# Patient Record
Sex: Male | Born: 1945 | Race: White | Hispanic: No | Marital: Married | Smoking: Former smoker
Health system: Southern US, Community
[De-identification: ages and names within clinical notes are randomized; demographics above are authoritative.]

## PROBLEM LIST (undated history)

## (undated) DIAGNOSIS — C7800 Secondary malignant neoplasm of unspecified lung: Secondary | ICD-10-CM

## (undated) DIAGNOSIS — C801 Malignant (primary) neoplasm, unspecified: Secondary | ICD-10-CM

## (undated) DIAGNOSIS — I10 Essential (primary) hypertension: Secondary | ICD-10-CM

## (undated) DIAGNOSIS — E119 Type 2 diabetes mellitus without complications: Secondary | ICD-10-CM

## (undated) DIAGNOSIS — R569 Unspecified convulsions: Secondary | ICD-10-CM

## (undated) DIAGNOSIS — I2510 Atherosclerotic heart disease of native coronary artery without angina pectoris: Secondary | ICD-10-CM

## (undated) DIAGNOSIS — M1990 Unspecified osteoarthritis, unspecified site: Secondary | ICD-10-CM

## (undated) DIAGNOSIS — Z923 Personal history of irradiation: Secondary | ICD-10-CM

## (undated) DIAGNOSIS — C649 Malignant neoplasm of unspecified kidney, except renal pelvis: Secondary | ICD-10-CM

## (undated) DIAGNOSIS — E785 Hyperlipidemia, unspecified: Secondary | ICD-10-CM

## (undated) DIAGNOSIS — E7800 Pure hypercholesterolemia, unspecified: Secondary | ICD-10-CM

## (undated) DIAGNOSIS — C7931 Secondary malignant neoplasm of brain: Secondary | ICD-10-CM

## (undated) DIAGNOSIS — R180 Malignant ascites: Secondary | ICD-10-CM

## (undated) HISTORY — PX: KNEE SURGERY: SHX244

## (undated) HISTORY — DX: Pure hypercholesterolemia, unspecified: E78.00

## (undated) HISTORY — DX: Unspecified convulsions: R56.9

## (undated) HISTORY — DX: Atherosclerotic heart disease of native coronary artery without angina pectoris: I25.10

## (undated) HISTORY — DX: Secondary malignant neoplasm of brain: C79.31

## (undated) HISTORY — DX: Malignant ascites: R18.0

## (undated) HISTORY — DX: Unspecified osteoarthritis, unspecified site: M19.90

## (undated) HISTORY — DX: Essential (primary) hypertension: I10

## (undated) HISTORY — DX: Secondary malignant neoplasm of unspecified lung: C78.00

## (undated) HISTORY — PX: HEMORROIDECTOMY: SUR656

## (undated) HISTORY — DX: Malignant neoplasm of unspecified kidney, except renal pelvis: C64.9

---

## 1999-03-26 ENCOUNTER — Encounter: Payer: Self-pay | Admitting: *Deleted

## 1999-03-26 ENCOUNTER — Emergency Department (HOSPITAL_COMMUNITY): Admission: EM | Admit: 1999-03-26 | Discharge: 1999-03-26 | Payer: Self-pay | Admitting: *Deleted

## 1999-05-14 ENCOUNTER — Encounter: Admission: RE | Admit: 1999-05-14 | Discharge: 1999-05-14 | Payer: Self-pay | Admitting: Neurology

## 2001-02-16 ENCOUNTER — Ambulatory Visit (HOSPITAL_COMMUNITY): Admission: RE | Admit: 2001-02-16 | Discharge: 2001-02-16 | Payer: Self-pay | Admitting: *Deleted

## 2001-02-16 ENCOUNTER — Encounter (INDEPENDENT_AMBULATORY_CARE_PROVIDER_SITE_OTHER): Payer: Self-pay | Admitting: *Deleted

## 2001-09-27 ENCOUNTER — Encounter: Payer: Self-pay | Admitting: *Deleted

## 2001-09-27 ENCOUNTER — Encounter: Admission: RE | Admit: 2001-09-27 | Discharge: 2001-09-27 | Payer: Self-pay | Admitting: *Deleted

## 2001-09-30 ENCOUNTER — Inpatient Hospital Stay (HOSPITAL_COMMUNITY): Admission: EM | Admit: 2001-09-30 | Discharge: 2001-10-05 | Payer: Self-pay | Admitting: *Deleted

## 2001-10-02 HISTORY — PX: CARDIAC CATHETERIZATION: SHX172

## 2001-10-19 ENCOUNTER — Encounter (HOSPITAL_COMMUNITY): Admission: RE | Admit: 2001-10-19 | Discharge: 2002-01-16 | Payer: Self-pay | Admitting: Cardiology

## 2002-01-17 ENCOUNTER — Encounter (HOSPITAL_COMMUNITY): Admission: RE | Admit: 2002-01-17 | Discharge: 2002-04-17 | Payer: Self-pay | Admitting: Cardiology

## 2002-05-04 ENCOUNTER — Encounter (HOSPITAL_COMMUNITY): Admission: RE | Admit: 2002-05-04 | Discharge: 2002-08-02 | Payer: Self-pay | Admitting: Cardiology

## 2002-08-04 ENCOUNTER — Encounter (HOSPITAL_COMMUNITY): Admission: RE | Admit: 2002-08-04 | Discharge: 2002-11-02 | Payer: Self-pay | Admitting: Cardiology

## 2002-11-03 ENCOUNTER — Encounter (HOSPITAL_COMMUNITY): Admission: RE | Admit: 2002-11-03 | Discharge: 2003-02-01 | Payer: Self-pay | Admitting: Cardiology

## 2003-02-02 ENCOUNTER — Encounter (HOSPITAL_COMMUNITY): Admission: RE | Admit: 2003-02-02 | Discharge: 2003-05-03 | Payer: Self-pay | Admitting: Cardiology

## 2003-05-05 ENCOUNTER — Encounter (HOSPITAL_COMMUNITY): Admission: RE | Admit: 2003-05-05 | Discharge: 2003-08-03 | Payer: Self-pay | Admitting: Cardiology

## 2003-08-05 ENCOUNTER — Encounter (HOSPITAL_COMMUNITY): Admission: RE | Admit: 2003-08-05 | Discharge: 2003-11-03 | Payer: Self-pay | Admitting: Cardiology

## 2003-11-04 ENCOUNTER — Encounter (HOSPITAL_COMMUNITY): Admission: RE | Admit: 2003-11-04 | Discharge: 2004-02-02 | Payer: Self-pay | Admitting: Cardiology

## 2004-02-06 ENCOUNTER — Encounter (HOSPITAL_COMMUNITY): Admission: RE | Admit: 2004-02-06 | Discharge: 2004-05-06 | Payer: Self-pay | Admitting: Cardiology

## 2004-05-07 ENCOUNTER — Encounter (HOSPITAL_COMMUNITY): Admission: RE | Admit: 2004-05-07 | Discharge: 2004-08-05 | Payer: Self-pay | Admitting: Cardiology

## 2004-08-06 ENCOUNTER — Encounter (HOSPITAL_COMMUNITY): Admission: RE | Admit: 2004-08-06 | Discharge: 2004-11-04 | Payer: Self-pay | Admitting: Cardiology

## 2004-11-05 ENCOUNTER — Encounter (HOSPITAL_COMMUNITY): Admission: RE | Admit: 2004-11-05 | Discharge: 2005-02-03 | Payer: Self-pay | Admitting: Cardiology

## 2005-02-04 ENCOUNTER — Encounter (HOSPITAL_COMMUNITY): Admission: RE | Admit: 2005-02-04 | Discharge: 2005-05-02 | Payer: Self-pay | Admitting: Cardiology

## 2005-05-04 ENCOUNTER — Encounter (HOSPITAL_COMMUNITY): Admission: RE | Admit: 2005-05-04 | Discharge: 2005-08-02 | Payer: Self-pay | Admitting: Cardiology

## 2005-08-05 ENCOUNTER — Encounter (HOSPITAL_COMMUNITY): Admission: RE | Admit: 2005-08-05 | Discharge: 2005-11-03 | Payer: Self-pay | Admitting: Cardiology

## 2005-11-04 ENCOUNTER — Encounter (HOSPITAL_COMMUNITY): Admission: RE | Admit: 2005-11-04 | Discharge: 2006-02-02 | Payer: Self-pay | Admitting: Cardiology

## 2006-02-03 ENCOUNTER — Encounter (HOSPITAL_COMMUNITY): Admission: RE | Admit: 2006-02-03 | Discharge: 2006-05-04 | Payer: Self-pay | Admitting: Cardiology

## 2006-05-05 ENCOUNTER — Encounter: Admission: RE | Admit: 2006-05-05 | Discharge: 2006-08-03 | Payer: Self-pay | Admitting: Cardiology

## 2006-08-04 ENCOUNTER — Encounter (HOSPITAL_COMMUNITY): Admission: RE | Admit: 2006-08-04 | Discharge: 2006-11-02 | Payer: Self-pay | Admitting: Cardiology

## 2006-11-03 ENCOUNTER — Encounter (HOSPITAL_COMMUNITY): Admission: RE | Admit: 2006-11-03 | Discharge: 2007-02-01 | Payer: Self-pay | Admitting: Cardiology

## 2007-01-27 ENCOUNTER — Encounter: Admission: RE | Admit: 2007-01-27 | Discharge: 2007-04-14 | Payer: Self-pay | Admitting: Family Medicine

## 2007-03-29 ENCOUNTER — Ambulatory Visit (HOSPITAL_BASED_OUTPATIENT_CLINIC_OR_DEPARTMENT_OTHER): Admission: RE | Admit: 2007-03-29 | Discharge: 2007-03-29 | Payer: Self-pay | Admitting: Orthopedic Surgery

## 2007-05-05 ENCOUNTER — Encounter (HOSPITAL_COMMUNITY): Admission: RE | Admit: 2007-05-05 | Discharge: 2007-08-03 | Payer: Self-pay | Admitting: Pediatrics

## 2007-08-05 ENCOUNTER — Encounter (HOSPITAL_COMMUNITY): Admission: RE | Admit: 2007-08-05 | Discharge: 2007-10-05 | Payer: Self-pay | Admitting: Cardiology

## 2010-12-17 NOTE — Op Note (Signed)
NAME:  George [name], George [name]                ACCOUNT NO.:  [id number]   MEDICAL RECORD NO.:  [id number]          PATIENT TYPE:  AMB   LOCATION:  DSC                          FACILITY:  [facility]   PHYSICIAN:  Robert A. Thurston Hole, M.D. 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   PROCEDURES:  1. Left knee examination under anesthesia, followed by arthroscopic      partial medial and lateral meniscectomies.  2. Left knee chondroplasty.   SURGEON:  Elana Alm. Thurston Hole, M.D.   ASSISTANT:  Julien Girt, P.A.   ANESTHESIA:  Local with MAC.   OPERATIVE TIME:  30 minutes.   COMPLICATIONS:  None.   INDICATION FOR PROCEDURE:  [name] is a 65 year old gentleman who has  had 3-4 months of increasing left knee pain with exam and MRI  documenting meniscal tearing with chondromalacia.  He has failed  conservative care and is now to undergo arthroscopy.   DESCRIPTION:  [name] was brought to operating room on March 29, 2007, after a knee block was placed in the holding room by anesthesia.  He was placed on the operating room table in supine position.  His left  knee was examined.  He had full range of motion.  His knee was stable to  ligamentous exam.  His left leg was prepped using sterile DuraPrep and  draped using sterile technique.  Originally through an anterolateral  portal the arthroscope with a pump attached was placed into an  anteromedial portal and an arthroscopic probe was placed.  On initial  inspection of the medial compartment, he was found to have a 60-70%  grade 3 chondromalacia on the medial femoral condyle, which was  debrided.  Medial tibial plateau was intact.  Medial meniscus tear,  posterior and medial  horn, of which 30-40% was resected back to a stable  rim.  Intercondylar notch inspected.  Anterior and posterior cruciate  ligaments were normal.  Lateral compartment inspected.  Articular  cartilage was normal.  Lateral meniscus showed a small posterior medial  root tear of 20%, which was resected back to a stable rim.  Patellofemoral joint showed grade 1 and 2 chondromalacia.  The patella  tracked normally.  Medial and lateral gutters were free of pathology.  After this was done, it was felt that all pathology been satisfactorily  addressed.  Instruments were removed.  Portals were closed with 3-0  nylon suture and injected with 0.25% Marcaine with epinephrine and 4 mg  of morphine.  Sterile dressings were applied and the patient awakened  and taken to the recovery room in stable condition.   FOLLOW-UP CARE:  [name] will be followed as an outpatient on Vicodin  and Mobic.  See him back in the office in a week for sutures out and  follow-up.      Robert A. Thurston Hole,  M.D.  Electronically Signed     RAW/MEDQ  D:  03/29/2007  T:  03/30/2007  Job:  [id number]

## 2010-12-20 NOTE — Discharge Summary (Signed)
[place]  Patient:    George [name], George [name] Visit Number: [id number] MRN: [id number]          Service Type: MED Location: [phone] Attending Physician:  Armanda Magic Dictated by:   Anselm Lis, N.P. Admit Date:  09/30/2001 Discharge Date: 10/05/2001   CC:         Lester Kinsman, M.D.   Discharge Summary  DATE OF BIRTH:  1946/03/02  PROCEDURES: 1. (October 04, 3001) Coronary angiography by Dr. Armanda Magic revealing distal     left anterior descending 60%, a 100% mid occluded small diagonal-1, 99%     proximal large obtuse marginal with luminal irregularities up to 40% mid     circumflex, 80-90% distal right coronary artery.  Ejection fraction low     normal at 50% with mild hypokinesis of the interior lateral wall. 2. (October 04, 2001) Stent obtuse marginal and stent to distal right coronary    artery by Dr. Verdis Prime. 3. Stress Cardiolite (October 01, 2001) revealing large area of ischemia in    the inferolateral wall with some associated scar formation particularly in    the anterolateral wall.  Ejection fraction 47%.  DISCHARGE DIAGNOSES: 1. Coronary atherosclerotic heart disease; two vessel.    a. Coronary angiography by Dr. Armanda Magic with results detailed above.    b. Percutaneous transluminal coronary angioplasty/stent of both the obtuse       marginal and distal right coronary artery by Dr. Verdis Prime.    c. Ruled out myocardial infarction by serial cardiac enzymes, although       slightly elevated troponin I of 0.11 to 0.12.    d. The patients angina is left arm numbness and left anterior shoulder       discomfort. 2. Diabetes mellitus has been diet controlled, diagnosed approximately nine    years earlier.  His hemoglobin A1C was elevated at 9.5.  Fasting glucoses    319 and 254.  Blood glucoses ranging from 153-70 covered by sliding scale    this admission.  He has already been through diabetic education classes in    the past and defers further referral.  He will follow up with    Dr. Ulyess Mort in the next couple of days. 3. Hyperlipidemia in the setting of coronary artery disease.  Cholesterol    fasting of 242 with triglycerides 224, high-density lipoprotein 35,    low-density lipoprotein 162.  He was started on Zocor 20 mg p.o. q.h.s. and    will have fasting Statin profile in approximately six weeks at our clinic. 4. Left ventricular dysfunction; ischemic.  Ejection fraction low normal at    about 50%.  Preference would be to start on angiotensin-converting enzyme    inhibitor in the setting of ejection fraction and diabetes, but his blood    pressure has been borderline low, so we will defer on this until blood    pressure improves.  Will similarly not start beta-blocker secondary to low    heart rate in the 50s and 60s. 5. Ischemic left ventricular dysfunction with left ventriculogram at the time    of heart catheterization revealing ejection fraction approximately 50% with    mild hypokinesis of the anterolateral wall. 6. Left shoulder pain.  Shoulder x-ray films suggest etiology of bone spur. 7. Ongoing tobacco abuse.  He was counseled to stop smoking.  Uncertain as to    his commitment to this. 8. Sinus bradycardia, nocturnal, with pause  as long as 2.04 to 3.12 seconds.    The patient was asymptomatic.  It appeared like a sinus rush with an atrial    pacemaker escape rhythm.  CONDITION ON DISCHARGE:  The patient was discharged to home in stable condition.  DISCHARGE MEDICATIONS: 1. (New) Zocor 20 mg p.o. q.h.s. 2. Enteric coated aspirin 325 mg once daily. 3. (New) Plavix 75 mg one p.o. q.d. x4 weeks, take with food. 4. (New) Nitroglycerin tablets 0.4 mg sublingual p.r.n. chest pain up to three    tablets in 15 minutes.  ACTIVITY:  As outlined by cardiac rehabilitation phase I.  Anticipate enrollment in cardiac rehabilitation phase II as an outpatient.  No heavy lifting or pushing, no  driving today or tomorrow and then activity as before.  DIET:  Low fat, low cholesterol, no added salt recommended.  WOUND CARE:  The patient may shower.  He is to call our clinic if he develops a large amount of swelling or bruising in groin area.  FOLLOWUP:  Follow up with Dr. Ulyess Mort, phone [phone], at 9 a.m. on March 6, this Thursday.  Follow up with Dr. Armanda Magic on Tuesday, March 18, at 11:45 a.m.  Fasting Statin profile on Tuesday, April 15, between 8 a.m. and noon, third floor, [address] at our office.  HISTORY OF PRESENT ILLNESS:  Mr. George [name] is a very pleasant, 65 year old gentleman with history of diet-controlled diabetes mellitus, ongoing tobacco abuse who presented to the hospital with complaints of left shoulder pain, left anterior chest discomfort and left arm numbness.  He was admitted and his first troponin I was elevated at 0.11, although CK-MB were negative serially. His amylase and lipase were in the normal range.  His admission EKG was nonischemic.  He underwent stress Cardiolite significant for reproduction of the left anterior shoulder discomfort and left arm numbness with exertion with associated EKG changes of ischemia in lateral/precordial leads.  Discomfort was relieved with rest and two sublingual nitroglycerin.  The patient is counseled to undergo coronary angiography which he agreed to.  He had to stay over the weekend and physically underwent coronary angiography on Monday, March 3, with results as detailed above.  Ultimately, PTCA/stent in both the OM and distal RCA.  The rest of the hospital course was uneventful and he was discharged to home in stable condition.  LABORATORY DATA AND X-RAY FINDINGS:  Admission WBC 6.0, hemoglobin 16.7, hematocrit 47.8, differential all within normal range, platelets 180.  ESR of 2.  Protime 13.2, INR 1, PTT of 30.  Sodium 137, potassium 4.1, chloride 104,  CO2 25, glucose 319, BUN 15, creatinine 0.6.  LFTs  all within normal limits as were the amylase and lipase.  Hemoglobin A1C elevated at 9.5.  First CK of 36, MB fraction 1.6, troponin I of 0.11.  Second CK of 54, MB fraction 1.2, troponin I 0.12.  Third CK 38, MB fraction 1.0.  Fasting Statin profile with cholesterol 242, triglycerides 224, HDL 35, LDL 162.  Initial portable chest x-ray revealed a 10 x 5 mm right upper lobe nodule suspicious for calcified granuloma.  A followup PA and lateral chest x-ray revealed an apparently calcified right upper lobe nodular density at 7 x 3 mm granuloma.  Thoracic spine degenerative changes again demonstrated.  Two-view shoulder films revealed mild to moderate inferior clavicular spur formation at the acromioclavicular joint.  This could be causing underlying rotator cuff impingement.  Total time of this dictation was greater than 45 minutes including filling  out and reviewing discharge instructions with patient and dictating the discharge summary as well as calling in prescriptions. Dictated by:   Anselm Lis, N.P. Attending Physician:  Armanda Magic DD:  10/05/01 TD:  10/06/01 Job: [id number]

## 2010-12-20 NOTE — Cardiovascular Report (Signed)
. [facility]  Patient:    George [name], George [name] Visit Number: [id number] MRN: [id number]          Service Type: MED Location: 6500 [phone] Attending Physician:  Armanda Magic Dictated by:   Armanda Magic, M.D. Proc. Date: 10/04/01 Admit Date:  09/30/2001   CC:         Lester Kinsman, M.D.   Cardiac Catheterization  REFERRING PHYSICIAN:  Lester Kinsman, M.D.  CHIEF COMPLAINT:  This is a very pleasant 65 year old white male who presented to the hospital with complaints of shoulder pain over the past year but has been increasing in severity recently.  He also had some chest pain over the past several days.  Troponins were slightly elevated.  CPK-MB was normal. Stress Cardiolite study showed a large area of inducible ischemia in the inferolateral wall.  He now presents for cardiac catheterization.  PROCEDURE:  Left heart catheterization, coronary angiography, left ventriculography.  OPERATOR:  Armanda Magic, M.D.  INDICATIONS:  Chest pain, left arm pain, abnormal Cardiolite.  COMPLICATIONS:  None.  IV ACCESS:  Via right femoral artery, #6 French sheath.  DESCRIPTION OF PROCEDURE:  The patient was brought to the cardiac catheterization laboratory in the fasting nonsedated state.  Informed consent was obtained.  The patient was connected to continuous heart rate and pulse oximetry monitoring and intermittent blood pressure monitoring.  The right groin was prepped and draped in a sterile fashion.  Xylocaine, 1%, was used for local anesthesia.  Using the modified Seldinger technique, a #6 French sheath was placed in the right femoral artery.  Under fluoroscopic guidance, a #6 [place] JR4 catheter was placed in the left coronary artery.  Multiple cine films were taken in a 30-degree RAO, 40-degree LAO view.  This catheter was then exchanged out for a #6 [place] JR4 catheter which was placed in the right coronary artery under fluoroscopic guidance.   Multiple cine films were taken in a 30-degree RAO, 40-degree LAO view.  This catheter was then exchanged out over a guidewire for a #6 French angled pigtail catheter which was placed under fluoroscopic guidance in the left ventricular cavity.  Left ventriculography was performed in the 30-degree RAO view with a total of 30 cc of contrast at 13 cc per second.  At the end of the procedure, the patient went on to PCI of the distal right coronary artery and the obtuse marginal branch.  RESULTS: 1. The left main coronary artery is widely patent and bifurcates into a left    anterior descending artery and left circumflex artery.  2. The left anterior descending artery gives rise to a first diagonal which is    occluded proximally.  It gives rise to a second diagonal which is widely    patent.  The LAD is widely patent throughout its course until distally.  It    tapers into a 60% narrowing.  3. The left circumflex is patent throughout the course with luminal    irregularities up to 40% in the mid and distal portion of the vessel.    There is a very large obtuse marginal that comes off which is a very large    branch and has a 90% proximal stenosis prior to bifurcating into two    daughter vessels of which are widely patent.  Of note, the posterolateral    artery comes off the distal left circumflex.  4. The right coronary artery is widely patent throughout its course and    distally has  an 80-90% stenosis prior to terminating in the posterior    descending artery.  LEFT VENTRICULOGRAPHY:  Left ventriculography performed in the 30-degree RAO view using a total of 30 cc of contrast at 13 cc per second showed mild LV dysfunction.  There was mild hypokinesis of the anterolateral wall.  EF approximately 50%.  HEMODYNAMIC DATA: 1. Aortic pressure 106/60 mmHg. 2. Left ventricular pressure was 107/20 mmHg.  ASSESSMENT: 1. Two-vessel obstructive coronary disease. 2. Mild anterolateral  ischemia. 3. Low normal ejection fraction of 50%. 4. Hyperlipidemia with an LDL of 162 and HDL 35.  Patient was started on Zocor    in the hospital. 5. Diabetes mellitus, questionably diet-controlled although blood sugar was    300 when he came in.  PLAN:  PCI per Dr. Katrinka Blazing of the distal right coronary artery and obtuse marginal branch.  Continue aspirin and Plavix.  Continue Zocor and check a hemoglobin A1C. Dictated by:   Armanda Magic, M.D. Attending Physician:  Armanda Magic DD:  10/04/01 TD:  10/04/01 Job: [id number]

## 2011-05-16 LAB — I-STAT 8, (EC8 V) (CONVERTED LAB)
Chloride: 106
Glucose, Bld: 153 — ABNORMAL HIGH
HCT: 46
Potassium: 4.2
Sodium: 140
TCO2: 27
pH, Ven: 7.386 — ABNORMAL HIGH

## 2013-03-24 ENCOUNTER — Other Ambulatory Visit: Payer: Self-pay | Admitting: Cardiology

## 2013-03-28 ENCOUNTER — Emergency Department (HOSPITAL_COMMUNITY): Payer: Medicare Other

## 2013-03-28 ENCOUNTER — Inpatient Hospital Stay (HOSPITAL_COMMUNITY)
Admission: EM | Admit: 2013-03-28 | Discharge: 2013-04-05 | DRG: 054 | Disposition: A | Discharge status: Home / Self Care | Payer: Medicare Other | Attending: Internal Medicine | Admitting: Internal Medicine

## 2013-03-28 ENCOUNTER — Encounter (HOSPITAL_COMMUNITY): Payer: Self-pay | Admitting: *Deleted

## 2013-03-28 DIAGNOSIS — N2889 Other specified disorders of kidney and ureter: Secondary | ICD-10-CM | POA: Diagnosis present

## 2013-03-28 DIAGNOSIS — I639 Cerebral infarction, unspecified: Secondary | ICD-10-CM

## 2013-03-28 DIAGNOSIS — I6350 Cerebral infarction due to unspecified occlusion or stenosis of unspecified cerebral artery: Secondary | ICD-10-CM

## 2013-03-28 DIAGNOSIS — E785 Hyperlipidemia, unspecified: Secondary | ICD-10-CM | POA: Diagnosis present

## 2013-03-28 DIAGNOSIS — R4182 Altered mental status, unspecified: Secondary | ICD-10-CM

## 2013-03-28 DIAGNOSIS — Z9861 Coronary angioplasty status: Secondary | ICD-10-CM

## 2013-03-28 DIAGNOSIS — G9340 Encephalopathy, unspecified: Secondary | ICD-10-CM | POA: Diagnosis present

## 2013-03-28 DIAGNOSIS — R569 Unspecified convulsions: Secondary | ICD-10-CM

## 2013-03-28 DIAGNOSIS — R32 Unspecified urinary incontinence: Secondary | ICD-10-CM | POA: Diagnosis present

## 2013-03-28 DIAGNOSIS — IMO0002 Reserved for concepts with insufficient information to code with codable children: Secondary | ICD-10-CM | POA: Diagnosis present

## 2013-03-28 DIAGNOSIS — I2510 Atherosclerotic heart disease of native coronary artery without angina pectoris: Secondary | ICD-10-CM | POA: Diagnosis present

## 2013-03-28 DIAGNOSIS — C649 Malignant neoplasm of unspecified kidney, except renal pelvis: Secondary | ICD-10-CM | POA: Diagnosis present

## 2013-03-28 DIAGNOSIS — C7800 Secondary malignant neoplasm of unspecified lung: Secondary | ICD-10-CM | POA: Diagnosis present

## 2013-03-28 DIAGNOSIS — C778 Secondary and unspecified malignant neoplasm of lymph nodes of multiple regions: Secondary | ICD-10-CM | POA: Diagnosis present

## 2013-03-28 DIAGNOSIS — W07XXXA Fall from chair, initial encounter: Secondary | ICD-10-CM | POA: Diagnosis present

## 2013-03-28 DIAGNOSIS — I214 Non-ST elevation (NSTEMI) myocardial infarction: Secondary | ICD-10-CM | POA: Diagnosis present

## 2013-03-28 DIAGNOSIS — C7931 Secondary malignant neoplasm of brain: Principal | ICD-10-CM

## 2013-03-28 DIAGNOSIS — E119 Type 2 diabetes mellitus without complications: Secondary | ICD-10-CM | POA: Diagnosis present

## 2013-03-28 DIAGNOSIS — G936 Cerebral edema: Secondary | ICD-10-CM | POA: Diagnosis present

## 2013-03-28 HISTORY — DX: Type 2 diabetes mellitus without complications: E11.9

## 2013-03-28 HISTORY — DX: Atherosclerotic heart disease of native coronary artery without angina pectoris: I25.10

## 2013-03-28 HISTORY — DX: Hyperlipidemia, unspecified: E78.5

## 2013-03-28 LAB — COMPREHENSIVE METABOLIC PANEL
ALT: 19 U/L (ref 0–53)
AST: 17 U/L (ref 0–37)
Alkaline Phosphatase: 96 U/L (ref 39–117)
CO2: 25 mEq/L (ref 19–32)
Chloride: 102 mEq/L (ref 96–112)
GFR calc Af Amer: >90 mL/min (ref >90)
GFR calc non Af Amer: 88 mL/min — ABNORMAL LOW (ref >90)
Glucose, Bld: 241 mg/dL — ABNORMAL HIGH (ref 70–99)
Potassium: 4.4 mEq/L (ref 3.5–5.1)
Sodium: 137 mEq/L (ref 135–145)
Total Bilirubin: 0.3 mg/dL (ref 0.3–1.2)

## 2013-03-28 LAB — CBC WITH DIFFERENTIAL/PLATELET
Basophils Absolute: 0 10*3/uL (ref 0.0–0.1)
Eosinophils Relative: 1 % (ref 0–5)
HCT: 34.2 % — ABNORMAL LOW (ref 39.0–52.0)
Lymphocytes Relative: 9 % — ABNORMAL LOW (ref 12–46)
Lymphs Abs: 0.9 10*3/uL (ref 0.7–4.0)
MCV: 84.4 fL (ref 78.0–100.0)
Neutro Abs: 8.8 10*3/uL — ABNORMAL HIGH (ref 1.7–7.7)
Platelets: 210 10*3/uL (ref 150–400)
RBC: 4.05 MIL/uL — ABNORMAL LOW (ref 4.22–5.81)
RDW: 13.7 % (ref 11.5–15.5)
WBC: 10.7 10*3/uL — ABNORMAL HIGH (ref 4.0–10.5)

## 2013-03-28 LAB — GLUCOSE, CAPILLARY: Glucose-Capillary: 173 mg/dL — ABNORMAL HIGH (ref 70–99)

## 2013-03-28 MED ORDER — ONDANSETRON HCL 4 MG/2ML IJ SOLN: 4.0000 mg | Freq: Three times a day (TID) | INTRAMUSCULAR | Status: DC | PRN

## 2013-03-28 MED ORDER — SODIUM CHLORIDE 0.9 % IV SOLN
500.0000 mg | Freq: Two times a day (BID) | INTRAVENOUS | Status: DC
  Administered 2013-03-29 (×3): 500 mg via INTRAVENOUS
  Filled 2013-03-28 (×6): qty 5

## 2013-03-28 MED ORDER — FENTANYL CITRATE 0.05 MG/ML IJ SOLN
25.0000 ug | Freq: Once | INTRAMUSCULAR | Status: AC
  Administered 2013-03-28: 25 ug via INTRAVENOUS
  Filled 2013-03-28: qty 2

## 2013-03-28 MED ORDER — DEXAMETHASONE SODIUM PHOSPHATE 10 MG/ML IJ SOLN
6.0000 mg | Freq: Four times a day (QID) | INTRAMUSCULAR | Status: DC
  Administered 2013-03-29 – 2013-04-01 (×15): 6 mg via INTRAVENOUS
  Filled 2013-03-28 (×11): qty 0.6
  Filled 2013-03-28: qty 1.5
  Filled 2013-03-28 (×3): qty 0.6
  Filled 2013-03-28: qty 1.5
  Filled 2013-03-28 (×2): qty 0.6

## 2013-03-28 MED ORDER — LORAZEPAM 2 MG/ML IJ SOLN: 1.0000 mg | Freq: Once | INTRAMUSCULAR | Status: DC

## 2013-03-28 MED ORDER — DEXAMETHASONE SODIUM PHOSPHATE 10 MG/ML IJ SOLN
10.0000 mg | Freq: Once | INTRAMUSCULAR | Status: AC
  Administered 2013-03-28: 10 mg via INTRAVENOUS
  Filled 2013-03-28: qty 1

## 2013-03-28 MED ORDER — SODIUM CHLORIDE 0.9 % IV SOLN
1000.0000 mg | Freq: Once | INTRAVENOUS | Status: AC
  Administered 2013-03-28: 1000 mg via INTRAVENOUS
  Filled 2013-03-28: qty 10

## 2013-03-28 MED ORDER — LORAZEPAM 2 MG/ML IJ SOLN
INTRAMUSCULAR | Status: AC
  Administered 2013-03-28: 1 mg
  Filled 2013-03-28: qty 1

## 2013-03-28 NOTE — Consult Note (Signed)
Neurology Consultation Reason for Consult: Seizure Referring Physician: Glendora [name]  CC: Seizure  History is obtained from: Wife  HPI: George [name] is a 67 y.o. male with a history of several days of difficulty finding his words, and forgetting what he was going to say. Today, he was in his normal state of health around 2 PM and subsequently his wife returned at 6 PM and found him on the floor unresponsive. She states that by the time EMS got there he had begun moving, but it took another half hour or so before he had return of speech. He was brought into the emergency room and after arrival here, was seen to have a focal seizure with right eye deviation and tonic clonic activity. This aborted with Ativan. He has since had some improvement in his mental status but still not back to baseline.    ROS: A 14 point ROS was performed and is negative except as noted in the HPI.  History reviewed. No pertinent past medical history.  Family History: No history of seizures  Social History: Tob: Daily smoker  Exam: Current vital signs: BP 132/64  Pulse 89  Temp(Src) 98.5 F (36.9 C) (Oral)  Resp 21  SpO2 98% Vital signs in last 24 hours: Temp:  [98.5 F (36.9 C)-98.7 F (37.1 C)] 98.5 F (36.9 C) (08/25 2253) Pulse Rate:  [89-106] 89 (08/25 2253) Resp:  [19-33] 21 (08/25 2253) BP: (105-147)/(64-79) 132/64 mmHg (08/25 2253) SpO2:  [89 %-100 %] 98 % (08/25 2253)  General: In bed, c-collar and nonrebreather in place CV: Regular rate and rhythm Mental Status: Patient opens eyes to voice, he fixates and tracks but does not follow commands. He does not have any verbal output. Cranial Nerves: II: Blinks to threat from both directions. Pupils are equal, round, and reactive to light.  Discs are difficult to visualize. III,IV, VI: Pulses midline both directions when tracking. V: Facial sensation is symmetric to temperature VII: Facial movement is symmetric.  VIII: hearing is intact to  voice X: Uvula elevates symmetrically XI: Shoulder shrug is symmetric. XII: tongue is midline without atrophy or fasciculations.  Motor: Tone is normal. Bulk is normal. He does not comply with formal testing, however he is very purposeful reaching for his nonrebreather and repositioning himself with both sides. Sensory: Response to noxious stimuli in all 4 extremities Deep Tendon Reflexes: 2+ and symmetric in the biceps and patellae.  Plantars: Toes are downgoing bilaterally.  Cerebellar: Does not comply with testing Gait: Not tested due to patient safety concerns   I have reviewed labs in epic and the results pertinent to this consultation are: Elevated glucose Mild leukocytosis  I have reviewed the images obtained: Large area of edema in the left frontal region. There is an area in the deep white matter that appears to me to be encapsulated mass.  Impression: 67 year old male with likely mass lesion in the right frontal region. No fevers or headaches to suggest infectious process. At this time I feel that an MRI with and without contrast to further delineate this mass is needed. In the interim, I will start steroids for presumed vasogenic edema.  Recommendations: 1) MRI brain with and without contrast 2) Decadron 10 mg x1 then 6 mg every 6 hours 3) Keppra 1000 mg x1 then 500 mg twice a day 4) for recurrent seizures, which is lorazepam 1-2 mg IV 5) EEG   Ritta Slot, MD [facility] [phone]  If 7pm- 7am, please page neurology on call  at [phone].

## 2013-03-28 NOTE — ED Provider Notes (Signed)
CSN: [id number]     Arrival date & time 03/28/13  1824 History   First MD Initiated Contact with Patient 03/28/13 1827     Chief Complaint  Patient presents with  . Altered Mental Status  . Fall   (Consider location/radiation/quality/duration/timing/severity/associated sxs/prior Treatment) HPI Comments: 67 y.o. M with a PMH of CAD s/p PCI, scheduled for cardiac cath tomorrow AM due to abnormal nuclear stress test this week, presenting with AMS and syncope.  Wife reports that pt was his usual self when she saw him at 2 pm (5 hours prior to presentation).  She returned home at 6 pm to find pt face down on floor.  Pt awoke when she shook him and she was able to sit him in a chair before EMS arrived.  On EMS arrival, pt alert but with difficulty speaking.  VSS en route.    Wife states that she has noticed cognitive decline in pt over past 2 weeks, citing word finding difficulty and forgetfulness.    Patient is a 67 y.o. male presenting with altered mental status and fall. The history is provided by the EMS personnel and the spouse.  Altered Mental Status Presenting symptoms comment:  Found face down at home at 16:00;  Alert but unable to speak on EMS arrival; Last seen normal at 14:00 Severity:  Severe Most recent episode:  Today Episode history:  Continuous Duration: last seen normal at 14:00. Timing:  Constant Progression:  Unchanged Chronicity:  New Context: not a nursing home resident, not a recent change in medication and not a recent illness   Associated symptoms: no abdominal pain, no fever, no headaches, no light-headedness, no nausea, no palpitations, no visual change, no vomiting and no weakness   Associated symptoms comment:  Aphasia, confusion Fall This is a new problem. The current episode started today. Episode frequency: once. The problem has been unchanged. Associated symptoms include neck pain. Pertinent negatives include no abdominal pain, chest pain, chills, coughing,  diaphoresis, fatigue, fever, headaches, nausea, numbness, vertigo, visual change, vomiting or weakness. Nothing aggravates the symptoms. He has tried nothing for the symptoms. The treatment provided no relief.    History reviewed. No pertinent past medical history. History reviewed. No pertinent past surgical history. No family history on file. History  Substance Use Topics  . Smoking status: Not on file  . Smokeless tobacco: Not on file  . Alcohol Use: Not on file    Review of Systems  Constitutional: Negative for fever, chills, diaphoresis, appetite change and fatigue.  HENT: Positive for neck pain. Negative for neck stiffness.   Eyes: Negative for photophobia and visual disturbance.  Respiratory: Negative for cough, chest tightness, shortness of breath and wheezing.   Cardiovascular: Negative for chest pain, palpitations and leg swelling.  Gastrointestinal: Negative for nausea, vomiting, abdominal pain and abdominal distention.  Genitourinary: Negative for dysuria.  Musculoskeletal: Negative for back pain.  Neurological: Positive for syncope and speech difficulty. Negative for dizziness, vertigo, facial asymmetry, weakness, light-headedness, numbness and headaches.  All other systems reviewed and are negative.    Allergies  Review of patient's allergies indicates no known allergies.  Home Medications  No current outpatient prescriptions on file. BP 132/64  Pulse 89  Temp(Src) 98.5 F (36.9 C) (Oral)  Resp 21  SpO2 98% Physical Exam  Nursing note and vitals reviewed. Constitutional: He appears well-developed and well-nourished. No distress.  HENT:  Head: Normocephalic and atraumatic. Head is without Battle's sign, without laceration, without right periorbital erythema and without  left periorbital erythema.    Eyes: EOM are normal. Pupils are equal, round, and reactive to light.  Neck: Normal range of motion. Neck supple.  Cardiovascular: Normal rate, regular rhythm,  normal heart sounds and intact distal pulses.   Pulmonary/Chest: Effort normal and breath sounds normal. No respiratory distress. He has no wheezes. He has no rales.  Abdominal: Soft. Bowel sounds are normal. He exhibits no distension. There is no tenderness. There is no rebound and no guarding.  Musculoskeletal: Normal range of motion. He exhibits no edema.       Cervical back: He exhibits tenderness and bony tenderness. He exhibits no edema and no deformity.       Thoracic back: He exhibits normal range of motion, no tenderness, no bony tenderness, no edema and no deformity.       Lumbar back: He exhibits normal range of motion, no tenderness, no bony tenderness, no edema and no deformity.  Neurological: He is alert. He has normal strength. He is disoriented (oriented to person and place only). No cranial nerve deficit or sensory deficit. He exhibits normal muscle tone. He displays a negative Romberg sign. Coordination normal. GCS eye subscore is 4. GCS verbal subscore is 5. GCS motor subscore is 6.  Able to state name and location;  Unable to verbalize response to other questions  Skin: Skin is warm and dry. No rash noted. He is not diaphoretic.    ED Course  Procedures (including critical care time) Labs Review Labs Reviewed  CBC WITH DIFFERENTIAL - Abnormal; Notable for the following:    WBC 10.7 (*)    RBC 4.05 (*)    Hemoglobin 12.0 (*)    HCT 34.2 (*)    Neutrophils Relative % 83 (*)    Neutro Abs 8.8 (*)    Lymphocytes Relative 9 (*)    All other components within normal limits  COMPREHENSIVE METABOLIC PANEL - Abnormal; Notable for the following:    Glucose, Bld 241 (*)    BUN 27 (*)    Calcium 10.7 (*)    Albumin 3.4 (*)    GFR calc non Af Amer 88 (*)    All other components within normal limits  TROPONIN I - Abnormal; Notable for the following:    Troponin I 1.23 (*)    All other components within normal limits  GLUCOSE, CAPILLARY - Abnormal; Notable for the following:      Glucose-Capillary 173 (*)    All other components within normal limits  URINALYSIS, ROUTINE W REFLEX MICROSCOPIC   Imaging Review Dg Chest 2 View  03/28/2013   *RADIOLOGY REPORT*  Clinical Data: Altered mental status, fall  CHEST - 2 VIEW  Comparison: None.  Findings: Cardiac leads overlie the chest.  Heart size is normal. Lung volumes are low but clear.  No pleural effusion.  No acute osseous finding. Mild central bronchial wall thickening noted.  IMPRESSION: No focal pulmonary opacity.  Mild central bronchial wall thickening which may indicate reactive airways disease or bronchitis.   Original Report Authenticated By: Conchita Paris, M.D.   Ct Head Wo Contrast  03/28/2013   *RADIOLOGY REPORT*  Clinical Data:  Altered mental status and fall  CT HEAD WITHOUT CONTRAST CT CERVICAL SPINE WITHOUT CONTRAST  Technique:  Multidetector CT imaging of the head and cervical spine was performed following the standard protocol without intravenous contrast.  Multiplanar CT image reconstructions of the cervical spine were also generated.  Comparison:  CT 03/26/1999 is not digitized.  Report is  reviewed.  CT HEAD  Findings: There is a large area of abnormal hypo attenuation within the gray matter and extending to the cortical surface in the left frontoparietal lobe region.  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  No acute hemorrhage is seen.  No sulcal effacement.  No midline shift.  No skull fracture. Orbits and paranasal sinuses are intact.  IMPRESSION: Abnormal area of predominately white matter left frontoparietal lobe hypodensity, which may indicate acute versus subacute infarct. Infiltrative neoplasm such as glioblastoma multiforme is possible but considered less likely given absence of focal effacement.  No acute hemorrhage.  CT CERVICAL SPINE  Findings: C1 through the cervical thoracic junction is visualized in its entirety. No precervical soft tissue widening is present. No  fracture or dislocation.  Alignment is normal.  IMPRESSION: No acute cervical spine abnormality.  Critical Value/emergent results were called by telephone at the time of interpretation on 03/28/2013 at 8:25 p.m. to Dr. Regenia Skeeter, who verbally acknowledged these results.   Original Report Authenticated By: Conchita Paris, M.D.   Ct Cervical Spine Wo Contrast  03/28/2013   *RADIOLOGY REPORT*  Clinical Data:  Altered mental status and fall  CT HEAD WITHOUT CONTRAST CT CERVICAL SPINE WITHOUT CONTRAST  Technique:  Multidetector CT imaging of the head and cervical spine was performed following the standard protocol without intravenous contrast.  Multiplanar CT image reconstructions of the cervical spine were also generated.  Comparison:  CT 03/26/1999 is not digitized.  Report is reviewed.  CT HEAD  Findings: There is a large area of abnormal hypo attenuation within the gray matter and extending to the cortical surface in the left frontoparietal lobe region.  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  No acute hemorrhage is seen.  No sulcal effacement.  No midline shift.  No skull fracture. Orbits and paranasal sinuses are intact.  IMPRESSION: Abnormal area of predominately white matter left frontoparietal lobe hypodensity, which may indicate acute versus subacute infarct. Infiltrative neoplasm such as glioblastoma multiforme is possible but considered less likely given absence of focal effacement.  No acute hemorrhage.  CT CERVICAL SPINE  Findings: C1 through the cervical thoracic junction is visualized in its entirety. No precervical soft tissue widening is present. No fracture or dislocation.  Alignment is normal.  IMPRESSION: No acute cervical spine abnormality.  Critical Value/emergent results were called by telephone at the time of interpretation on 03/28/2013 at 8:25 p.m. to Dr. Regenia Skeeter, who verbally acknowledged these results.   Original Report Authenticated By: Conchita Paris, M.D.    MDM   1. Stroke   2. Altered mental status   3. Seizure    67 y.o. M with a PMH of CAD s/p PCI, scheduled for cardiac cath tomorrow AM due to abnormal nuclear stress test this week, presenting with AMS, syncope, and fall with head injury.  On arrival, pt tachycardic to 106, normotensive.  Pt hypoxic to 89% on arrival, improved to 95% with 2L O2 via [place].  Pt alert, GCS 15.  Oriented to person, place only. Unable to recall date, month, or year.  Wife states pt is usually able to recall month and year.  Pt states he recalls falling out of a chair and hitting R side of head, states he then lost consciousness.  He is unable to recall how he fell or if any symptoms prior to fall. Contusion R tempo-parietal area of scalp with superficial abrasion to R ear.  +C-spine TTP.  Neuro exam with no  focal deficits.  Exam otherwise WNL.  Per wife pt takes only ASA, not on coumadin.  Will check CT head and c-spine.  Also workup for syncope with EKG, troponin, CXR, U/A, CBC, CMP.  20:30:  Pt had seizure lasting approximately 3 mins involving eye deviation, generalized convulsions, and increased secretions.  Airway maintained throughout episode assisted by BVM.  Pt maintaining airway during post-ictal period. CT results show large hypodensity in L frontoparietal lobe- possible infarct vs mass.  Lab results also significant for troponin elevated to 1.23.  Neuro called for evaluation- pt given keppra load and decadron.  Will monitor airway.  Pt continues to maintain airway while on O2 via [place].  Pt to be admitted for further workup by Neuro.  Discussed elevated troponin with cardiology- cannot heparinize at this time given possible infarct or mass on CT head.  They will continue to follow pt while admitted.  Stable at time of transfer.  Discussed with attending Dr. Criss Alvine.  Jodean Lima, MD 03/29/13 [phone]

## 2013-03-28 NOTE — ED Notes (Signed)
Pt last seen normal at 1400 while wife was home for lunch. Wife returned home at 1800 and found Pt prone on floor . Wife reported to EMS staff the Pt was Hard to wake up. Pt was able to sit up in chair before EMS arrived and was alert but had difficulty speaking, Pt known HX of cardiac stents and was to have a cardiac cath tomorrow.

## 2013-03-29 ENCOUNTER — Encounter (HOSPITAL_COMMUNITY)
Admission: EM | Disposition: A | Discharge status: Home / Self Care | Payer: Self-pay | Source: Home / Self Care | Attending: Internal Medicine

## 2013-03-29 ENCOUNTER — Inpatient Hospital Stay (HOSPITAL_BASED_OUTPATIENT_CLINIC_OR_DEPARTMENT_OTHER): Admission: RE | Admit: 2013-03-29 | Payer: Medicare Other | Source: Ambulatory Visit | Admitting: Cardiology

## 2013-03-29 ENCOUNTER — Encounter (HOSPITAL_COMMUNITY): Payer: Self-pay | Admitting: Internal Medicine

## 2013-03-29 ENCOUNTER — Inpatient Hospital Stay (HOSPITAL_COMMUNITY): Payer: Medicare Other

## 2013-03-29 DIAGNOSIS — R7989 Other specified abnormal findings of blood chemistry: Secondary | ICD-10-CM

## 2013-03-29 DIAGNOSIS — G9340 Encephalopathy, unspecified: Secondary | ICD-10-CM

## 2013-03-29 DIAGNOSIS — I214 Non-ST elevation (NSTEMI) myocardial infarction: Secondary | ICD-10-CM | POA: Diagnosis present

## 2013-03-29 DIAGNOSIS — I2510 Atherosclerotic heart disease of native coronary artery without angina pectoris: Secondary | ICD-10-CM | POA: Diagnosis present

## 2013-03-29 DIAGNOSIS — E785 Hyperlipidemia, unspecified: Secondary | ICD-10-CM | POA: Diagnosis present

## 2013-03-29 DIAGNOSIS — E119 Type 2 diabetes mellitus without complications: Secondary | ICD-10-CM | POA: Diagnosis present

## 2013-03-29 DIAGNOSIS — R569 Unspecified convulsions: Secondary | ICD-10-CM

## 2013-03-29 LAB — BASIC METABOLIC PANEL
GFR calc Af Amer: 81 mL/min — ABNORMAL LOW (ref >90)
GFR calc non Af Amer: 70 mL/min — ABNORMAL LOW (ref >90)
Glucose, Bld: 471 mg/dL — ABNORMAL HIGH (ref 70–99)
Potassium: 4.6 mEq/L (ref 3.5–5.1)
Sodium: 134 mEq/L — ABNORMAL LOW (ref 135–145)

## 2013-03-29 LAB — TROPONIN I
Troponin I: 1.32 ng/mL (ref <0.30)
Troponin I: 0.95 ng/mL (ref <0.30)

## 2013-03-29 LAB — CBC WITH DIFFERENTIAL/PLATELET
Eosinophils Relative: 0 % (ref 0–5)
HCT: 35.7 % — ABNORMAL LOW (ref 39.0–52.0)
Lymphocytes Relative: 10 % — ABNORMAL LOW (ref 12–46)
Lymphs Abs: 0.7 10*3/uL (ref 0.7–4.0)
MCV: 84.4 fL (ref 78.0–100.0)
Monocytes Absolute: 0.1 10*3/uL (ref 0.1–1.0)
Monocytes Relative: 1 % — ABNORMAL LOW (ref 3–12)
RBC: 4.23 MIL/uL (ref 4.22–5.81)
RDW: 13.8 % (ref 11.5–15.5)
WBC: 6.9 10*3/uL (ref 4.0–10.5)

## 2013-03-29 LAB — COMPREHENSIVE METABOLIC PANEL
Albumin: 3.4 g/dL — ABNORMAL LOW (ref 3.5–5.2)
Alkaline Phosphatase: 95 U/L (ref 39–117)
BUN: 23 mg/dL (ref 6–23)
Potassium: 4.1 mEq/L (ref 3.5–5.1)
Total Protein: 7.2 g/dL (ref 6.0–8.3)

## 2013-03-29 LAB — MRSA PCR SCREENING: MRSA by PCR: NEGATIVE

## 2013-03-29 LAB — GLUCOSE, CAPILLARY

## 2013-03-29 LAB — LIPID PANEL
Cholesterol: 129 mg/dL (ref 0–200)
Triglycerides: 46 mg/dL (ref <150)

## 2013-03-29 SURGERY — JV LEFT HEART CATHETERIZATION WITH CORONARY ANGIOGRAM
Anesthesia: Moderate Sedation

## 2013-03-29 SURGERY — LEFT HEART CATHETERIZATION WITH CORONARY ANGIOGRAM
Anesthesia: LOCAL

## 2013-03-29 MED ORDER — INSULIN ASPART 100 UNIT/ML ~~LOC~~ SOLN: 0.0000 [IU] | Freq: Every day | SUBCUTANEOUS | Status: DC

## 2013-03-29 MED ORDER — INSULIN GLARGINE 100 UNIT/ML ~~LOC~~ SOLN
70.0000 [IU] | Freq: Every day | SUBCUTANEOUS | Status: DC
  Administered 2013-03-29: 70 [IU] via SUBCUTANEOUS
  Filled 2013-03-29 (×2): qty 0.7

## 2013-03-29 MED ORDER — STROKE: EARLY STAGES OF RECOVERY BOOK
Freq: Once | Status: AC
  Administered 2013-03-29: 01:00:00
  Filled 2013-03-29: qty 1

## 2013-03-29 MED ORDER — INSULIN ASPART 100 UNIT/ML ~~LOC~~ SOLN
7.0000 [IU] | Freq: Once | SUBCUTANEOUS | Status: AC
  Administered 2013-03-29: 7 [IU] via SUBCUTANEOUS

## 2013-03-29 MED ORDER — INSULIN ASPART 100 UNIT/ML ~~LOC~~ SOLN
15.0000 [IU] | Freq: Once | SUBCUTANEOUS | Status: AC
  Administered 2013-03-29: 15 [IU] via SUBCUTANEOUS

## 2013-03-29 MED ORDER — INSULIN GLARGINE 100 UNIT/ML ~~LOC~~ SOLN
60.0000 [IU] | Freq: Every day | SUBCUTANEOUS | Status: DC
  Filled 2013-03-29: qty 0.6

## 2013-03-29 MED ORDER — INSULIN ASPART 100 UNIT/ML ~~LOC~~ SOLN
0.0000 [IU] | Freq: Three times a day (TID) | SUBCUTANEOUS | Status: DC
  Administered 2013-03-29: 5 [IU] via SUBCUTANEOUS

## 2013-03-29 MED ORDER — LORAZEPAM 2 MG/ML IJ SOLN: 1.0000 mg | INTRAMUSCULAR | Status: DC | PRN

## 2013-03-29 MED ORDER — SENNOSIDES-DOCUSATE SODIUM 8.6-50 MG PO TABS
1.0000 | ORAL_TABLET | Freq: Every evening | ORAL | Status: DC | PRN
  Administered 2013-03-30: 1 via ORAL
  Filled 2013-03-29 (×2): qty 1

## 2013-03-29 MED ORDER — INSULIN ASPART 100 UNIT/ML ~~LOC~~ SOLN
0.0000 [IU] | Freq: Three times a day (TID) | SUBCUTANEOUS | Status: DC
  Administered 2013-03-30: 11 [IU] via SUBCUTANEOUS
  Administered 2013-03-30: 15 [IU] via SUBCUTANEOUS

## 2013-03-29 MED ORDER — SODIUM CHLORIDE 0.9 % IV SOLN
INTRAVENOUS | Status: AC
  Administered 2013-03-29: 50 mL via INTRAVENOUS

## 2013-03-29 NOTE — Progress Notes (Signed)
Portable EEG completed

## 2013-03-29 NOTE — Evaluation (Signed)
Clinical/Bedside Swallow Evaluation Patient Details  Name: George [name] MRN: [id number] Date of Birth: [birth year]  Today's Date: 03/29/2013 Time: 0814-0826 SLP Time Calculation (min): 12 min  Past Medical History:  Past Medical History  Diagnosis Date  . Coronary artery disease   . Diabetes mellitus without complication   . Hyperlipidemia    Past Surgical History:  Past Surgical History  Procedure Laterality Date  . Cardiac catheterization     HPI:  67 yr old admitted after being found down by his wife.  CT concerning for mass versus stroke.  MRI pending.  PMH:  stent, CAD, DM.   Assessment / Plan / Recommendation Clinical Impression  Overall, pt. exhibited a functional oropharyngeal swallow function.  Immediate cough following cracker likely due to premature spill of crumb.  Aspiration risk appears mild from this assessment.  Recommend regular diet texture and thin liquids.  Will follow once more for safety.    Aspiration Risk  Mild    Diet Recommendation Regular;Thin liquid   Liquid Administration via: Straw Medication Administration: Whole meds with liquid Supervision: Patient able to self feed;Intermittent supervision to cue for compensatory strategies Compensations: Slow rate;Small sips/bites Postural Changes and/or Swallow Maneuvers: Seated upright 90 degrees    Other  Recommendations Oral Care Recommendations: Oral care BID   Follow Up Recommendations  None    Frequency and Duration min 1 x/week  2 weeks   Pertinent Vitals/Pain none    SLP Swallow Goals Patient will utilize recommended strategies during swallow to increase swallowing safety with: Modified independent assistance   Swallow Study Prior Functional Status       General HPI: 67 yr old admitted after being found down by his wife.  CT concerning for mass versus stroke.  MRI pending.  PMH:  stent, CAD, DM. Type of Study: Bedside swallow evaluation Previous Swallow Assessment:  (none) Diet  Prior to this Study: NPO Temperature Spikes Noted: No Respiratory Status: Room air History of Recent Intubation: No Behavior/Cognition: Alert;Cooperative;Pleasant mood;Requires cueing Oral Cavity - Dentition: Adequate natural dentition Self-Feeding Abilities: Able to feed self Patient Positioning: Upright in bed Baseline Vocal Quality: Clear Volitional Cough: Weak Volitional Swallow: Able to elicit    Oral/Motor/Sensory Function Overall Oral Motor/Sensory Function: Appears within functional limits for tasks assessed   Ice Chips Ice chips: Not tested   Thin Liquid Thin Liquid: Within functional limits Presentation: Straw    Nectar Thick Nectar Thick Liquid: Not tested   Honey Thick Honey Thick Liquid: Not tested   Puree Puree: Within functional limits   Solid   GO    Solid: Impaired Pharyngeal Phase Impairments: Cough - Immediate       Royce Macadamia M.Ed [employer] [phone]  03/29/2013

## 2013-03-29 NOTE — H&P (Signed)
[facility] History and Physical  George [name] YNW:[id number] DOB: 24-Jan-1946 DOA: 03/28/2013  Referring physician: ER physician. PCP: No primary provider on file. Dr. Harrington Challenger. [facility]. Specialists: Dr. Radford Pax. Cardiologist.  Chief Complaint: Altered mental status.  HPI: George [name] is a 66 y.o. male history of CAD status post stent, diabetes mellitus type 2, hyperlipidemia was found on the floor in his house by his wife that around 6 PM. Patient was found to be initially confused but was arousable. Patient had incontinence of his urine. Later patient was able to stand up. Did not have any focal deficits. The last seen normal was on 2 PM. Patient was brought to the ER. CT head showed features concerning for mass versus stroke. Neurologist on-call Dr. Leonel Ramsay has already evaluated the patient and patient has been started on Keppra and Decadron for possible seizures and brain mass. Patient's troponin is also was found to be elevated with EKG showing no ST elevations. On-call cardiologist Dr. Colon Flattery was consulted by ER physician. Patient did not have any chest pain today but did have some chest discomfort last week and was originally scheduled for cardiac catheter today. Patient presently is sleeping but easily arousable and follows commands. Denies any chest pain shortness of breath headache. Is able to move all extremities. Is oriented to time place and person.  Review of Systems: As presented in the history of presenting illness, rest negative.  Past Medical History  Diagnosis Date  . Coronary artery disease   . Diabetes mellitus without complication   . Hyperlipidemia    Past Surgical History  Procedure Laterality Date  . Cardiac catheterization     Social History:  reports that he does not use illicit drugs. His tobacco and alcohol histories are not on file. Home. where does patient live-- Can do ADLs. Can patient participate in ADLs?  No Known Allergies  No  family history on file.    Prior to Admission medications   Not on File   Physical Exam: Filed Vitals:   03/28/13 2200 03/28/13 2215 03/28/13 2253 03/29/13 0000  BP: 120/67 105/74 132/64 124/55  Pulse: 95 93 89 89  Temp:   98.5 F (36.9 C)   TempSrc:   Oral   Resp: 19 20 21 20   SpO2: 100% 100% 98% 96%     General:  Well developed and nourished.  Eyes: Anicteric no pallor.  ENT: No discharge from ears eyes nose mouth.  Neck: No mass felt.  Cardiovascular: S1-S2 heard.  Respiratory: No rhonchi or crepitations.  Abdomen: Soft nontender bowel sounds present.  Skin: No rash.  Musculoskeletal: No edema.  Psychiatric: Appears normal.  Neurologic: Alert awake oriented to time place and person. Moves all extremities.  Labs on Admission:  Basic Metabolic Panel:  Recent Labs Lab 03/28/13 1904  NA 137  K 4.4  CL 102  CO2 25  GLUCOSE 241*  BUN 27*  CREATININE 0.87  CALCIUM 10.7*   Liver Function Tests:  Recent Labs Lab 03/28/13 1904  AST 17  ALT 19  ALKPHOS 96  BILITOT 0.3  PROT 7.0  ALBUMIN 3.4*   No results found for this basename: LIPASE, AMYLASE,  in the last 168 hours No results found for this basename: AMMONIA,  in the last 168 hours CBC:  Recent Labs Lab 03/28/13 1904  WBC 10.7*  NEUTROABS 8.8*  HGB 12.0*  HCT 34.2*  MCV 84.4  PLT 210   Cardiac Enzymes:  Recent Labs Lab 03/28/13 1904  TROPONINI 1.23*    BNP (last 3 results) No results found for this basename: PROBNP,  in the last 8760 hours CBG:  Recent Labs Lab 03/28/13 2253  GLUCAP 173*    Radiological Exams on Admission: Dg Chest 2 View  03/28/2013   *RADIOLOGY REPORT*  Clinical Data: Altered mental status, fall  CHEST - 2 VIEW  Comparison: None.  Findings: Cardiac leads overlie the chest.  Heart size is normal. Lung volumes are low but clear.  No pleural effusion.  No acute osseous finding. Mild central bronchial wall thickening noted.  IMPRESSION: No focal pulmonary  opacity.  Mild central bronchial wall thickening which may indicate reactive airways disease or bronchitis.   Original Report Authenticated By: Conchita Paris, M.D.   Ct Head Wo Contrast  03/28/2013   *RADIOLOGY REPORT*  Clinical Data:  Altered mental status and fall  CT HEAD WITHOUT CONTRAST CT CERVICAL SPINE WITHOUT CONTRAST  Technique:  Multidetector CT imaging of the head and cervical spine was performed following the standard protocol without intravenous contrast.  Multiplanar CT image reconstructions of the cervical spine were also generated.  Comparison:  CT 03/26/1999 is not digitized.  Report is reviewed.  CT HEAD  Findings: There is a large area of abnormal hypo attenuation within the gray matter and extending to the cortical surface in the left frontoparietal lobe region.  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  No acute hemorrhage is seen.  No sulcal effacement.  No midline shift.  No skull fracture. Orbits and paranasal sinuses are intact.  IMPRESSION: Abnormal area of predominately white matter left frontoparietal lobe hypodensity, which may indicate acute versus subacute infarct. Infiltrative neoplasm such as glioblastoma multiforme is possible but considered less likely given absence of focal effacement.  No acute hemorrhage.  CT CERVICAL SPINE  Findings: C1 through the cervical thoracic junction is visualized in its entirety. No precervical soft tissue widening is present. No fracture or dislocation.  Alignment is normal.  IMPRESSION: No acute cervical spine abnormality.  Critical Value/emergent results were called by telephone at the time of interpretation on 03/28/2013 at 8:25 p.m. to Dr. Regenia Skeeter, who verbally acknowledged these results.   Original Report Authenticated By: Conchita Paris, M.D.   Ct Cervical Spine Wo Contrast  03/28/2013   *RADIOLOGY REPORT*  Clinical Data:  Altered mental status and fall  CT HEAD WITHOUT CONTRAST CT CERVICAL SPINE  WITHOUT CONTRAST  Technique:  Multidetector CT imaging of the head and cervical spine was performed following the standard protocol without intravenous contrast.  Multiplanar CT image reconstructions of the cervical spine were also generated.  Comparison:  CT 03/26/1999 is not digitized.  Report is reviewed.  CT HEAD  Findings: There is a large area of abnormal hypo attenuation within the gray matter and extending to the cortical surface in the left frontoparietal lobe region.  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  No acute hemorrhage is seen.  No sulcal effacement.  No midline shift.  No skull fracture. Orbits and paranasal sinuses are intact.  IMPRESSION: Abnormal area of predominately white matter left frontoparietal lobe hypodensity, which may indicate acute versus subacute infarct. Infiltrative neoplasm such as glioblastoma multiforme is possible but considered less likely given absence of focal effacement.  No acute hemorrhage.  CT CERVICAL SPINE  Findings: C1 through the cervical thoracic junction is visualized in its entirety. No precervical soft tissue widening is present. No fracture or dislocation.  Alignment is normal.  IMPRESSION: No acute cervical  spine abnormality.  Critical Value/emergent results were called by telephone at the time of interpretation on 03/28/2013 at 8:25 p.m. to Dr. Regenia Skeeter, who verbally acknowledged these results.   Original Report Authenticated By: Conchita Paris, M.D.    EKG: Independently reviewed. Sinus tachycardia.  Assessment/Plan Principal Problem:   Acute encephalopathy Active Problems:   Non-ST elevation MI (NSTEMI)   Diabetes mellitus   Hyperlipidemia   Seizure   1. Acute encephalopathy possibly secondary to seizures with brain mass - neurologist has already seen the patient. At this time they have ordered MRI brain with and without contrast. Patient has been placed on Keppra IV along with Decadron. Seizure  precautions. EEG. Neuro checks. Swallow evaluation. Further recommendations per neurologist. 2. Non-ST elevation MI - with history of CAD status post stenting - I have discussed with Dr. Colon Flattery, on-call cardiologist. At this time they have advised no heparin as patient is being worked up for brain mass. We will cycle cardiac markers and they will be following as consult. 3. Diabetes mellitus type 2 - closely follow CBGs with sliding-scale coverage. Patient's blood sugar will go high as patient is also on Decadron. Home medication has to be verified. Check hemoglobin A1c. 4. Hyperlipidemia - continue statins when patient can take orally. 5. Mild hypercalcemia - closely follow metabolic panel. Check vitamin D and parathormone levels.    Code Status: Full code.  Family Communication: Patient's wife at the bedside.  Disposition Plan: Admit to inpatient.    KAKRAKANDY,ARSHAD N. [facility] Pager [phone].  If 7PM-7AM, please contact night-coverage [email or web address] Password [facility] 03/29/2013, 12:39 AM

## 2013-03-29 NOTE — Progress Notes (Signed)
Utilization review completed.  

## 2013-03-29 NOTE — Progress Notes (Signed)
SUBJECTIVE:  No complaints of chest pain  OBJECTIVE:   Vitals:   Filed Vitals:   03/29/13 0600 03/29/13 0800 03/29/13 0900 03/29/13 1000  BP: 126/61 117/58  121/65  Pulse: 80 83 85 87  Temp:  98 F (36.7 C)    TempSrc:  Oral    Resp: 18 21  23   Weight:      SpO2: 95% 96% 96% 97%   I&O's:   Intake/Output Summary (Last 24 hours) at 03/29/13 1046 Last data filed at 03/29/13 1000  Gross per 24 hour  Intake    555 ml  Output   1250 ml  Net   -695 ml   TELEMETRY: Reviewed telemetry pt in NSR:     PHYSICAL EXAM General: Well developed, well nourished, in no acute distress Head: Eyes PERRLA, No xanthomas.   Normal cephalic and atramatic  Lungs:   Clear bilaterally to auscultation and percussion. Heart:   HRRR S1 S2 Pulses are 2+ & equal. Abdomen: Bowel sounds are positive, abdomen soft and non-tender without masses Extremities:   No clubbing, cyanosis or edema.  DP +1 Neuro: Alert and oriented X 3. Psych:  Good affect, responds appropriately   LABS: Basic Metabolic Panel:  Recent Labs  03/28/13 1904 03/29/13 0700  NA 137 138  K 4.4 4.1  CL 102 104  CO2 25 22  GLUCOSE 241* 268*  BUN 27* 23  CREATININE 0.87 0.75  CALCIUM 10.7* 10.3   Liver Function Tests:  Recent Labs  03/28/13 1904 03/29/13 0700  AST 17 23  ALT 19 17  ALKPHOS 96 95  BILITOT 0.3 0.4  PROT 7.0 7.2  ALBUMIN 3.4* 3.4*   No results found for this basename: LIPASE, AMYLASE,  in the last 72 hours CBC:  Recent Labs  03/28/13 1904 03/29/13 0700  WBC 10.7* 6.9  NEUTROABS 8.8* 6.1  HGB 12.0* 12.0*  HCT 34.2* 35.7*  MCV 84.4 84.4  PLT 210 181   Cardiac Enzymes:  Recent Labs  03/28/13 1904 03/29/13 0130 03/29/13 0700  TROPONINI 1.23* 1.68* 1.32*   BNP: No components found with this basename: POCBNP,  D-Dimer: No results found for this basename: DDIMER,  in the last 72 hours Hemoglobin A1C: No results found for this basename: HGBA1C,  in the last 72 hours Fasting Lipid  Panel:  Recent Labs  03/29/13 0700  CHOL 129  HDL 35*  LDLCALC 85  TRIG 46  CHOLHDL 3.7   Thyroid Function Tests: No results found for this basename: TSH, T4TOTAL, FREET3, T3FREE, THYROIDAB,  in the last 72 hours Anemia Panel: No results found for this basename: VITAMINB12, FOLATE, FERRITIN, TIBC, IRON, RETICCTPCT,  in the last 72 hours Coag Panel:   No results found for this basename: INR, PROTIME    RADIOLOGY: Dg Chest 2 View  03/28/2013   *RADIOLOGY REPORT*  Clinical Data: Altered mental status, fall  CHEST - 2 VIEW  Comparison: None.  Findings: Cardiac leads overlie the chest.  Heart size is normal. Lung volumes are low but clear.  No pleural effusion.  No acute osseous finding. Mild central bronchial wall thickening noted.  IMPRESSION: No focal pulmonary opacity.  Mild central bronchial wall thickening which may indicate reactive airways disease or bronchitis.   Original Report Authenticated By: Conchita Paris, M.D.   Ct Head Wo Contrast  03/28/2013   *RADIOLOGY REPORT*  Clinical Data:  Altered mental status and fall  CT HEAD WITHOUT CONTRAST CT CERVICAL SPINE WITHOUT CONTRAST  Technique:  Multidetector CT  imaging of the head and cervical spine was performed following the standard protocol without intravenous contrast.  Multiplanar CT image reconstructions of the cervical spine were also generated.  Comparison:  CT 03/26/1999 is not digitized.  Report is reviewed.  CT HEAD  Findings: There is a large area of abnormal hypo attenuation within the gray matter and extending to the cortical surface in the left frontoparietal lobe region.  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  No acute hemorrhage is seen.  No sulcal effacement.  No midline shift.  No skull fracture. Orbits and paranasal sinuses are intact.  IMPRESSION: Abnormal area of predominately white matter left frontoparietal lobe hypodensity, which may indicate acute versus subacute infarct.  Infiltrative neoplasm such as glioblastoma multiforme is possible but considered less likely given absence of focal effacement.  No acute hemorrhage.  CT CERVICAL SPINE  Findings: C1 through the cervical thoracic junction is visualized in its entirety. No precervical soft tissue widening is present. No fracture or dislocation.  Alignment is normal.  IMPRESSION: No acute cervical spine abnormality.  Critical Value/emergent results were called by telephone at the time of interpretation on 03/28/2013 at 8:25 p.m. to Dr. Regenia Skeeter, who verbally acknowledged these results.   Original Report Authenticated By: Conchita Paris, M.D.   Ct Cervical Spine Wo Contrast  03/28/2013   *RADIOLOGY REPORT*  Clinical Data:  Altered mental status and fall  CT HEAD WITHOUT CONTRAST CT CERVICAL SPINE WITHOUT CONTRAST  Technique:  Multidetector CT imaging of the head and cervical spine was performed following the standard protocol without intravenous contrast.  Multiplanar CT image reconstructions of the cervical spine were also generated.  Comparison:  CT 03/26/1999 is not digitized.  Report is reviewed.  CT HEAD  Findings: There is a large area of abnormal hypo attenuation within the gray matter and extending to the cortical surface in the left frontoparietal lobe region.  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  No acute hemorrhage is seen.  No sulcal effacement.  No midline shift.  No skull fracture. Orbits and paranasal sinuses are intact.  IMPRESSION: Abnormal area of predominately white matter left frontoparietal lobe hypodensity, which may indicate acute versus subacute infarct. Infiltrative neoplasm such as glioblastoma multiforme is possible but considered less likely given absence of focal effacement.  No acute hemorrhage.  CT CERVICAL SPINE  Findings: C1 through the cervical thoracic junction is visualized in its entirety. No precervical soft tissue widening is present. No fracture or  dislocation.  Alignment is normal.  IMPRESSION: No acute cervical spine abnormality.  Critical Value/emergent results were called by telephone at the time of interpretation on 03/28/2013 at 8:25 p.m. to Dr. Regenia Skeeter, who verbally acknowledged these results.   Original Report Authenticated By: Conchita Paris, M.D.   Impression:  Nonspecific troponin elevation  Seizure  Brain mass of unclear etiology  No clear signs or reported symptoms to suggestive acute coronary syndrome.  Recommendations:  -Agree with neurology recommendations to further evaluated mass and etiology of encephalopathy, seizure activity  - Monitor on telemetry, ecg prn for rhythm change or symptoms  - Trend troponins  - Given unclear brain mass with possible edema, acute infarct, would hold on anticoagulation at this time.  - Will defer on cardiac catheterization for now     Sueanne Margarita, MD  03/29/2013  10:46 AM

## 2013-03-29 NOTE — ED Provider Notes (Signed)
I saw and evaluated the patient, reviewed the resident's note and I agree with the findings and plan.   Neuro concerned for mass, given decadron to help reduce edema. Airway stable after seizure and improved in ED. Will admit to stepdown. I talked to cards about his elevated troponin, they will trend but he is not an anticoagulation candidate at this point with a possible brain mass.  Audree Camel, MD 03/29/13 1041

## 2013-03-29 NOTE — Progress Notes (Signed)
[facility] Progress Note [place] TEAM 1 - Stepdown/ICU TEAM   George [name] ZOX:[id number] DOB: 1946-01-27 DOA: 03/28/2013 PCP: No primary provider on file.  Brief narrative: George [name] is a 67 y.o. male history of CAD status post stent, diabetes mellitus type 2, hyperlipidemia was found on the floor in his house by his wife that around 6 PM. Patient was found to be initially confused but was arousable. Patient had incontinence of his urine. Later patient was able to stand up. Did not have any focal deficits. The last seen normal was on 2 PM. Patient was brought to the ER. CT head showed features concerning for mass versus stroke. Neurologist on-call Dr. Amada Jupiter has already evaluated the patient and patient has been started on Keppra and Decadron for possible seizures and brain mass. Patient's troponin is also was found to be elevated with EKG showing no ST elevations. On-call cardiologist Dr. Terressa Koyanagi was consulted by ER physician. Patient did not have any chest pain today but did have some chest discomfort last week and was originally scheduled for cardiac catheter today. Patient presently is sleeping but easily arousable and follows commands. Denies any chest pain shortness of breath headache. Is able to move all extremities. Is oriented to time place and person.   Assessment/Plan: Principal Problem: Syncope/ Seizure? 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."    Non-ST elevation MI (NSTEMI) - was following with [facility] as outpt - was due for a cath but due to above finding, cath  is being postponed (cannot giver blood thinners)  - was taking ASA- will not resume this for now    Diabetes mellitus - states he takes lantus 60 U at bedtime- med rec states that he takes no meds at home - will start Lantus- will increase dose to 70 U as he is on steroids- - Increase sliding scale from sensitive to moderate as he has passed a swallow eval    Hyperlipidemia - takes fish oil at home    Code Status: full code Family Communication: with wife Disposition Plan: follow in sDu  Consultants: Neuro Cardio  Procedures: non  Antibiotics: none  DVT prophylaxis: SCDs  HPI/Subjective: Pt has not complaints today- wife and pt admit to his difficulty with word finding. He has been explained why we need an MRI, why he is receiving steroids and why his sugars are elevated.    Objective: Blood pressure 121/65, pulse 87, temperature 98 F (36.7 C), temperature source Oral, resp. rate 23, weight 96.3 kg (212 lb 4.9 oz), SpO2 97.00%.  Intake/Output Summary (Last 24 hours) at 03/29/13 1757 Last data filed at 03/29/13 1100  Gross per 24 hour  Intake    605 ml  Output   1250 ml  Net   -645 ml     Exam: General: No acute respiratory distress Lungs: Clear to auscultation bilaterally without wheezes or crackles Cardiovascular: Regular rate and rhythm without murmur gallop or rub normal S1 and S2 Abdomen: Nontender, nondistended, soft, bowel sounds positive, no rebound, no ascites, no appreciable mass Extremities: No significant cyanosis, clubbing, or edema bilateral lower extremities  Data Reviewed: Basic Metabolic Panel:  Recent Labs Lab 03/28/13 1904 03/29/13 0700  NA 137 138  K 4.4 4.1  CL 102 104  CO2 25 22  GLUCOSE 241* 268*  BUN 27* 23  CREATININE 0.87 0.75  CALCIUM 10.7* 10.3   Liver Function Tests:  Recent Labs Lab 03/28/13 1904 03/29/13 0700  AST 17 23  ALT 19 17  ALKPHOS 96 95  BILITOT 0.3 0.4  PROT 7.0 7.2  ALBUMIN 3.4* 3.4*   No  results found for this basename: LIPASE, AMYLASE,  in the last 168 hours No results found for this basename: AMMONIA,  in the last 168 hours CBC:  Recent Labs Lab 03/28/13 1904 03/29/13 0700  WBC 10.7* 6.9  NEUTROABS 8.8* 6.1  HGB 12.0* 12.0*  HCT 34.2* 35.7*  MCV 84.4 84.4  PLT 210 181   Cardiac Enzymes:  Recent Labs Lab 03/28/13 1904 03/29/13 0130 03/29/13 0700 03/29/13 1429  TROPONINI 1.23* 1.68* 1.32* 0.95*   BNP (last 3 results) No results found for this basename: PROBNP,  in the last 8760 hours CBG:  Recent Labs Lab 03/28/13 2253 03/29/13 0959 03/29/13 1025 03/29/13 1740  GLUCAP 173* 297* 298* 467*    Recent Results (from the past 240 hour(s))  MRSA PCR SCREENING     Status: None   Collection Time    03/29/13  1:10 AM      Result Value Range Status   MRSA by PCR NEGATIVE  NEGATIVE Final   Comment:            The GeneXpert MRSA Assay (FDA     approved for NASAL specimens     only), is one component of a     comprehensive MRSA colonization     surveillance program. It is not     intended to diagnose MRSA     infection nor to guide or     monitor treatment for     MRSA infections.     Studies:  Recent x-ray studies have been reviewed in detail by the Attending Physician  Scheduled Meds:  Scheduled Meds: . dexamethasone  6 mg Intravenous Q6H  . [START ON 03/30/2013] insulin aspart  0-15 Units Subcutaneous TID WC  . insulin aspart  0-5 Units Subcutaneous QHS  . insulin glargine  60 Units Subcutaneous QHS  . levETIRAcetam  500 mg Intravenous Q12H   Continuous Infusions: . sodium chloride 50 mL (03/29/13 0030)    Time spent on care of this patient: 35 min   George Luka, MD  [facility] Office  [phone] Pager - Text Page per Loretha Stapler as per below:  On-Call/Text Page:      [email or web address]      password [email or web address]  If 7PM-7AM, please contact night-coverage [email or web address] Password [email or web address] 03/29/2013, 5:57 PM   LOS: 1 day

## 2013-03-29 NOTE — Progress Notes (Signed)
Inpatient Diabetes Program Recommendations  AACE/ADA: New Consensus Statement on Inpatient Glycemic Control (2013)  Target Ranges:  Prepandial:   less than 140 mg/dL      Peak postprandial:   less than 180 mg/dL (1-2 hours)      Critically ill patients:  140 - 180 mg/dL   Hyperglycemia on steroid therapy  Inpatient Diabetes Program Recommendations Insulin - Basal: Please add some basal lantus to regimen, esp while on steroid therapy Correction (SSI): Please increase correction to moderate tidwc  Thank you, Lenor Coffin, RN, CNS, Diabetes Coordinator [phone])

## 2013-03-29 NOTE — Progress Notes (Addendum)
NEURO HOSPITALIST PROGRESS NOTE   SUBJECTIVE:                                                                                                                         No further seizure activity noted. Patient is alert and oriented.  He prefers to have the C-collar removed. EEG currently being obtained.  OBJECTIVE:                                                                                                                           Vital signs in last 24 hours: Temp:  [98 F (36.7 C)-98.7 F (37.1 C)] 98 F (36.7 C) (08/26 0800) Pulse Rate:  [80-106] 83 (08/26 0800) Resp:  [18-33] 21 (08/26 0800) BP: (105-147)/(55-79) 117/58 mmHg (08/26 0800) SpO2:  [89 %-100 %] 96 % (08/26 0800) Weight:  [96.3 kg (212 lb 4.9 oz)] 96.3 kg (212 lb 4.9 oz) (08/26 0000)  Intake/Output from previous day: 08/25 0701 - 08/26 0700 In: 405 [I.V.:300; IV Piggyback:105] Out: 1000 [Urine:1000] Intake/Output this shift: Total I/O In: 50 [I.V.:50] Out: -  Nutritional status: General  Past Medical History  Diagnosis Date  . Coronary artery disease   . Diabetes mellitus without complication   . Hyperlipidemia     Neurologic Exam:   Mental Status: Alert, oriented to place, month, but thought it was 2015. thought processing slow.  Speech fluent without evidence of aphasia.  Able to follow 3 step commands without difficulty. Cranial Nerves: II:  Visual fields grossly normal, pupils equal, round, reactive to light and accommodation III,IV, VI: ptosis not present, extra-ocular motions intact with exceptio of the right eye not fully Adducting when looking to the left.  V,VII: smile symmetric, facial light touch sensation normal bilaterally VIII: hearing normal bilaterally IX,X: gag reflex present XI: bilateral shoulder shrug XII: midline tongue extension Motor: Right : Upper extremity   5/5    Left:     Upper extremity   5/5  Lower extremity   5/5     Lower  extremity   5/5 Tone and bulk:normal tone throughout; no atrophy noted Sensory: Pinprick and light touch intact throughout, bilaterally Deep Tendon Reflexes:  Right: Upper Extremity   Left: Upper extremity  biceps (C-5 to C-6) 2/4   biceps (C-5 to C-6) 2/4 tricep (C7) 2/4    triceps (C7) 2/4 Brachioradialis (C6) 2/4  Brachioradialis (C6) 2/4  Lower Extremity Lower Extremity  quadriceps (L-2 to L-4) 2/4   quadriceps (L-2 to L-4) 2/4 Achilles (S1) 2/4   Achilles (S1) 2/4  Plantars: Right: downgoing   Left: downgoing Cerebellar: normal finger-to-nose,  normal heel-to-shin test CV: pulses palpable throughout   Lab Results: Lab Results  Component Value Date/Time   CHOL 129 03/29/2013  7:00 AM   Lipid Panel  Recent Labs  03/29/13 0700  CHOL 129  TRIG 46  HDL 35*  CHOLHDL 3.7  VLDL 9  LDLCALC 85    Studies/Results: Dg Chest 2 View  03/28/2013   *RADIOLOGY REPORT*  Clinical Data: Altered mental status, fall  CHEST - 2 VIEW  Comparison: None.  Findings: Cardiac leads overlie the chest.  Heart size is normal. Lung volumes are low but clear.  No pleural effusion.  No acute osseous finding. Mild central bronchial wall thickening noted.  IMPRESSION: No focal pulmonary opacity.  Mild central bronchial wall thickening which may indicate reactive airways disease or bronchitis.   Original Report Authenticated By: Conchita Paris, M.D.   Ct Head Wo Contrast  03/28/2013   *RADIOLOGY REPORT*  Clinical Data:  Altered mental status and fall  CT HEAD WITHOUT CONTRAST CT CERVICAL SPINE WITHOUT CONTRAST  Technique:  Multidetector CT imaging of the head and cervical spine was performed following the standard protocol without intravenous contrast.  Multiplanar CT image reconstructions of the cervical spine were also generated.  Comparison:  CT 03/26/1999 is not digitized.  Report is reviewed.  CT HEAD  Findings: There is a large area of abnormal hypo attenuation within the gray matter and extending to  the cortical surface in the left frontoparietal lobe region.  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  No acute hemorrhage is seen.  No sulcal effacement.  No midline shift.  No skull fracture. Orbits and paranasal sinuses are intact.  IMPRESSION: Abnormal area of predominately white matter left frontoparietal lobe hypodensity, which may indicate acute versus subacute infarct. Infiltrative neoplasm such as glioblastoma multiforme is possible but considered less likely given absence of focal effacement.  No acute hemorrhage.  CT CERVICAL SPINE  Findings: C1 through the cervical thoracic junction is visualized in its entirety. No precervical soft tissue widening is present. No fracture or dislocation.  Alignment is normal.  IMPRESSION: No acute cervical spine abnormality.  Critical Value/emergent results were called by telephone at the time of interpretation on 03/28/2013 at 8:25 p.m. to Dr. Regenia Skeeter, who verbally acknowledged these results.   Original Report Authenticated By: Conchita Paris, M.D.   Ct Cervical Spine Wo Contrast  03/28/2013   *RADIOLOGY REPORT*  Clinical Data:  Altered mental status and fall  CT HEAD WITHOUT CONTRAST CT CERVICAL SPINE WITHOUT CONTRAST  Technique:  Multidetector CT imaging of the head and cervical spine was performed following the standard protocol without intravenous contrast.  Multiplanar CT image reconstructions of the cervical spine were also generated.  Comparison:  CT 03/26/1999 is not digitized.  Report is reviewed.  CT HEAD  Findings: There is a large area of abnormal hypo attenuation within the gray matter and extending to the cortical surface in the left frontoparietal lobe region.  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  No acute hemorrhage is seen.  No sulcal effacement.  No midline shift.  No skull fracture.  Orbits and paranasal sinuses are intact.  IMPRESSION: Abnormal area of predominately  white matter left frontoparietal lobe hypodensity, which may indicate acute versus subacute infarct. Infiltrative neoplasm such as glioblastoma multiforme is possible but considered less likely given absence of focal effacement.  No acute hemorrhage.  CT CERVICAL SPINE  Findings: C1 through the cervical thoracic junction is visualized in its entirety. No precervical soft tissue widening is present. No fracture or dislocation.  Alignment is normal.  IMPRESSION: No acute cervical spine abnormality.  Critical Value/emergent results were called by telephone at the time of interpretation on 03/28/2013 at 8:25 p.m. to Dr. Regenia Skeeter, who verbally acknowledged these results.   Original Report Authenticated By: Conchita Paris, M.D.    MEDICATIONS                                                                                                                        Scheduled: . dexamethasone  6 mg Intravenous Q6H  . insulin aspart  0-9 Units Subcutaneous TID WC  . levETIRAcetam  500 mg Intravenous Q12H    ASSESSMENT/PLAN:                                                                                                             67 YO male with seizure and left frontal lobe lesion.  MRI pending. No further seizures noted while on Keppra 500 mg BID.  EEG in progress.    Recommend: 1) MRI brain with and without contrast  2) Decadron 10 mg x1 then 6 mg every 6 hours  3) Keppra  500 mg twice a day  4) for recurrent seizures, which is lorazepam 1-2 mg IV  5) EEG     Assessment and plan discussed with with attending physician and they are in agreement.    Etta Quill PA-C [facility] [phone]  03/29/2013, 9:38 AM

## 2013-03-29 NOTE — Evaluation (Signed)
Occupational Therapy Evaluation Patient Details Name: George [name] MRN: [id number] DOB: 11/07/1945 Today's Date: 03/29/2013 Time: 1000-1028 OT Time Calculation (min): 28 min  OT Assessment / Plan / Recommendation History of present illness Pt admitted after being found down of floor by wife.  Pt with seizure activity in ER.  Scan showed CVA vs mass.  MRI with contrast scheduled.   Clinical Impression   Pt with deficits listed below and is most limited by cognition and impulsiveness. Pt would benefit from cont OT to increase safety and I with basic adls so he can d/c home safely with his wife.    OT Assessment  Patient needs continued OT Services    Follow Up Recommendations  Home health OT;Supervision/Assistance - 24 hour;Outpatient OT    Barriers to Discharge      Equipment Recommendations  None recommended by OT    Recommendations for Other Services    Frequency  Min 2X/week    Precautions / Restrictions Precautions Precautions: Fall Precaution Comments: Pt very impulsive; therefore a fall risk. Restrictions Weight Bearing Restrictions: No   Pertinent Vitals/Pain Pt did not c/o pain.    ADL  Eating/Feeding: Performed;Independent Where Assessed - Eating/Feeding: Chair Grooming: Performed;Minimal assistance;Teeth care;Wash/dry hands Where Assessed - Grooming: Supported sitting Upper Body Bathing: Simulated;Set up Where Assessed - Upper Body Bathing: Supported sitting Lower Body Bathing: Simulated;Minimal assistance Where Assessed - Lower Body Bathing: Supported sit to stand Upper Body Dressing: Simulated;Set up Where Assessed - Upper Body Dressing: Supported sitting Lower Body Dressing: Performed;Minimal assistance Where Assessed - Lower Body Dressing: Supported standing Toilet Transfer: Performed;Minimal assistance Statistician Method: Surveyor, minerals: Materials engineer and Hygiene: Performed;Minimal  assistance Where Assessed - Engineer, mining and Hygiene: Standing Transfers/Ambulation Related to ADLs: Pt took a few steps to chair with HHA (min) ADL Comments: Pt does well with most adls.  Showed some mild fine motor deficits donning and doffing socks.  Pt impulsive when standing and with lines.    OT Diagnosis: Generalized weakness;Cognitive deficits  OT Problem List: Decreased strength;Impaired balance (sitting and/or standing);Decreased coordination;Decreased cognition;Decreased safety awareness OT Treatment Interventions: Self-care/ADL training;Therapeutic exercise;Therapeutic activities;Cognitive remediation/compensation   OT Goals(Current goals can be found in the care plan section) Acute Rehab OT Goals Patient Stated Goal: to get on out of here. OT Goal Formulation: With patient/family Time For Goal Achievement: 04/12/13 Potential to Achieve Goals: Good ADL Goals Pt Will Perform Grooming: with modified independence;standing Pt Will Perform Lower Body Dressing: with modified independence;sit to/from stand Pt Will Perform Tub/Shower Transfer: with supervision;ambulating Additional ADL Goal #1: Pt will toilet with mod I on 3:1 over commode. Additional ADL Goal #2: Pt will complete simple money mangement tasks with S.  Visit Information  Last OT Received On: 03/29/13 Assistance Needed: +1 History of Present Illness: Pt admitted after being found down of floor by wife.  Pt with seizure activity in ER.  Scan showed CVA vs mass.  MRI with contrast scheduled.       Prior Functioning     Home Living Family/patient expects to be discharged to:: Private residence Living Arrangements: Spouse/significant other Available Help at Discharge: Available 24 hours/day Type of Home: House Home Access: Stairs to enter [employer] of Steps: 2 Entrance Stairs-Rails: None Home Layout: One level Home Equipment: None Additional Comments: home info per pt.  Not sure  of accuracy due to memory deficits. Prior Function Level of Independence: Independent Communication Communication: No difficulties Dominant Hand: Right  Vision/Perception Vision - History Baseline Vision: Wears glasses all the time Patient Visual Report: No change from baseline Vision - Assessment Vision Assessment: Vision tested Tracking/Visual Pursuits: Able to track stimulus in all quads without difficulty Visual Fields: No apparent deficits   Cognition  Cognition Arousal/Alertness: Awake/alert Behavior During Therapy: Impulsive Overall Cognitive Status: Impaired/Different from baseline Area of Impairment: Orientation;Attention;Memory;Safety/judgement;Problem solving Orientation Level: Time;Disoriented to Current Attention Level: Selective Memory: Decreased recall of precautions Safety/Judgement: Decreased awareness of safety;Decreased awareness of deficits Problem Solving: Slow processing General Comments: Pt very slow to process.  When asked questions, pt changes answers several times and sometimes never gets answer correct.  Pt continually took needle from nurse to check his own sugar but could not get the needle facing the correct way despite cues on 3 different occasions.    Extremity/Trunk Assessment Upper Extremity Assessment Upper Extremity Assessment: RUE deficits/detail RUE Deficits / Details: Pt may have very mild RUE weakness.  Difficult to assess if it is weak or pt not paying attn. when asked to do MMT. RUE Coordination: decreased fine motor Lower Extremity Assessment Lower Extremity Assessment: Defer to PT evaluation Cervical / Trunk Assessment Cervical / Trunk Assessment: Normal     Mobility Bed Mobility Bed Mobility: Supine to Sit;Sitting - Scoot to Edge of Bed Supine to Sit: 5: Supervision Sitting - Scoot to Edge of Bed: 5: Supervision Details for Bed Mobility Assistance: Pt moved well in bed. Transfers Transfers: Sit to Stand;Stand to  Sit Sit to Stand: 4: Min guard Stand to Sit: 4: Min guard Details for Transfer Assistance: Pt mildly unsteady on his feet and again impulsive.     Exercise     Balance Balance Balance Assessed: Yes Static Standing Balance Static Standing - Balance Support: Bilateral upper extremity supported Static Standing - Level of Assistance: 4: Min assist Static Standing - Comment/# of Minutes: 3   End of Session OT - End of Session Activity Tolerance: Patient tolerated treatment well Patient left: in chair;with call bell/phone within reach;with family/visitor present Nurse Communication: Mobility status  GO     Hope Budds 03/29/2013, 10:43 AM [phone]

## 2013-03-29 NOTE — Progress Notes (Signed)
CBG at 1740 was 467 mg/dl.  Stat BMET ordered.  Dr. Butler Denmark text paged.

## 2013-03-29 NOTE — Procedures (Signed)
ELECTROENCEPHALOGRAM REPORT   Patient: George [name]       Room #: 0A54 EEG No. ID: [id number] Age: 67 y.o.        Sex: male Referring Physician: Butler Denmark Report Date:  03/29/2013        Interpreting Physician: Aline Brochure  History: DAGMAWI VENABLE is an 67 y.o. male was admitted following an episode of altered mental status and loss of consciousness thought to possibly be due to seizure activity. 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  Indications for study:    Technique: This is an 18 channel routine scalp EEG performed at the bedside with bipolar and monopolar montages arranged in accordance to the international 10/20 system of electrode placement.   Description: EEG recording was performed during wakefulness as well as during light sleep. Background activity during wakefulness consisted of low amplitude delta activity which is slightly more prominent on the left side compared to the right. There was superimposed 8 Hz alpha rhythm in the posterior head regions. Photic stimulation was not performed. Hyperventilation was not performed. During light sleep bilateral sleep spindles and occasional arousal responses were recorded. No epileptiform discharges are seen.  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 No evidence of an epileptic disorder is demonstrated.   Venetia Maxon M.D. [facility] [phone]

## 2013-03-29 NOTE — Consult Note (Signed)
Cardiology Consult Note No primary provider on file. No ref. provider found  Reason for consult: Positive troponin  History of Present Illness (and review of medical records): George [name] is a 67 y.o. male with history of HLD, DM, CAD s/p PCI to OM and RCA in 2003 followed by Dr. Radford Pax.  He was planned for outpatient cath on 8/26.  He has been unfortunately admitted this evening after being found at home unresponsive by his wife.  In addition patient did have reported seizure activity in the ED aborted with Ativan.  He remains with altered mental status of unclear etiology and unable to provide further history.    There were no reports of chest pain or abnormal ecg.  He did have first troponin 1.23.  Importantly, patient had CT head which revealed abnormality suspicious for possible neoplasm.  He has been evaluated by Neurology.  Cath 10/2001 1. The left main coronary artery is widely patent and bifurcates into a left  anterior descending artery and left circumflex artery.  2. The left anterior descending artery gives rise to a first diagonal which is  occluded proximally. It gives rise to a second diagonal which is widely  patent. The LAD is widely patent throughout its course until distally. It  tapers into a 60% narrowing.  3. The left circumflex is patent throughout the course with luminal  irregularities up to 40% in the mid and distal portion of the vessel.  There is a very large obtuse marginal that comes off which is a very large  branch and has a 90% proximal stenosis prior to bifurcating into two  daughter vessels of which are widely patent. Of note, the posterolateral  artery comes off the distal left circumflex.  4. The right coronary artery is widely patent throughout its course and  distally has an 80-90% stenosis prior to terminating in the posterior  descending artery.  LEFT VENTRICULOGRAPHY: Left ventriculography performed in the 30-degree RAO  view using a total of 30  cc of contrast at 13 cc per second showed mild LV  dysfunction. There was mild hypokinesis of the anterolateral wall. EF  approximately 50%.  S/p PCI to OM and RCA by Dr. Tamala Julian.  Review of Systems Review of systems not obtained due to patient factors.  Patient Active Problem List   Diagnosis Date Noted  . Acute encephalopathy 03/29/2013  . Non-ST elevation MI (NSTEMI) 03/29/2013  . Diabetes mellitus 03/29/2013  . Hyperlipidemia 03/29/2013  . Seizure 03/29/2013   Past Medical History  Diagnosis Date  . Coronary artery disease   . Diabetes mellitus without complication   . Hyperlipidemia     Past Surgical History  Procedure Laterality Date  . Cardiac catheterization      No prescriptions prior to admission   No Known Allergies  History  Substance Use Topics  . Smoking status: Unknown If Ever Smoked  . Smokeless tobacco: Not on file  . Alcohol Use: Not on file    No family history on file.   Objective:  Patient Vitals for the past 8 hrs:  BP Temp Temp src Pulse Resp SpO2  03/29/13 0200 133/66 mmHg - - 91 22 96 %  03/29/13 0000 124/55 mmHg - - 89 20 96 %  03/28/13 2253 132/64 mmHg 98.5 F (36.9 C) Oral 89 21 98 %  03/28/13 2215 105/74 mmHg - - 93 20 100 %  03/28/13 2200 120/67 mmHg - - 95 19 100 %  03/28/13 2145 133/74 mmHg - -  100 22 100 %  03/28/13 2130 136/70 mmHg - - 102 23 100 %  03/28/13 2115 143/79 mmHg - - 103 26 100 %  03/28/13 2112 141/78 mmHg - - 101 20 100 %  03/28/13 2033 - 98.7 F (37.1 C) - - - -  03/28/13 1915 133/68 mmHg - - 103 26 98 %  03/28/13 1900 147/70 mmHg - - 105 26 96 %  03/28/13 1859 138/66 mmHg - - 105 33 96 %   General appearance: alert, not oriented to person, place or time Neck: neck brace in place Lungs: clear to auscultation bilaterally Chest wall: no tenderness Heart: regular rate and rhythm, S1, S2 normal, Abdomen: soft, non-tender; bowel sounds normal; no masses,  no organomegaly Extremities: extremities normal,  atraumatic, no cyanosis or edema Pulses: 2+ and symmetric Neurologic: able to follow simple commands  Results for orders placed during the hospital encounter of 03/28/13 (from the past 48 hour(s))  CBC WITH DIFFERENTIAL     Status: Abnormal   Collection Time    03/28/13  7:04 PM      Result Value Range   WBC 10.7 (*) 4.0 - 10.5 K/uL   RBC 4.05 (*) 4.22 - 5.81 MIL/uL   Hemoglobin 12.0 (*) 13.0 - 17.0 g/dL   HCT 34.2 (*) 39.0 - 52.0 %   MCV 84.4  78.0 - 100.0 fL   MCH 29.6  26.0 - 34.0 pg   MCHC 35.1  30.0 - 36.0 g/dL   RDW 13.7  11.5 - 15.5 %   Platelets 210  150 - 400 K/uL   Neutrophils Relative % 83 (*) 43 - 77 %   Neutro Abs 8.8 (*) 1.7 - 7.7 K/uL   Lymphocytes Relative 9 (*) 12 - 46 %   Lymphs Abs 0.9  0.7 - 4.0 K/uL   Monocytes Relative 8  3 - 12 %   Monocytes Absolute 0.8  0.1 - 1.0 K/uL   Eosinophils Relative 1  0 - 5 %   Eosinophils Absolute 0.1  0.0 - 0.7 K/uL   Basophils Relative 0  0 - 1 %   Basophils Absolute 0.0  0.0 - 0.1 K/uL  COMPREHENSIVE METABOLIC PANEL     Status: Abnormal   Collection Time    03/28/13  7:04 PM      Result Value Range   Sodium 137  135 - 145 mEq/L   Potassium 4.4  3.5 - 5.1 mEq/L   Chloride 102  96 - 112 mEq/L   CO2 25  19 - 32 mEq/L   Glucose, Bld 241 (*) 70 - 99 mg/dL   BUN 27 (*) 6 - 23 mg/dL   Creatinine, Ser 0.87  0.50 - 1.35 mg/dL   Calcium 10.7 (*) 8.4 - 10.5 mg/dL   Total Protein 7.0  6.0 - 8.3 g/dL   Albumin 3.4 (*) 3.5 - 5.2 g/dL   AST 17  0 - 37 U/L   ALT 19  0 - 53 U/L   Alkaline Phosphatase 96  39 - 117 U/L   Total Bilirubin 0.3  0.3 - 1.2 mg/dL   GFR calc non Af Amer 88 (*) >90 mL/min   GFR calc Af Amer >90  >90 mL/min   Comment: (NOTE)     The eGFR has been calculated using the CKD EPI equation.     This calculation has not been validated in all clinical situations.     eGFR's persistently <90 mL/min signify possible Chronic Kidney  Disease.  TROPONIN I     Status: Abnormal   Collection Time    03/28/13  7:04  PM      Result Value Range   Troponin I 1.23 (*) <0.30 ng/mL   Comment:            Due to the release kinetics of cTnI,     a negative result within the first hours     of the onset of symptoms does not rule out     myocardial infarction with certainty.     If myocardial infarction is still suspected,     repeat the test at appropriate intervals.     CRITICAL RESULT CALLED TO, READ BACK BY AND VERIFIED WITH:     A MCKEWON,RN [id number] [address] [place]  GLUCOSE, CAPILLARY     Status: Abnormal   Collection Time    03/28/13 10:53 PM      Result Value Range   Glucose-Capillary 173 (*) 70 - 99 mg/dL  TROPONIN I     Status: Abnormal   Collection Time    03/29/13  1:30 AM      Result Value Range   Troponin I 1.68 (*) <0.30 ng/mL   Comment:            Due to the release kinetics of cTnI,     a negative result within the first hours     of the onset of symptoms does not rule out     myocardial infarction with certainty.     If myocardial infarction is still suspected,     repeat the test at appropriate intervals.     REPEATED TO VERIFY     CRITICAL VALUE NOTED.  VALUE IS CONSISTENT WITH PREVIOUSLY REPORTED AND CALLED VALUE.   Dg Chest 2 View  03/28/2013   *RADIOLOGY REPORT*  Clinical Data: Altered mental status, fall  CHEST - 2 VIEW  Comparison: None.  Findings: Cardiac leads overlie the chest.  Heart size is normal. Lung volumes are low but clear.  No pleural effusion.  No acute osseous finding. Mild central bronchial wall thickening noted.  IMPRESSION: No focal pulmonary opacity.  Mild central bronchial wall thickening which may indicate reactive airways disease or bronchitis.   Original Report Authenticated By: Conchita Paris, M.D.   Ct Head Wo Contrast  03/28/2013   *RADIOLOGY REPORT*  Clinical Data:  Altered mental status and fall  CT HEAD WITHOUT CONTRAST CT CERVICAL SPINE WITHOUT CONTRAST  Technique:  Multidetector CT imaging of the head and cervical spine was performed following the  standard protocol without intravenous contrast.  Multiplanar CT image reconstructions of the cervical spine were also generated.  Comparison:  CT 03/26/1999 is not digitized.  Report is reviewed.  CT HEAD  Findings: There is a large area of abnormal hypo attenuation within the gray matter and extending to the cortical surface in the left frontoparietal lobe region.  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  No acute hemorrhage is seen.  No sulcal effacement.  No midline shift.  No skull fracture. Orbits and paranasal sinuses are intact.  IMPRESSION: Abnormal area of predominately white matter left frontoparietal lobe hypodensity, which may indicate acute versus subacute infarct. Infiltrative neoplasm such as glioblastoma multiforme is possible but considered less likely given absence of focal effacement.  No acute hemorrhage.  CT CERVICAL SPINE  Findings: C1 through the cervical thoracic junction is visualized in its entirety. No precervical soft tissue widening is present. No fracture or  dislocation.  Alignment is normal.  IMPRESSION: No acute cervical spine abnormality.  Critical Value/emergent results were called by telephone at the time of interpretation on 03/28/2013 at 8:25 p.m. to Dr. Criss Alvine, who verbally acknowledged these results.   Original Report Authenticated By: Christiana Pellant, M.D.   Ct Cervical Spine Wo Contrast  03/28/2013   *RADIOLOGY REPORT*  Clinical Data:  Altered mental status and fall  CT HEAD WITHOUT CONTRAST CT CERVICAL SPINE WITHOUT CONTRAST  Technique:  Multidetector CT imaging of the head and cervical spine was performed following the standard protocol without intravenous contrast.  Multiplanar CT image reconstructions of the cervical spine were also generated.  Comparison:  CT 03/26/1999 is not digitized.  Report is reviewed.  CT HEAD  Findings: There is a large area of abnormal hypo attenuation within the gray matter and extending to the cortical  surface in the left frontoparietal lobe region.  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  No acute hemorrhage is seen.  No sulcal effacement.  No midline shift.  No skull fracture. Orbits and paranasal sinuses are intact.  IMPRESSION: Abnormal area of predominately white matter left frontoparietal lobe hypodensity, which may indicate acute versus subacute infarct. Infiltrative neoplasm such as glioblastoma multiforme is possible but considered less likely given absence of focal effacement.  No acute hemorrhage.  CT CERVICAL SPINE  Findings: C1 through the cervical thoracic junction is visualized in its entirety. No precervical soft tissue widening is present. No fracture or dislocation.  Alignment is normal.  IMPRESSION: No acute cervical spine abnormality.  Critical Value/emergent results were called by telephone at the time of interpretation on 03/28/2013 at 8:25 p.m. to Dr. Criss Alvine, who verbally acknowledged these results.   Original Report Authenticated By: Christiana Pellant, M.D.    ECG:  Sinus tachycardia, probable right ventricular conduction delay, no acute ischemic changes,  Impression: Nonspecific troponin elevation Seizure Brain mass of unclear etiology  No clear signs or reported symptoms to suggestive acute coronary syndrome.  Recommendations: -Agree with neurology recommendations to further evaluated mass and etiology of encephalopathy, seizure activity - Monitor on telemetry, ecg prn for rhythm change or symptoms - Trend troponins - Given unclear brain mass with possible edema, acute infarct, would hold on anticoagulation at this time. - Will also likely defer on cardiac catheterization tomorrow.  Thank you for this consultation.  We will follow along with you and make further recommendations pending clinical course and results from initial studies.

## 2013-03-30 ENCOUNTER — Encounter (HOSPITAL_COMMUNITY): Payer: Self-pay | Admitting: *Deleted

## 2013-03-30 ENCOUNTER — Inpatient Hospital Stay (HOSPITAL_COMMUNITY): Payer: Medicare Other

## 2013-03-30 LAB — GLUCOSE, CAPILLARY
Glucose-Capillary: 471 mg/dL — ABNORMAL HIGH (ref 70–99)
Glucose-Capillary: 388 mg/dL — ABNORMAL HIGH (ref 70–99)

## 2013-03-30 LAB — PARATHYROID HORMONE, INTACT (NO CA): PTH: 3.6 pg/mL — ABNORMAL LOW (ref 14.0–72.0)

## 2013-03-30 MED ORDER — GADOBENATE DIMEGLUMINE 529 MG/ML IV SOLN
20.0000 mL | Freq: Once | INTRAVENOUS | Status: AC
  Administered 2013-03-30: 20 mL via INTRAVENOUS

## 2013-03-30 MED ORDER — OMEGA-3-ACID ETHYL ESTERS 1 G PO CAPS
1.0000 g | ORAL_CAPSULE | Freq: Two times a day (BID) | ORAL | Status: DC
  Administered 2013-03-30 – 2013-04-05 (×13): 1 g via ORAL
  Filled 2013-03-30 (×15): qty 1

## 2013-03-30 MED ORDER — PANTOPRAZOLE SODIUM 40 MG PO TBEC
40.0000 mg | DELAYED_RELEASE_TABLET | Freq: Every day | ORAL | Status: DC
  Administered 2013-03-30 – 2013-04-05 (×7): 40 mg via ORAL
  Filled 2013-03-30 (×6): qty 1

## 2013-03-30 MED ORDER — SODIUM CHLORIDE 0.9 % IV SOLN
INTRAVENOUS | Status: DC
  Administered 2013-03-31: 1000 mL via INTRAVENOUS
  Administered 2013-04-01: 50 mL/h via INTRAVENOUS

## 2013-03-30 MED ORDER — ATORVASTATIN CALCIUM 40 MG PO TABS
40.0000 mg | ORAL_TABLET | Freq: Every day | ORAL | Status: DC
  Administered 2013-03-30 – 2013-03-31 (×2): 40 mg via ORAL
  Filled 2013-03-30 (×3): qty 1

## 2013-03-30 MED ORDER — IOHEXOL 300 MG/ML  SOLN
25.0000 mL | INTRAMUSCULAR | Status: AC
  Administered 2013-03-30 (×2): 25 mL via ORAL

## 2013-03-30 MED ORDER — INSULIN ASPART 100 UNIT/ML ~~LOC~~ SOLN
0.0000 [IU] | Freq: Every day | SUBCUTANEOUS | Status: DC
  Administered 2013-03-30 – 2013-03-31 (×2): 5 [IU] via SUBCUTANEOUS
  Administered 2013-04-01 – 2013-04-02 (×2): 4 [IU] via SUBCUTANEOUS

## 2013-03-30 MED ORDER — OMEGA-3 FATTY ACIDS 1000 MG PO CAPS: 1.0000 g | ORAL_CAPSULE | Freq: Every day | ORAL | Status: DC

## 2013-03-30 MED ORDER — INSULIN GLARGINE 100 UNIT/ML ~~LOC~~ SOLN
60.0000 [IU] | Freq: Two times a day (BID) | SUBCUTANEOUS | Status: DC
  Filled 2013-03-30: qty 0.6

## 2013-03-30 MED ORDER — LEVETIRACETAM 500 MG PO TABS
500.0000 mg | ORAL_TABLET | Freq: Two times a day (BID) | ORAL | Status: DC
  Administered 2013-03-30 – 2013-04-05 (×13): 500 mg via ORAL
  Filled 2013-03-30 (×14): qty 1

## 2013-03-30 MED ORDER — INSULIN ASPART 100 UNIT/ML ~~LOC~~ SOLN
0.0000 [IU] | Freq: Three times a day (TID) | SUBCUTANEOUS | Status: DC
  Administered 2013-03-30: 12 [IU] via SUBCUTANEOUS
  Administered 2013-03-31: 7 [IU] via SUBCUTANEOUS
  Administered 2013-03-31: 15 [IU] via SUBCUTANEOUS
  Administered 2013-03-31: 19:00:00 via SUBCUTANEOUS
  Administered 2013-04-01: 20 [IU] via SUBCUTANEOUS
  Administered 2013-04-01: 11 [IU] via SUBCUTANEOUS
  Administered 2013-04-01: 7 [IU] via SUBCUTANEOUS
  Administered 2013-04-02 (×2): 4 [IU] via SUBCUTANEOUS
  Administered 2013-04-02: 11 [IU] via SUBCUTANEOUS
  Administered 2013-04-03: 7 [IU] via SUBCUTANEOUS
  Administered 2013-04-03: 4 [IU] via SUBCUTANEOUS
  Administered 2013-04-03: 15 [IU] via SUBCUTANEOUS
  Administered 2013-04-04: 4 [IU] via SUBCUTANEOUS
  Administered 2013-04-04: 7 [IU] via SUBCUTANEOUS
  Administered 2013-04-05: 3 [IU] via SUBCUTANEOUS

## 2013-03-30 MED ORDER — INSULIN GLARGINE 100 UNIT/ML ~~LOC~~ SOLN
40.0000 [IU] | Freq: Two times a day (BID) | SUBCUTANEOUS | Status: DC
  Administered 2013-03-30 – 2013-03-31 (×3): 40 [IU] via SUBCUTANEOUS
  Filled 2013-03-30 (×5): qty 0.4

## 2013-03-30 NOTE — Progress Notes (Signed)
Subjective: No further seizure activity noted.  Patient on Keppra with no side effects noted.  CT shows an abnormal hypodensity on the left.  Etiology unclear but concerned for mass lesion.    Objective: Current vital signs: BP 104/50  Pulse 78  Temp(Src) 97.6 F (36.4 C) (Axillary)  Resp 17  Wt 96.3 kg (212 lb 4.9 oz)  SpO2 96% Vital signs in last 24 hours: Temp:  [97.6 F (36.4 C)-98.2 F (36.8 C)] 97.6 F (36.4 C) (08/27 0739) Pulse Rate:  [78-81] 78 (08/27 0400) Resp:  [17-26] 17 (08/27 0739) BP: (104-125)/(50-57) 104/50 mmHg (08/27 0739) SpO2:  [93 %-99 %] 96 % (08/27 0739)  Intake/Output from previous day: 08/26 0701 - 08/27 0700 In: 840 [P.O.:240; I.V.:600] Out: 750 [Urine:750] Intake/Output this shift: Total I/O In: 100 [I.V.:100] Out: -  Nutritional status: General  Neurologic Exam: Mental Status: Alert, oriented, thought content appropriate.  Speech fluent without evidence of aphasia.  Able to follow 3 step commands without difficulty. Cranial Nerves: II: Discs flat bilaterally; Visual fields grossly normal, pupils equal, round, reactive to light and accommodation III,IV, VI: ptosis not present, extra-ocular motions intact bilaterally V,VII: decrease in right NLF, facial light touch sensation normal bilaterally VIII: hearing normal bilaterally IX,X: gag reflex present XI: bilateral shoulder shrug XII: midline tongue extension Motor: Right : Upper extremity   5/5    Left:     Upper extremity   5/5  Lower extremity   5/5     Lower extremity   5/5 Tone and bulk:normal tone throughout; no atrophy noted Sensory: Pinprick and light touch intact throughout, bilaterally Deep Tendon Reflexes: 2+ and symmetric throughout Plantars: Right: downgoing   Left: downgoing  Lab Results: Basic Metabolic Panel:  Recent Labs Lab 03/28/13 1904 03/29/13 0700 03/29/13 1745  NA 137 138 134*  K 4.4 4.1 4.6  CL 102 104 99  CO2 25 22 25   GLUCOSE 241* 268* 471*  BUN 27*  23 29*  CREATININE 0.87 0.75 1.08  CALCIUM 10.7* 10.3 10.1    Liver Function Tests:  Recent Labs Lab 03/28/13 1904 03/29/13 0700  AST 17 23  ALT 19 17  ALKPHOS 96 95  BILITOT 0.3 0.4  PROT 7.0 7.2  ALBUMIN 3.4* 3.4*   No results found for this basename: LIPASE, AMYLASE,  in the last 168 hours No results found for this basename: AMMONIA,  in the last 168 hours  CBC:  Recent Labs Lab 03/28/13 1904 03/29/13 0700  WBC 10.7* 6.9  NEUTROABS 8.8* 6.1  HGB 12.0* 12.0*  HCT 34.2* 35.7*  MCV 84.4 84.4  PLT 210 181    Cardiac Enzymes:  Recent Labs Lab 03/28/13 1904 03/29/13 0130 03/29/13 0700 03/29/13 1429  TROPONINI 1.23* 1.68* 1.32* 0.95*    Lipid Panel:  Recent Labs Lab 03/29/13 0700  CHOL 129  TRIG 46  HDL 35*  CHOLHDL 3.7  VLDL 9  LDLCALC 85    CBG:  Recent Labs Lab 03/28/13 2253 03/29/13 0959 03/29/13 1025 03/29/13 1740 03/30/13 0816  GLUCAP 173* 297* 298* 467* 384*    Microbiology: Results for orders placed during the hospital encounter of 03/28/13  MRSA PCR SCREENING     Status: None   Collection Time    03/29/13  1:10 AM      Result Value Range Status   MRSA by PCR NEGATIVE  NEGATIVE Final   Comment:            The GeneXpert MRSA Assay (FDA  approved for NASAL specimens     only), is one component of a     comprehensive MRSA colonization     surveillance program. It is not     intended to diagnose MRSA     infection nor to guide or     monitor treatment for     MRSA infections.    Coagulation Studies: No results found for this basename: LABPROT, INR,  in the last 72 hours  Imaging: Dg Chest 2 View  03/28/2013   *RADIOLOGY REPORT*  Clinical Data: Altered mental status, fall  CHEST - 2 VIEW  Comparison: None.  Findings: Cardiac leads overlie the chest.  Heart size is normal. Lung volumes are low but clear.  No pleural effusion.  No acute osseous finding. Mild central bronchial wall thickening noted.  IMPRESSION: No focal  pulmonary opacity.  Mild central bronchial wall thickening which may indicate reactive airways disease or bronchitis.   Original Report Authenticated By: Conchita Paris, M.D.   Ct Head Wo Contrast  03/28/2013   *RADIOLOGY REPORT*  Clinical Data:  Altered mental status and fall  CT HEAD WITHOUT CONTRAST CT CERVICAL SPINE WITHOUT CONTRAST  Technique:  Multidetector CT imaging of the head and cervical spine was performed following the standard protocol without intravenous contrast.  Multiplanar CT image reconstructions of the cervical spine were also generated.  Comparison:  CT 03/26/1999 is not digitized.  Report is reviewed.  CT HEAD  Findings: There is a large area of abnormal hypo attenuation within the gray matter and extending to the cortical surface in the left frontoparietal lobe region.  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  No acute hemorrhage is seen.  No sulcal effacement.  No midline shift.  No skull fracture. Orbits and paranasal sinuses are intact.  IMPRESSION: Abnormal area of predominately white matter left frontoparietal lobe hypodensity, which may indicate acute versus subacute infarct. Infiltrative neoplasm such as glioblastoma multiforme is possible but considered less likely given absence of focal effacement.  No acute hemorrhage.  CT CERVICAL SPINE  Findings: C1 through the cervical thoracic junction is visualized in its entirety. No precervical soft tissue widening is present. No fracture or dislocation.  Alignment is normal.  IMPRESSION: No acute cervical spine abnormality.  Critical Value/emergent results were called by telephone at the time of interpretation on 03/28/2013 at 8:25 p.m. to Dr. Regenia Skeeter, who verbally acknowledged these results.   Original Report Authenticated By: Conchita Paris, M.D.   Ct Cervical Spine Wo Contrast  03/28/2013   *RADIOLOGY REPORT*  Clinical Data:  Altered mental status and fall  CT HEAD WITHOUT CONTRAST CT CERVICAL  SPINE WITHOUT CONTRAST  Technique:  Multidetector CT imaging of the head and cervical spine was performed following the standard protocol without intravenous contrast.  Multiplanar CT image reconstructions of the cervical spine were also generated.  Comparison:  CT 03/26/1999 is not digitized.  Report is reviewed.  CT HEAD  Findings: There is a large area of abnormal hypo attenuation within the gray matter and extending to the cortical surface in the left frontoparietal lobe region.  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  No acute hemorrhage is seen.  No sulcal effacement.  No midline shift.  No skull fracture. Orbits and paranasal sinuses are intact.  IMPRESSION: Abnormal area of predominately white matter left frontoparietal lobe hypodensity, which may indicate acute versus subacute infarct. Infiltrative neoplasm such as glioblastoma multiforme is possible but considered less likely given absence of focal  effacement.  No acute hemorrhage.  CT CERVICAL SPINE  Findings: C1 through the cervical thoracic junction is visualized in its entirety. No precervical soft tissue widening is present. No fracture or dislocation.  Alignment is normal.  IMPRESSION: No acute cervical spine abnormality.  Critical Value/emergent results were called by telephone at the time of interpretation on 03/28/2013 at 8:25 p.m. to Dr. Regenia Skeeter, who verbally acknowledged these results.   Original Report Authenticated By: Conchita Paris, M.D.    Medications:  I have reviewed the patient's current medications. Scheduled: . dexamethasone  6 mg Intravenous Q6H  . insulin aspart  0-15 Units Subcutaneous TID WC  . insulin aspart  0-5 Units Subcutaneous QHS  . insulin glargine  70 Units Subcutaneous QHS  . levETIRAcetam  500 mg Intravenous Q12H    Assessment/Plan: 67 year old presenting with new onset seizures.  On Keppra and tolerating well.  EEG shows some mild focality but no evidence of  epileptiform activity.  Further imaging required.    Recommendations: 1.  MRI of the brain to be performed today.   2.  Change Keppra to po dosing   3.  Continue Decadron 4.  Continue seizure precautions   LOS: 2 days   Alexis Goodell, MD [facility] [phone] 03/30/2013  10:49 AM

## 2013-03-30 NOTE — Progress Notes (Signed)
SUBJECTIVE:  Denies any chest pain or SOB  OBJECTIVE:   Vitals:   Filed Vitals:   03/29/13 2000 03/30/13 0000 03/30/13 0400 03/30/13 0739  BP: 110/52 125/57 111/52 104/50  Pulse: 80 81 78   Temp: 98.2 F (36.8 C) 97.6 F (36.4 C) 97.9 F (36.6 C) 97.6 F (36.4 C)  TempSrc: Oral Oral Oral Axillary  Resp: 26 21 18 17   Weight:      SpO2: 93% 99% 97% 96%   I&O's:   Intake/Output Summary (Last 24 hours) at 03/30/13 1610 Last data filed at 03/30/13 0800  Gross per 24 hour  Intake    840 ml  Output    500 ml  Net    340 ml   TELEMETRY: Reviewed telemetry pt in NSR:     PHYSICAL EXAM General: Well developed, well nourished, in no acute distress Head: Eyes PERRLA, No xanthomas.   Normal cephalic and atramatic  Lungs:   Clear bilaterally to auscultation and percussion. Heart:   HRRR S1 S2 Pulses are 2+ & equal. Abdomen: Bowel sounds are positive, abdomen soft and non-tender without masses Extremities:   No clubbing, cyanosis or edema.  DP +1 Neuro: Alert and oriented X 3. Psych:  Good affect, responds appropriately   LABS: Basic Metabolic Panel:  Recent Labs  03/29/13 0700 03/29/13 1745  NA 138 134*  K 4.1 4.6  CL 104 99  CO2 22 25  GLUCOSE 268* 471*  BUN 23 29*  CREATININE 0.75 1.08  CALCIUM 10.3 10.1   Liver Function Tests:  Recent Labs  03/28/13 1904 03/29/13 0700  AST 17 23  ALT 19 17  ALKPHOS 96 95  BILITOT 0.3 0.4  PROT 7.0 7.2  ALBUMIN 3.4* 3.4*   No results found for this basename: LIPASE, AMYLASE,  in the last 72 hours CBC:  Recent Labs  03/28/13 1904 03/29/13 0700  WBC 10.7* 6.9  NEUTROABS 8.8* 6.1  HGB 12.0* 12.0*  HCT 34.2* 35.7*  MCV 84.4 84.4  PLT 210 181   Cardiac Enzymes:  Recent Labs  03/29/13 0130 03/29/13 0700 03/29/13 1429  TROPONINI 1.68* 1.32* 0.95*   BNP: No components found with this basename: POCBNP,  D-Dimer: No results found for this basename: DDIMER,  in the last 72 hours Hemoglobin A1C:  Recent  Labs  03/29/13 0700  HGBA1C 7.4*   Fasting Lipid Panel:  Recent Labs  03/29/13 0700  CHOL 129  HDL 35*  LDLCALC 85  TRIG 46  CHOLHDL 3.7   Thyroid Function Tests: No results found for this basename: TSH, T4TOTAL, FREET3, T3FREE, THYROIDAB,  in the last 72 hours Anemia Panel: No results found for this basename: VITAMINB12, FOLATE, FERRITIN, TIBC, IRON, RETICCTPCT,  in the last 72 hours Coag Panel:   No results found for this basename: INR, PROTIME    RADIOLOGY: Dg Chest 2 View  03/28/2013   *RADIOLOGY REPORT*  Clinical Data: Altered mental status, fall  CHEST - 2 VIEW  Comparison: None.  Findings: Cardiac leads overlie the chest.  Heart size is normal. Lung volumes are low but clear.  No pleural effusion.  No acute osseous finding. Mild central bronchial wall thickening noted.  IMPRESSION: No focal pulmonary opacity.  Mild central bronchial wall thickening which may indicate reactive airways disease or bronchitis.   Original Report Authenticated By: Conchita Paris, M.D.   Ct Head Wo Contrast  03/28/2013   *RADIOLOGY REPORT*  Clinical Data:  Altered mental status and fall  CT HEAD WITHOUT CONTRAST  CT CERVICAL SPINE WITHOUT CONTRAST  Technique:  Multidetector CT imaging of the head and cervical spine was performed following the standard protocol without intravenous contrast.  Multiplanar CT image reconstructions of the cervical spine were also generated.  Comparison:  CT 03/26/1999 is not digitized.  Report is reviewed.  CT HEAD  Findings: There is a large area of abnormal hypo attenuation within the gray matter and extending to the cortical surface in the left frontoparietal lobe region.  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  No acute hemorrhage is seen.  No sulcal effacement.  No midline shift.  No skull fracture. Orbits and paranasal sinuses are intact.  IMPRESSION: Abnormal area of predominately white matter left frontoparietal lobe hypodensity,  which may indicate acute versus subacute infarct. Infiltrative neoplasm such as glioblastoma multiforme is possible but considered less likely given absence of focal effacement.  No acute hemorrhage.  CT CERVICAL SPINE  Findings: C1 through the cervical thoracic junction is visualized in its entirety. No precervical soft tissue widening is present. No fracture or dislocation.  Alignment is normal.  IMPRESSION: No acute cervical spine abnormality.  Critical Value/emergent results were called by telephone at the time of interpretation on 03/28/2013 at 8:25 p.m. to Dr. Regenia Skeeter, who verbally acknowledged these results.   Original Report Authenticated By: Conchita Paris, M.D.   Ct Cervical Spine Wo Contrast  03/28/2013   *RADIOLOGY REPORT*  Clinical Data:  Altered mental status and fall  CT HEAD WITHOUT CONTRAST CT CERVICAL SPINE WITHOUT CONTRAST  Technique:  Multidetector CT imaging of the head and cervical spine was performed following the standard protocol without intravenous contrast.  Multiplanar CT image reconstructions of the cervical spine were also generated.  Comparison:  CT 03/26/1999 is not digitized.  Report is reviewed.  CT HEAD  Findings: There is a large area of abnormal hypo attenuation within the gray matter and extending to the cortical surface in the left frontoparietal lobe region.  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  No acute hemorrhage is seen.  No sulcal effacement.  No midline shift.  No skull fracture. Orbits and paranasal sinuses are intact.  IMPRESSION: Abnormal area of predominately white matter left frontoparietal lobe hypodensity, which may indicate acute versus subacute infarct. Infiltrative neoplasm such as glioblastoma multiforme is possible but considered less likely given absence of focal effacement.  No acute hemorrhage.  CT CERVICAL SPINE  Findings: C1 through the cervical thoracic junction is visualized in its entirety. No precervical  soft tissue widening is present. No fracture or dislocation.  Alignment is normal.  IMPRESSION: No acute cervical spine abnormality.  Critical Value/emergent results were called by telephone at the time of interpretation on 03/28/2013 at 8:25 p.m. to Dr. Regenia Skeeter, who verbally acknowledged these results.   Original Report Authenticated By: Conchita Paris, M.D.   Impression:  Nonspecific troponin elevation  Seizure  Brain mass of unclear etiology  No clear signs or reported symptoms to suggestive acute coronary syndrome.  Recommendations:  -Agree with neurology recommendations to further evaluated mass and etiology of encephalopathy, seizure activity - MRI results pending - Monitor on telemetry, ecg prn for rhythm change or symptoms  - Trend troponins  - Given unclear brain mass with possible edema, acute infarct, would hold on anticoagulation at this time.  - Will defer on cardiac catheterization for now      Sueanne Margarita, MD  03/30/2013  9:39 AM

## 2013-03-30 NOTE — Progress Notes (Signed)
SLP Cancellation Note  Patient Details Name: George [name] MRN: [id number] DOB: Mar 20, 1946   Cancelled treatment:        Attempted to see this morning however he was in MRI.  Will see tomorrow.   Royce Macadamia 03/30/2013, 2:51 PM

## 2013-03-30 NOTE — Evaluation (Signed)
Physical Therapy Evaluation Patient Details Name: George [name] MRN: [id number] DOB: Dec 16, 1945 Today's Date: 03/30/2013 Time: 8657-8469 PT Time Calculation (min): 16 min  PT Assessment / Plan / Recommendation History of Present Illness  Pt admitted after being found down of floor by wife.  Pt with seizure activity in ER.  Scan showed CVA vs mass.  MRI with contrast scheduled.  Clinical Impression  Pt Mod I with all mobility and per wife pt seems back to normal.  Encouraged pt to amb with Nsg staff 3-4x per day to maintain mobility.  No further PT needs at this time.      PT Assessment  Patent does not need any further PT services    Follow Up Recommendations  No PT follow up    Does the patient have the potential to tolerate intense rehabilitation      Barriers to Discharge        Equipment Recommendations  None recommended by PT    Recommendations for Other Services     Frequency      Precautions / Restrictions Precautions Precautions: Fall Precaution Comments: Pt very impulsive; therefore a fall risk. Restrictions Weight Bearing Restrictions: No   Pertinent Vitals/Pain Denies pain.        Mobility  Bed Mobility Bed Mobility: Not assessed Transfers Transfers: Sit to Stand;Stand to Sit Sit to Stand: 6: Modified independent (Device/Increase time);With upper extremity assist;From chair/3-in-1 Stand to Sit: 6: Modified independent (Device/Increase time);With upper extremity assist;To chair/3-in-1 Details for Transfer Assistance: Much more steady today than for OT eval yesterday.   Ambulation/Gait Ambulation/Gait Assistance: 6: Modified independent (Device/Increase time) Ambulation Distance (Feet): 500 Feet Assistive device: None Ambulation/Gait Assistance Details: pt moves slower than normal, however demos good balance and no LOB.   Gait Pattern: Step-through pattern;Decreased stride length Stairs: No Wheelchair Mobility Wheelchair Mobility: No    Exercises      PT Diagnosis:    PT Problem List:   PT Treatment Interventions:       PT Goals(Current goals can be found in the care plan section) Acute Rehab PT Goals Patient Stated Goal: to get on out of here.  Visit Information  Last PT Received On: 03/30/13 Assistance Needed: +1 Reason Eval/Treat Not Completed: Patient at procedure or test/unavailable History of Present Illness: Pt admitted after being found down of floor by wife.  Pt with seizure activity in ER.  Scan showed CVA vs mass.  MRI with contrast scheduled.       Prior Functioning  Home Living Family/patient expects to be discharged to:: Private residence Living Arrangements: Spouse/significant other Available Help at Discharge: Available 24 hours/day Type of Home: House Home Access: Stairs to enter [employer] of Steps: 2 Entrance Stairs-Rails: None Home Layout: One level Home Equipment: None Additional Comments: home info per pt.  Not sure of accuracy due to memory deficits. Prior Function Level of Independence: Independent Communication Communication: No difficulties Dominant Hand: Right    Cognition  Cognition Arousal/Alertness: Awake/alert Behavior During Therapy: WFL for tasks assessed/performed Overall Cognitive Status: Impaired/Different from baseline Area of Impairment: Orientation;Memory Orientation Level: Disoriented to;Time Memory: Decreased short-term memory    Extremity/Trunk Assessment Upper Extremity Assessment Upper Extremity Assessment: Defer to OT evaluation Lower Extremity Assessment Lower Extremity Assessment: Overall WFL for tasks assessed Cervical / Trunk Assessment Cervical / Trunk Assessment: Normal   Balance Balance Balance Assessed: Yes Static Standing Balance Static Standing - Balance Support: No upper extremity supported Static Standing - Level of Assistance: 6: Modified independent (Device/Increase  time)  End of Session PT - End of Session Equipment Utilized During  Treatment: Gait belt Activity Tolerance: Patient tolerated treatment well Patient left: in chair;with call bell/phone within reach;with family/visitor present Nurse Communication: Mobility status  GP     Sunny Schlein, [place] [phone] 03/30/2013, 2:37 PM

## 2013-03-30 NOTE — Progress Notes (Signed)
[facility] Progress Note [place] TEAM 1 - Stepdown/ICU TEAM   George [name] ZOX:[id number] DOB: 12-18-1945 DOA: 03/28/2013 PCP: No primary provider on file.  Brief narrative:  67 y.o. male w/ a history of CAD status post stent, diabetes mellitus type 2, and hyperlipidemia who was found on the floor in his house by his wife that around 6 PM. Patient was found to be initially confused but was arousable. Patient had incontinence of urine. Later patient was able to stand up. Did not have any focal deficits. The last seen normal was on 2 PM. Patient was brought to the ER. CT head showed features concerning for mass versus stroke. While in the ER the patient was seen to have a focal seizure with right eye deviation and tonic clonic activity. Neurologist on-call Dr. Amada Jupiter evaluated the patient and the patient was started on Keppra and Decadron for possible seizures and brain mass. Family further described a several day history of difficulty finding his words, and forgetting what he was going to say.  Additionally, patient's troponin was found to be elevated with EKG showing no ST elevations. On-call Cardiologist was consulted by ER physician. Patient did have some chest discomfort the week prior to his admit, and was originally scheduled for cardiac catheter the day of his admission.   Assessment/Plan:  Focal Seizure - found unresponsive by wife - suspected to be due to seizures - placed on Keppra for now - likely due to below  Edema vs/ mass in Left Frontal lobe - difficulty with word finding per wife and patient, which has reportedly been progressive for weeks (neighbors noted prior to family in retrospect) - Decadron started - MRI pending - EEG reveals "mild generalized slowing of cerebral activity which was slightly more prominent involving the left hemisphere compared to the right, consistent with patient's history of probable left cerebral mass lesion."  Nonspecific troponin  elevation  - care as per [facility] - was due for a cath but due to above finding, cath has been postponed (cannot give blood thinners)  - was taking ASA - will not resume this for now  Diabetes mellitus - takes lantus 60 U at bedtime (though not listed on med rec) - CBG poorly controlled due to steroid - adjust tx and follow   Hyperlipidemia - takes fish oil at home   Code Status: FULL Family Communication: care discussed w/ pt and wife at bedside at length  Disposition Plan: SDU  Consultants: Neuro Cardio  Procedures: none  Antibiotics: none  DVT prophylaxis: SCDs  HPI/Subjective: Pt has no complaints at this time.  He is mildly confused.  He denies HA, N/V, chest pain, or SOB.  Objective: Blood pressure 104/50, pulse 78, temperature 97.6 F (36.4 C), temperature source Axillary, resp. rate 17, weight 96.3 kg (212 lb 4.9 oz), SpO2 96.00%.  Intake/Output Summary (Last 24 hours) at 03/30/13 1035 Last data filed at 03/30/13 0800  Gross per 24 hour  Intake    790 ml  Output    500 ml  Net    290 ml    Exam: General: No acute respiratory distress Lungs: Clear to auscultation bilaterally without wheezes or crackles Cardiovascular: Regular rate and rhythm without murmur gallop or rub normal S1 and S2 Abdomen: Nontender, nondistended, soft, bowel sounds positive, no rebound, no ascites, no appreciable mass Extremities: No significant cyanosis, clubbing, or edema bilateral lower extremities Neuro:  Alert and oriented - mild confusion - no clear CN deficits   Data  Reviewed: Basic Metabolic Panel:  Recent Labs Lab 03/28/13 1904 03/29/13 0700 03/29/13 1745  NA 137 138 134*  K 4.4 4.1 4.6  CL 102 104 99  CO2 25 22 25   GLUCOSE 241* 268* 471*  BUN 27* 23 29*  CREATININE 0.87 0.75 1.08  CALCIUM 10.7* 10.3 10.1   Liver Function Tests:  Recent Labs Lab 03/28/13 1904 03/29/13 0700  AST 17 23  ALT 19 17  ALKPHOS 96 95  BILITOT 0.3 0.4  PROT 7.0 7.2   ALBUMIN 3.4* 3.4*   CBC:  Recent Labs Lab 03/28/13 1904 03/29/13 0700  WBC 10.7* 6.9  NEUTROABS 8.8* 6.1  HGB 12.0* 12.0*  HCT 34.2* 35.7*  MCV 84.4 84.4  PLT 210 181   Cardiac Enzymes:  Recent Labs Lab 03/28/13 1904 03/29/13 0130 03/29/13 0700 03/29/13 1429  TROPONINI 1.23* 1.68* 1.32* 0.95*   CBG:  Recent Labs Lab 03/28/13 2253 03/29/13 0959 03/29/13 1025 03/29/13 1740 03/30/13 0816  GLUCAP 173* 297* 298* 467* 384*    Recent Results (from the past 240 hour(s))  MRSA PCR SCREENING     Status: None   Collection Time    03/29/13  1:10 AM      Result Value Range Status   MRSA by PCR NEGATIVE  NEGATIVE Final   Comment:            The GeneXpert MRSA Assay (FDA     approved for NASAL specimens     only), is one component of a     comprehensive MRSA colonization     surveillance program. It is not     intended to diagnose MRSA     infection nor to guide or     monitor treatment for     MRSA infections.     Studies:  Recent x-ray studies have been reviewed in detail by the Attending Physician  Scheduled Meds:  Scheduled Meds: . dexamethasone  6 mg Intravenous Q6H  . insulin aspart  0-15 Units Subcutaneous TID WC  . insulin aspart  0-5 Units Subcutaneous QHS  . insulin glargine  70 Units Subcutaneous QHS  . levETIRAcetam  500 mg Intravenous Q12H    Time spent on care of this patient: 35 min   MCCLUNG,JEFFREY T, MD  [facility] Office  [phone] Pager - Text Page per Loretha Stapler as per below:  On-Call/Text Page:      [email or web address]      password [email or web address]  If 7PM-7AM, please contact night-coverage [email or web address] Password [email or web address] 03/30/2013, 10:35 AM   LOS: 2 days

## 2013-03-30 NOTE — Progress Notes (Signed)
PT Cancellation Note  Patient Details Name: George [name] MRN: [id number] DOB: 20-Jan-1946   Cancelled Treatment:    Reason Eval/Treat Not Completed: Patient at procedure or test/unavailable.  Pt currently in MRI.  Will try another time.     Sunny Schlein, [place] [phone] 03/30/2013, 11:15 AM

## 2013-03-31 ENCOUNTER — Encounter (HOSPITAL_COMMUNITY): Payer: Self-pay | Admitting: Radiology

## 2013-03-31 ENCOUNTER — Inpatient Hospital Stay (HOSPITAL_COMMUNITY): Payer: Medicare Other

## 2013-03-31 DIAGNOSIS — C7931 Secondary malignant neoplasm of brain: Principal | ICD-10-CM

## 2013-03-31 DIAGNOSIS — C801 Malignant (primary) neoplasm, unspecified: Secondary | ICD-10-CM

## 2013-03-31 DIAGNOSIS — N2889 Other specified disorders of kidney and ureter: Secondary | ICD-10-CM | POA: Diagnosis present

## 2013-03-31 DIAGNOSIS — C7800 Secondary malignant neoplasm of unspecified lung: Secondary | ICD-10-CM | POA: Diagnosis present

## 2013-03-31 DIAGNOSIS — N289 Disorder of kidney and ureter, unspecified: Secondary | ICD-10-CM

## 2013-03-31 LAB — GLUCOSE, CAPILLARY

## 2013-03-31 MED ORDER — HYDROCODONE-ACETAMINOPHEN 5-325 MG PO TABS
1.0000 | ORAL_TABLET | Freq: Four times a day (QID) | ORAL | Status: DC | PRN
  Administered 2013-03-31: 1 via ORAL
  Administered 2013-03-31 – 2013-04-05 (×12): 2 via ORAL
  Filled 2013-03-31: qty 2
  Filled 2013-03-31: qty 1
  Filled 2013-03-31 (×11): qty 2

## 2013-03-31 MED ORDER — IOHEXOL 300 MG/ML  SOLN
100.0000 mL | Freq: Once | INTRAMUSCULAR | Status: AC | PRN
  Administered 2013-03-31: 100 mL via INTRAVENOUS

## 2013-03-31 MED ORDER — MAGNESIUM HYDROXIDE 400 MG/5ML PO SUSP
30.0000 mL | Freq: Once | ORAL | Status: AC
  Administered 2013-03-31: 30 mL via ORAL
  Filled 2013-03-31: qty 30

## 2013-03-31 NOTE — Consult Note (Signed)
Urology Consult  Referring physician: Rizwan Reason for referral: Widely metastatic renal cell carcinoma  History of Present Illness:  George [name] is a 67 year old male who I have seen on 2 occasions over the last few months for biopsy-proven carcinoma in situ of the penis. The patient was treated with topical therapy utilizing Aldara and on followup appear to have resolution of that problem. The patient was admitted several days ago after presenting with altered mental status and seizures. He was fine to have multiple intracranial lesions consistent with metastatic disease. Additional imaging was performed with CT scan of the chest abdomen and pelvis with contrast. The patient had multiple pulmonary nodules again consistent with metastatic disease. Abdominal CT revealed a very large mass essentially involving the entire right kidney. Tumor was approximately 14 x 12 cm. There appeared to be significant regional lymphadenopathy again consistent with metastatic renal cell carcinoma. Question renal vein involvement. Presumptively the patient has widespread metastatic renal cell carcinoma to the retroperitoneal nodes, lungs and brain. Urology is been consulted for help with management options.Currently he is without significant pain. He has had some mild discomfort in the right flank but nothing that has been significant. He has had no gross hematuria and denies any voiding complaints.  Past Medical History  Diagnosis Date  . Coronary artery disease   . Diabetes mellitus without complication   . Hyperlipidemia    Past Surgical History  Procedure Laterality Date  . Cardiac catheterization      Medications:  Scheduled: . atorvastatin  40 mg Oral q1800  . dexamethasone  6 mg Intravenous Q6H  . insulin aspart  0-20 Units Subcutaneous TID WC  . insulin aspart  0-5 Units Subcutaneous QHS  . insulin glargine  40 Units Subcutaneous BID  . levETIRAcetam  500 mg Oral BID  . omega-3 acid ethyl esters  1 g  Oral BID  . pantoprazole  40 mg Oral Q1200    Allergies:  Allergies  Allergen Reactions  . Metformin And Related Nausea And Vomiting    History reviewed. No pertinent family history.  Social History:  reports that he has been smoking Cigarettes.  He has been smoking about 0.50 packs per day. He does not have any smokeless tobacco history on file. He reports that he does not drink alcohol or use illicit drugs.  Positive for right-sided abdominal and flank discomfort--Mild He has had some mild confusion status post recent events.  Physical Exam:  Vital signs in last 24 hours: Temp:  [97.4 F (36.3 C)-97.9 F (36.6 C)] 97.7 F (36.5 C) (08/28 1611) Pulse Rate:  [52-70] 52 (08/28 0700) Resp:  [13-22] 16 (08/28 1211) BP: (102-134)/(35-75) 112/59 mmHg (08/28 1211) SpO2:  [94 %-100 %] 97 % (08/28 0700)  Constitutional: Vital signs reviewed. WD WN in NAD Head: Normocephalic and atraumatic   Eyes: PERRL, No scleral icterus.  Neck: Supple No  Gross JVD, mass, thyromegaly, or carotid bruit present.  Cardiovascular: RRR Pulmonary/Chest: Normal effort Abdominal: Soft. Non-tender, non-distended, bowel sounds are normal, no masses, organomegaly, or guarding present.  Genitourinary:No concern Extremities: No cyanosis or edema  Neurological: Grossly non-focal.  Skin: Warm,very dry and intact. No rash, cyanosis   Laboratory Data:  Results for orders placed during the hospital encounter of 03/28/13 (from the past 72 hour(s))  CBC WITH DIFFERENTIAL     Status: Abnormal   Collection Time    03/28/13  7:04 PM      Result Value Range   WBC 10.7 (*) 4.0 - 10.5  K/uL   RBC 4.05 (*) 4.22 - 5.81 MIL/uL   Hemoglobin 12.0 (*) 13.0 - 17.0 g/dL   HCT 16.1 (*) 09.6 - 04.5 %   MCV 84.4  78.0 - 100.0 fL   MCH 29.6  26.0 - 34.0 pg   MCHC 35.1  30.0 - 36.0 g/dL   RDW 40.9  81.1 - 91.4 %   Platelets 210  150 - 400 K/uL   Neutrophils Relative % 83 (*) 43 - 77 %   Neutro Abs 8.8 (*) 1.7 - 7.7 K/uL    Lymphocytes Relative 9 (*) 12 - 46 %   Lymphs Abs 0.9  0.7 - 4.0 K/uL   Monocytes Relative 8  3 - 12 %   Monocytes Absolute 0.8  0.1 - 1.0 K/uL   Eosinophils Relative 1  0 - 5 %   Eosinophils Absolute 0.1  0.0 - 0.7 K/uL   Basophils Relative 0  0 - 1 %   Basophils Absolute 0.0  0.0 - 0.1 K/uL  COMPREHENSIVE METABOLIC PANEL     Status: Abnormal   Collection Time    03/28/13  7:04 PM      Result Value Range   Sodium 137  135 - 145 mEq/L   Potassium 4.4  3.5 - 5.1 mEq/L   Chloride 102  96 - 112 mEq/L   CO2 25  19 - 32 mEq/L   Glucose, Bld 241 (*) 70 - 99 mg/dL   BUN 27 (*) 6 - 23 mg/dL   Creatinine, Ser 7.82  0.50 - 1.35 mg/dL   Calcium 95.6 (*) 8.4 - 10.5 mg/dL   Total Protein 7.0  6.0 - 8.3 g/dL   Albumin 3.4 (*) 3.5 - 5.2 g/dL   AST 17  0 - 37 U/L   ALT 19  0 - 53 U/L   Alkaline Phosphatase 96  39 - 117 U/L   Total Bilirubin 0.3  0.3 - 1.2 mg/dL   GFR calc non Af Amer 88 (*) >90 mL/min   GFR calc Af Amer >90  >90 mL/min   Comment: (NOTE)     The eGFR has been calculated using the CKD EPI equation.     This calculation has not been validated in all clinical situations.     eGFR's persistently <90 mL/min signify possible Chronic Kidney     Disease.  TROPONIN I     Status: Abnormal   Collection Time    03/28/13  7:04 PM      Result Value Range   Troponin I 1.23 (*) <0.30 ng/mL   Comment:            Due to the release kinetics of cTnI,     a negative result within the first hours     of the onset of symptoms does not rule out     myocardial infarction with certainty.     If myocardial infarction is still suspected,     repeat the test at appropriate intervals.     CRITICAL RESULT CALLED TO, READ BACK BY AND VERIFIED WITH:     A MCKEWON,RN [id number] 2019 WILDERK  GLUCOSE, CAPILLARY     Status: Abnormal   Collection Time    03/28/13 10:53 PM      Result Value Range   Glucose-Capillary 173 (*) 70 - 99 mg/dL  MRSA PCR SCREENING     Status: None   Collection Time     03/29/13  1:10 AM  Result Value Range   MRSA by PCR NEGATIVE  NEGATIVE   Comment:            The GeneXpert MRSA Assay (FDA     approved for NASAL specimens     only), is one component of a     comprehensive MRSA colonization     surveillance program. It is not     intended to diagnose MRSA     infection nor to guide or     monitor treatment for     MRSA infections.  TROPONIN I     Status: Abnormal   Collection Time    03/29/13  1:30 AM      Result Value Range   Troponin I 1.68 (*) <0.30 ng/mL   Comment:            Due to the release kinetics of cTnI,     a negative result within the first hours     of the onset of symptoms does not rule out     myocardial infarction with certainty.     If myocardial infarction is still suspected,     repeat the test at appropriate intervals.     REPEATED TO VERIFY     CRITICAL VALUE NOTED.  VALUE IS CONSISTENT WITH PREVIOUSLY REPORTED AND CALLED VALUE.  COMPREHENSIVE METABOLIC PANEL     Status: Abnormal   Collection Time    03/29/13  7:00 AM      Result Value Range   Sodium 138  135 - 145 mEq/L   Potassium 4.1  3.5 - 5.1 mEq/L   Chloride 104  96 - 112 mEq/L   CO2 22  19 - 32 mEq/L   Glucose, Bld 268 (*) 70 - 99 mg/dL   BUN 23  6 - 23 mg/dL   Creatinine, Ser 0.45  0.50 - 1.35 mg/dL   Calcium 40.9  8.4 - 81.1 mg/dL   Total Protein 7.2  6.0 - 8.3 g/dL   Albumin 3.4 (*) 3.5 - 5.2 g/dL   AST 23  0 - 37 U/L   ALT 17  0 - 53 U/L   Alkaline Phosphatase 95  39 - 117 U/L   Total Bilirubin 0.4  0.3 - 1.2 mg/dL   GFR calc non Af Amer >90  >90 mL/min   GFR calc Af Amer >90  >90 mL/min   Comment: (NOTE)     The eGFR has been calculated using the CKD EPI equation.     This calculation has not been validated in all clinical situations.     eGFR's persistently <90 mL/min signify possible Chronic Kidney     Disease.  CBC WITH DIFFERENTIAL     Status: Abnormal   Collection Time    03/29/13  7:00 AM      Result Value Range   WBC 6.9  4.0 -  10.5 K/uL   RBC 4.23  4.22 - 5.81 MIL/uL   Hemoglobin 12.0 (*) 13.0 - 17.0 g/dL   HCT 91.4 (*) 78.2 - 95.6 %   MCV 84.4  78.0 - 100.0 fL   MCH 28.4  26.0 - 34.0 pg   MCHC 33.6  30.0 - 36.0 g/dL   RDW 21.3  08.6 - 57.8 %   Platelets 181  150 - 400 K/uL   Neutrophils Relative % 89 (*) 43 - 77 %   Neutro Abs 6.1  1.7 - 7.7 K/uL   Lymphocytes Relative 10 (*) 12 - 46 %  Lymphs Abs 0.7  0.7 - 4.0 K/uL   Monocytes Relative 1 (*) 3 - 12 %   Monocytes Absolute 0.1  0.1 - 1.0 K/uL   Eosinophils Relative 0  0 - 5 %   Eosinophils Absolute 0.0  0.0 - 0.7 K/uL   Basophils Relative 0  0 - 1 %   Basophils Absolute 0.0  0.0 - 0.1 K/uL  HEMOGLOBIN A1C     Status: Abnormal   Collection Time    03/29/13  7:00 AM      Result Value Range   Hemoglobin A1C 7.4 (*) <5.7 %   Comment: (NOTE)                                                                               According to the ADA Clinical Practice Recommendations for 2011, when     HbA1c is used as a screening test:      >=6.5%   Diagnostic of Diabetes Mellitus               (if abnormal result is confirmed)     5.7-6.4%   Increased risk of developing Diabetes Mellitus     References:Diagnosis and Classification of Diabetes Mellitus,Diabetes     Care,2011,34(Suppl 1):S62-S69 and Standards of Medical Care in             Diabetes - 2011,Diabetes Care,2011,34 (Suppl 1):S11-S61.   Mean Plasma Glucose 166 (*) <117 mg/dL   Comment: Performed at [facility]  TROPONIN I     Status: Abnormal   Collection Time    03/29/13  7:00 AM      Result Value Range   Troponin I 1.32 (*) <0.30 ng/mL   Comment:            Due to the release kinetics of cTnI,     a negative result within the first hours     of the onset of symptoms does not rule out     myocardial infarction with certainty.     If myocardial infarction is still suspected,     repeat the test at appropriate intervals.     REPEATED TO VERIFY     CRITICAL VALUE NOTED.  VALUE IS  CONSISTENT WITH PREVIOUSLY REPORTED AND CALLED VALUE.  LIPID PANEL     Status: Abnormal   Collection Time    03/29/13  7:00 AM      Result Value Range   Cholesterol 129  0 - 200 mg/dL   Triglycerides 46  <161 mg/dL   HDL 35 (*) >09 mg/dL   Total CHOL/HDL Ratio 3.7     VLDL 9  0 - 40 mg/dL   LDL Cholesterol 85  0 - 99 mg/dL   Comment:            Total Cholesterol/HDL:CHD Risk     Coronary Heart Disease Risk Table                         Men   Women      1/2 Average Risk   3.4   3.3      Average Risk  5.0   4.4      2 X Average Risk   9.6   7.1      3 X Average Risk  23.4   11.0                Use the calculated Patient Ratio     above and the CHD Risk Table     to determine the patient's CHD Risk.                ATP III CLASSIFICATION (LDL):      <100     mg/dL   Optimal      161-096  mg/dL   Near or Above                        Optimal      130-159  mg/dL   Borderline      045-409  mg/dL   High      >811     mg/dL   Very High  PARATHYROID HORMONE, INTACT (NO CA)     Status: Abnormal   Collection Time    03/29/13  7:00 AM      Result Value Range   PTH 3.6 (*) 14.0 - 72.0 pg/mL   Comment: Performed at [facility]  VITAMIN D 25 HYDROXY     Status: Abnormal   Collection Time    03/29/13  7:00 AM      Result Value Range   Vit D, 25-Hydroxy 23 (*) 30 - 89 ng/mL   Comment: (NOTE)     This assay accurately quantifies Vitamin D, which is the sum of the     25-Hydroxy forms of Vitamin D2 and D3.  Studies have shown that the     optimum concentration of 25-Hydroxy Vitamin D is 30 ng/mL or higher.      Concentrations of Vitamin D between 20 and 29 ng/mL are considered to     be insufficient and concentrations less than 20 ng/mL are considered     to be deficient for Vitamin D.     Performed at [facility]  GLUCOSE, CAPILLARY     Status: Abnormal   Collection Time    03/29/13  9:59 AM      Result Value Range   Glucose-Capillary 297 (*) 70 - 99 mg/dL    Comment 1 Documented in Chart     Comment 2 Notify RN    GLUCOSE, CAPILLARY     Status: Abnormal   Collection Time    03/29/13 10:25 AM      Result Value Range   Glucose-Capillary 298 (*) 70 - 99 mg/dL  TROPONIN I     Status: Abnormal   Collection Time    03/29/13  2:29 PM      Result Value Range   Troponin I 0.95 (*) <0.30 ng/mL   Comment:            Due to the release kinetics of cTnI,     a negative result within the first hours     of the onset of symptoms does not rule out     myocardial infarction with certainty.     If myocardial infarction is still suspected,     repeat the test at appropriate intervals.     REPEATED TO VERIFY     CRITICAL VALUE NOTED.  VALUE IS CONSISTENT WITH PREVIOUSLY REPORTED AND CALLED VALUE.  GLUCOSE, CAPILLARY  Status: Abnormal   Collection Time    03/29/13  5:40 PM      Result Value Range   Glucose-Capillary 467 (*) 70 - 99 mg/dL  BASIC METABOLIC PANEL     Status: Abnormal   Collection Time    03/29/13  5:45 PM      Result Value Range   Sodium 134 (*) 135 - 145 mEq/L   Potassium 4.6  3.5 - 5.1 mEq/L   Chloride 99  96 - 112 mEq/L   CO2 25  19 - 32 mEq/L   Glucose, Bld 471 (*) 70 - 99 mg/dL   BUN 29 (*) 6 - 23 mg/dL   Creatinine, Ser 8.29  0.50 - 1.35 mg/dL   Calcium 56.2  8.4 - 13.0 mg/dL   GFR calc non Af Amer 70 (*) >90 mL/min   GFR calc Af Amer 81 (*) >90 mL/min   Comment: (NOTE)     The eGFR has been calculated using the CKD EPI equation.     This calculation has not been validated in all clinical situations.     eGFR's persistently <90 mL/min signify possible Chronic Kidney     Disease.  GLUCOSE, CAPILLARY     Status: Abnormal   Collection Time    03/29/13  9:50 PM      Result Value Range   Glucose-Capillary 471 (*) 70 - 99 mg/dL   Comment 1 Notify RN     Comment 2 Documented in Chart    GLUCOSE, CAPILLARY     Status: Abnormal   Collection Time    03/30/13  8:16 AM      Result Value Range   Glucose-Capillary 384 (*) 70 -  99 mg/dL   Comment 1 Notify RN     Comment 2 Documented in Chart    GLUCOSE, CAPILLARY     Status: Abnormal   Collection Time    03/30/13 12:34 PM      Result Value Range   Glucose-Capillary 327 (*) 70 - 99 mg/dL   Comment 1 Documented in Chart     Comment 2 Notify RN    GLUCOSE, CAPILLARY     Status: Abnormal   Collection Time    03/30/13  7:00 PM      Result Value Range   Glucose-Capillary 388 (*) 70 - 99 mg/dL  GLUCOSE, CAPILLARY     Status: Abnormal   Collection Time    03/30/13  9:55 PM      Result Value Range   Glucose-Capillary 389 (*) 70 - 99 mg/dL   Comment 1 Notify RN     Comment 2 Documented in Chart    GLUCOSE, CAPILLARY     Status: Abnormal   Collection Time    03/31/13  8:27 AM      Result Value Range   Glucose-Capillary 248 (*) 70 - 99 mg/dL   Comment 1 Documented in Chart     Comment 2 Notify RN    GLUCOSE, CAPILLARY     Status: Abnormal   Collection Time    03/31/13 12:09 PM      Result Value Range   Glucose-Capillary 337 (*) 70 - 99 mg/dL   Comment 1 Documented in Chart     Comment 2 Notify RN     Recent Results (from the past 240 hour(s))  MRSA PCR SCREENING     Status: None   Collection Time    03/29/13  1:10 AM      Result Value  Range Status   MRSA by PCR NEGATIVE  NEGATIVE Final   Comment:            The GeneXpert MRSA Assay (FDA     approved for NASAL specimens     only), is one component of a     comprehensive MRSA colonization     surveillance program. It is not     intended to diagnose MRSA     infection nor to guide or     monitor treatment for     MRSA infections.   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 A tissue diagnosis has not yet been established but the patient undoubtedly  does have a renal cell carcinoma primary with widespread metastatic  disease. Most ominous is the brain metastases. There is certainly a role for cytoreductive nephrectomy as well as palliative nephrectomy in the proper setting. Generally cytoreductive nephrectomy is not done in the face of widespread metastatic disease especially with brain metastasis. Certainly there are situations however where a palliative nephrectomy can be offered to control severe pain or ongoing severe hematuria. Currently despite the very large right renal mass he is relatively asymptomatic. There is no indication currently for a nephrectomy. While this patient undoubtedly has metastatic renal cell carcinoma tissue diagnosis does need to be established.This patient needs medical oncology consultation. He will require initiation of systemic therapy.Is also critical that the patient get an established plan for dealing with his intracranial metastatic disease. Radiation oncology should provide consultation for their input on treatment options. Tissue diagnosis can be established by interventional radiology by biopsying the most accessible metastatic disease.  Plan:  As above. No indication at this time for nephrectomy. The patient needs consultation from radiation oncology as well as medical oncology. The medical oncologist to deals with most genitourinary malignancies is Dr. Clelia Croft. Interventional radiology should be involved in getting their input as to the most accessible disease to go after with biopsy. Urologic services can certainly be available to help but at this point there is really no active role for our involvement at this time.  George [name] S 03/31/2013, 6:19 PM

## 2013-03-31 NOTE — Progress Notes (Signed)
Occupational Therapy Treatment Patient Details Name: George [name] MRN: [id number] DOB: 03-12-1946 Today's Date: 03/31/2013 Time: 1040-1104 OT Time Calculation (min): 24 min  OT Assessment / Plan / Recommendation  History of present illness Pt admitted after being found down of floor by wife.  Pt with seizure activity in ER.  Scan showed CVA vs mass.  MRI with contrast scheduled.   OT comments  Pt with increased safety awareness this OT visit  Follow Up Recommendations  Outpatient OT;Home health OT;Supervision/Assistance - 24 hour       Equipment Recommendations  None recommended by OT          Progress towards OT Goals Progress towards OT goals: Progressing toward goals  Plan Discharge plan remains appropriate    Precautions / Restrictions Precautions Precautions: Fall       ADL  Toilet Transfer: Performed;Supervision/safety Toilet Transfer Method: Sit to stand;Other (comment) (ambulating to bathroom) Toilet Transfer Equipment: Comfort height toilet Toileting - Clothing Manipulation and Hygiene: Performed;Supervision/safety Where Assessed - Toileting Clothing Manipulation and Hygiene: Standing Transfers/Ambulation Related to ADLs: Pt did walk in room with OT and around unit.  Pt did well and did not demonstrate any impulsive behavior.  Wife present.  Pt and wife report Short term memory loss , as well as wording finding trouble.  Pt did well with this during OT session      OT Goals(current goals can now be found in the care plan section)    Visit Information  Last OT Received On: 03/31/13 History of Present Illness: Pt admitted after being found down of floor by wife.  Pt with seizure activity in ER.  Scan showed CVA vs mass.  MRI with contrast scheduled.          Cognition  Cognition Arousal/Alertness: Awake/alert Behavior During Therapy: WFL for tasks assessed/performed Overall Cognitive Status: Within Functional Limits for tasks assessed General Comments:  increased safety awareness this OT visit    Mobility  Bed Mobility Bed Mobility: Not assessed Transfers Sit to Stand: 6: Modified independent (Device/Increase time);With upper extremity assist;From chair/3-in-1 Stand to Sit: 6: Modified independent (Device/Increase time);With upper extremity assist;To chair/3-in-1 Details for Transfer Assistance: Much more steady today than for OT eval yesterday.      Exercises      Balance Balance Balance Assessed: Yes Static Standing Balance Static Standing - Balance Support: No upper extremity supported Static Standing - Level of Assistance: 6: Modified independent (Device/Increase time)   End of Session OT - End of Session Activity Tolerance: Patient tolerated treatment well Patient left: in chair;with call bell/phone within reach;with family/visitor present  GO     Araly Kaas, [place] Kung 03/31/2013, 11:09 AM

## 2013-03-31 NOTE — Progress Notes (Signed)
SUBJECTIVE:  Patient very tearful this am after learning results of MRI  OBJECTIVE:   Vitals:   Filed Vitals:   03/31/13 0013 03/31/13 0400 03/31/13 0416 03/31/13 0700  BP: 107/52  102/35   Pulse: 53 59 70   Temp: 97.8 F (36.6 C)  97.4 F (36.3 C) 97.9 F (36.6 C)  TempSrc: Oral  Oral Oral  Resp: 22 17 17    Height:      Weight:      SpO2: 94% 96% 98%    I&O's:   Intake/Output Summary (Last 24 hours) at 03/31/13 0948 Last data filed at 03/31/13 0700  Gross per 24 hour  Intake  822.5 ml  Output   1785 ml  Net -962.5 ml   TELEMETRY: Reviewed telemetry pt in NSR:     PHYSICAL EXAM General: Well developed, well nourished, in no acute distress Head: Eyes PERRLA, No xanthomas.   Normal cephalic and atramatic  Lungs:   Clear bilaterally to auscultation and percussion. Heart:   HRRR S1 S2 Pulses are 2+ & equal. Abdomen: Bowel sounds are positive, abdomen soft and non-tender without masses  Extremities:   No clubbing, cyanosis or edema.  DP +1 Neuro: Alert and oriented X 3. Psych:  Good affect, responds appropriately   LABS: Basic Metabolic Panel:  Recent Labs  16/10/96 0700 03/29/13 1745  NA 138 134*  K 4.1 4.6  CL 104 99  CO2 22 25  GLUCOSE 268* 471*  BUN 23 29*  CREATININE 0.75 1.08  CALCIUM 10.3 10.1   Liver Function Tests:  Recent Labs  03/28/13 1904 03/29/13 0700  AST 17 23  ALT 19 17  ALKPHOS 96 95  BILITOT 0.3 0.4  PROT 7.0 7.2  ALBUMIN 3.4* 3.4*   No results found for this basename: LIPASE, AMYLASE,  in the last 72 hours CBC:  Recent Labs  03/28/13 1904 03/29/13 0700  WBC 10.7* 6.9  NEUTROABS 8.8* 6.1  HGB 12.0* 12.0*  HCT 34.2* 35.7*  MCV 84.4 84.4  PLT 210 181   Cardiac Enzymes:  Recent Labs  03/29/13 0130 03/29/13 0700 03/29/13 1429  TROPONINI 1.68* 1.32* 0.95*   BNP: No components found with this basename: POCBNP,  D-Dimer: No results found for this basename: DDIMER,  in the last 72 hours Hemoglobin A1C:  Recent  Labs  03/29/13 0700  HGBA1C 7.4*   Fasting Lipid Panel:  Recent Labs  03/29/13 0700  CHOL 129  HDL 35*  LDLCALC 85  TRIG 46  CHOLHDL 3.7   Thyroid Function Tests: No results found for this basename: TSH, T4TOTAL, FREET3, T3FREE, THYROIDAB,  in the last 72 hours Anemia Panel: No results found for this basename: VITAMINB12, FOLATE, FERRITIN, TIBC, IRON, RETICCTPCT,  in the last 72 hours Coag Panel:   No results found for this basename: INR, PROTIME    RADIOLOGY: Dg Chest 2 View  03/28/2013   *RADIOLOGY REPORT*  Clinical Data: Altered mental status, fall  CHEST - 2 VIEW  Comparison: None.  Findings: Cardiac leads overlie the chest.  Heart size is normal. Lung volumes are low but clear.  No pleural effusion.  No acute osseous finding. Mild central bronchial wall thickening noted.  IMPRESSION: No focal pulmonary opacity.  Mild central bronchial wall thickening which may indicate reactive airways disease or bronchitis.   Original Report Authenticated By: Christiana Pellant, M.D.   Ct Head Wo Contrast  03/28/2013   *RADIOLOGY REPORT*  Clinical Data:  Altered mental status and fall  CT HEAD  WITHOUT CONTRAST CT CERVICAL SPINE WITHOUT CONTRAST  Technique:  Multidetector CT imaging of the head and cervical spine was performed following the standard protocol without intravenous contrast.  Multiplanar CT image reconstructions of the cervical spine were also generated.  Comparison:  CT 03/26/1999 is not digitized.  Report is reviewed.  CT HEAD  Findings: There is a large area of abnormal hypo attenuation within the gray matter and extending to the cortical surface in the left frontoparietal lobe region.  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  No acute hemorrhage is seen.  No sulcal effacement.  No midline shift.  No skull fracture. Orbits and paranasal sinuses are intact.  IMPRESSION: Abnormal area of predominately white matter left frontoparietal lobe hypodensity,  which may indicate acute versus subacute infarct. Infiltrative neoplasm such as glioblastoma multiforme is possible but considered less likely given absence of focal effacement.  No acute hemorrhage.  CT CERVICAL SPINE  Findings: C1 through the cervical thoracic junction is visualized in its entirety. No precervical soft tissue widening is present. No fracture or dislocation.  Alignment is normal.  IMPRESSION: No acute cervical spine abnormality.  Critical Value/emergent results were called by telephone at the time of interpretation on 03/28/2013 at 8:25 p.m. to Dr. Regenia Skeeter, who verbally acknowledged these results.   Original Report Authenticated By: Conchita Paris, M.D.   Ct Chest W Contrast  03/31/2013   *RADIOLOGY REPORT*  Clinical Data:  Metastasis to the brain.  History of smoking. Evaluate for lung cancer.  CT CHEST, ABDOMEN AND PELVIS WITH CONTRAST  Technique: Contiguous axial images of the chest abdomen and pelvis were obtained after IV contrast administration.  Contrast: 100  ml Omnipaque-300  Comparison: Plain film chest 03/28/2013.  No prior CTs.  CT CHEST  Findings: Lung windows demonstrate probable secretions within the trachea.  Mild centrilobular emphysema.  Multiple bilateral pulmonary nodules, most consistent with metastatic disease.  Index right upper lobe lung nodule measures 1.2 cm on image 17/series 3. Index right lower lobe lung nodule measures 2.1 cm on image 34/series 3. Index left upper lobe nodule measures 1.1 cm on image 12/series 3.  Soft tissue windows demonstrate no supraclavicular adenopathy. Aortic and coronary artery atherosclerosis.  Heart size upper normal, without pericardial effusion.  No pleural fluid. No central pulmonary embolism, on this non-dedicated study.  Left paratracheal 1.7 cm node on image 19/series 2. 1.6 cm subcarinal node on image 26/series 2. Bovine arch.  Small hiatal hernia.  IMPRESSION:  1.  Extensive pulmonary metastasis. 2.  Mediastinal adenopathy,  suspicious for nodal metastasis. 3.  Atherosclerosis, including within the coronary arteries. 4.  Small hiatal hernia.  CT ABDOMEN AND PELVIS  Findings:  Normal liver, spleen, distal stomach, pancreas, gallbladder, biliary tract, adrenal glands, left kidney.  Indeterminate upper pole right renal lesion of 1.9 cm.  Infiltrative irregular mass arising from the interpolar right kidney which measures 14.1 x 12.2 cm on image 76/series 2.  12.0 cm on coronal image 49.  Tumor extends towards and may involve the right renal vein, including on image 67/series 2.  There is extensive neovascularity.  Aortic atherosclerosis.  11 mm left periaortic node on image 74.  Normal colon and terminal ileum.  Normal small bowel without abdominal ascites.    No pelvic adenopathy.  Normal urinary bladder.  Mild prostatomegaly.  Probable bone islands in the right pelvis.  Minimal wedging of the superior endplate of L1, without canal compromise.  Disc bulge at L4-L5.  IMPRESSION:  1.  Infiltrative right renal mass, most consistent with renal cell carcinoma.  Extension towards and probable involvement of the right renal vein. 2.  Retroperitoneal adenopathy, suspicious for nodal metastasis. 3.  Indeterminate upper pole right renal lesion, of doubtful clinical significance. 4.  Prostatomegaly.   Original Report Authenticated By: Abigail Miyamoto, M.D.   Ct Cervical Spine Wo Contrast  03/28/2013   *RADIOLOGY REPORT*  Clinical Data:  Altered mental status and fall  CT HEAD WITHOUT CONTRAST CT CERVICAL SPINE WITHOUT CONTRAST  Technique:  Multidetector CT imaging of the head and cervical spine was performed following the standard protocol without intravenous contrast.  Multiplanar CT image reconstructions of the cervical spine were also generated.  Comparison:  CT 03/26/1999 is not digitized.  Report is reviewed.  CT HEAD  Findings: There is a large area of abnormal hypo attenuation within the gray matter and extending to the cortical surface in the  left frontoparietal lobe region.  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  No acute hemorrhage is seen.  No sulcal effacement.  No midline shift.  No skull fracture. Orbits and paranasal sinuses are intact.  IMPRESSION: Abnormal area of predominately white matter left frontoparietal lobe hypodensity, which may indicate acute versus subacute infarct. Infiltrative neoplasm such as glioblastoma multiforme is possible but considered less likely given absence of focal effacement.  No acute hemorrhage.  CT CERVICAL SPINE  Findings: C1 through the cervical thoracic junction is visualized in its entirety. No precervical soft tissue widening is present. No fracture or dislocation.  Alignment is normal.  IMPRESSION: No acute cervical spine abnormality.  Critical Value/emergent results were called by telephone at the time of interpretation on 03/28/2013 at 8:25 p.m. to Dr. Regenia Skeeter, who verbally acknowledged these results.   Original Report Authenticated By: Conchita Paris, M.D.   Mr Jeri Cos Wo Contrast  03/30/2013   *RADIOLOGY REPORT*  Clinical Data: Seizure  MRI HEAD WITHOUT AND WITH CONTRAST  Technique:  Multiplanar, multiecho pulse sequences of the brain and surrounding structures were obtained according to standard protocol without and with intravenous contrast  Contrast: 27mL MULTIHANCE GADOBENATE DIMEGLUMINE 529 MG/ML IV SOLN  Comparison: CT 03/28/2013  Findings: Multiple enhancing mass lesions are present compatible with metastatic disease.  The largest lesion in the left frontal lobe measures 16 x 16 mm and has a large amount of surrounding white matter edema. Minimal associated hemorrhage.  4 mm enhancing lesion is seen posterior to this lesion.  8 x 17 mm periventricular lesion on the left. 5 mm enhancing lesion right parietal lobe with mild edema 8 x 12 mm left cerebellar lesion. Mild cerebellar edema on the left. 5 mm left cerebellar lesion 6 x 10 mm left posterior inferior  cerebellar lesion.  No significant mass effect on the fourth ventricle is present.  Ventricle size is normal.  Mild atrophy is present.  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  2 mm midline shift to the right.  Negative for acute infarct.  There is a small amount of restricted diffusion in the left periventricular white matter lesion and a small amount of restricted diffusion in the larger left frontal lesion.  IMPRESSION: Multiple enhancing lesions in the brain compatible with metastatic disease.  There is a large amount of white matter edema in the left frontal lobe with 3 enhancing masses in the left frontal lobe. Mild midline shift 2 mm.   Original Report Authenticated By: Carl Best, M.D.   Ct Abdomen Pelvis  W Contrast  03/31/2013   *RADIOLOGY REPORT*  Clinical Data:  Metastasis to the brain.  History of smoking. Evaluate for lung cancer.  CT CHEST, ABDOMEN AND PELVIS WITH CONTRAST  Technique: Contiguous axial images of the chest abdomen and pelvis were obtained after IV contrast administration.  Contrast: 100  ml Omnipaque-300  Comparison: Plain film chest 03/28/2013.  No prior CTs.  CT CHEST  Findings: Lung windows demonstrate probable secretions within the trachea.  Mild centrilobular emphysema.  Multiple bilateral pulmonary nodules, most consistent with metastatic disease.  Index right upper lobe lung nodule measures 1.2 cm on image 17/series 3. Index right lower lobe lung nodule measures 2.1 cm on image 34/series 3. Index left upper lobe nodule measures 1.1 cm on image 12/series 3.  Soft tissue windows demonstrate no supraclavicular adenopathy. Aortic and coronary artery atherosclerosis.  Heart size upper normal, without pericardial effusion.  No pleural fluid. No central pulmonary embolism, on this non-dedicated study.  Left paratracheal 1.7 cm node on image 19/series 2. 1.6 cm subcarinal node on image 26/series 2. Bovine arch.  Small hiatal hernia.  IMPRESSION:  1.   Extensive pulmonary metastasis. 2.  Mediastinal adenopathy, suspicious for nodal metastasis. 3.  Atherosclerosis, including within the coronary arteries. 4.  Small hiatal hernia.  CT ABDOMEN AND PELVIS  Findings:  Normal liver, spleen, distal stomach, pancreas, gallbladder, biliary tract, adrenal glands, left kidney.  Indeterminate upper pole right renal lesion of 1.9 cm.  Infiltrative irregular mass arising from the interpolar right kidney which measures 14.1 x 12.2 cm on image 76/series 2.  12.0 cm on coronal image 49.  Tumor extends towards and may involve the right renal vein, including on image 67/series 2.  There is extensive neovascularity.  Aortic atherosclerosis.  11 mm left periaortic node on image 74.  Normal colon and terminal ileum.  Normal small bowel without abdominal ascites.    No pelvic adenopathy.  Normal urinary bladder.  Mild prostatomegaly.  Probable bone islands in the right pelvis.  Minimal wedging of the superior endplate of L1, without canal compromise.  Disc bulge at L4-L5.  IMPRESSION:  1.  Infiltrative right renal mass, most consistent with renal cell carcinoma.  Extension towards and probable involvement of the right renal vein. 2.  Retroperitoneal adenopathy, suspicious for nodal metastasis. 3.  Indeterminate upper pole right renal lesion, of doubtful clinical significance. 4.  Prostatomegaly.   Original Report Authenticated By: Abigail Miyamoto, M.D.   Impression:  Nonspecific troponin elevation -possibly related to underlying seizure Seizure  Multiple brain lesions by MRI with edema c/w metastatic disease No clear signs or reported symptoms to suggestive acute coronary syndrome.  Recommendations:  -Patient had Chest and abdominal CT this am to find source of metastatic disease - Monitor on telemetry, ecg prn for rhythm change or symptoms  - Trend troponins  - Given findings on MRI with edema would defer cath at this time        Sueanne Margarita, MD  03/31/2013  9:48 AM

## 2013-03-31 NOTE — Progress Notes (Addendum)
Subjective: Patient without further seizures.  Results of MRI discussed.  Films shared with patient and wife.    Objective: Current vital signs: BP 102/35  Pulse 70  Temp(Src) 97.9 F (36.6 C) (Oral)  Resp 17  Ht 6\' 2"  (1.88 m)  Wt 96.3 kg (212 lb 4.9 oz)  BMI 27.25 kg/m2  SpO2 98% Vital signs in last 24 hours: Temp:  [97.4 F (36.3 C)-97.9 F (36.6 C)] 97.9 F (36.6 C) (08/28 0700) Pulse Rate:  [53-70] 70 (08/28 0416) Resp:  [13-22] 17 (08/28 0416) BP: (102-134)/(35-52) 102/35 mmHg (08/28 0416) SpO2:  [94 %-100 %] 98 % (08/28 0416)  Intake/Output from previous day: 08/27 0701 - 08/28 0700 In: 922.5 [P.O.:240; I.V.:682.5] Out: 1785 [Urine:1785] Intake/Output this shift:   Nutritional status: Carb Control  Neurologic Exam: Mental Status:  Alert, oriented, thought content appropriate. Speech fluent without evidence of aphasia. Able to follow 3 step commands without difficulty.  Cranial Nerves:  II: Discs flat bilaterally; Visual fields grossly normal, pupils equal, round, reactive to light and accommodation  III,IV, VI: ptosis not present, extra-ocular motions intact bilaterally  V,VII: decrease in right NLF, facial light touch sensation normal bilaterally  VIII: hearing normal bilaterally  IX,X: gag reflex present  XI: bilateral shoulder shrug  XII: midline tongue extension  Motor:  Right : Upper extremity 5/5          Left: Upper extremity 5/5   Lower extremity 5/5       Lower extremity 5/5  Tone and bulk:normal tone throughout; no atrophy noted  Sensory: Pinprick and light touch intact throughout, bilaterally  Deep Tendon Reflexes: 2+ and symmetric throughout  Plantars:  Right: downgoing     Left: downgoing   Lab Results: Basic Metabolic Panel:  Recent Labs Lab 03/28/13 1904 03/29/13 0700 03/29/13 1745  NA 137 138 134*  K 4.4 4.1 4.6  CL 102 104 99  CO2 25 22 25   GLUCOSE 241* 268* 471*  BUN 27* 23 29*  CREATININE 0.87 0.75 1.08  CALCIUM 10.7* 10.3  10.1    Liver Function Tests:  Recent Labs Lab 03/28/13 1904 03/29/13 0700  AST 17 23  ALT 19 17  ALKPHOS 96 95  BILITOT 0.3 0.4  PROT 7.0 7.2  ALBUMIN 3.4* 3.4*   No results found for this basename: LIPASE, AMYLASE,  in the last 168 hours No results found for this basename: AMMONIA,  in the last 168 hours  CBC:  Recent Labs Lab 03/28/13 1904 03/29/13 0700  WBC 10.7* 6.9  NEUTROABS 8.8* 6.1  HGB 12.0* 12.0*  HCT 34.2* 35.7*  MCV 84.4 84.4  PLT 210 181    Cardiac Enzymes:  Recent Labs Lab 03/28/13 1904 03/29/13 0130 03/29/13 0700 03/29/13 1429  TROPONINI 1.23* 1.68* 1.32* 0.95*    Lipid Panel:  Recent Labs Lab 03/29/13 0700  CHOL 129  TRIG 46  HDL 35*  CHOLHDL 3.7  VLDL 9  LDLCALC 85    CBG:  Recent Labs Lab 03/30/13 1234 03/30/13 1900 03/30/13 2155 03/31/13 0827 03/31/13 1209  GLUCAP 327* 388* 389* 248* 337*    Microbiology: Results for orders placed during the hospital encounter of 03/28/13  MRSA PCR SCREENING     Status: None   Collection Time    03/29/13  1:10 AM      Result Value Range Status   MRSA by PCR NEGATIVE  NEGATIVE Final   Comment:            The GeneXpert MRSA  Assay (FDA     approved for NASAL specimens     only), is one component of a     comprehensive MRSA colonization     surveillance program. It is not     intended to diagnose MRSA     infection nor to guide or     monitor treatment for     MRSA infections.    Coagulation Studies: No results found for this basename: LABPROT, INR,  in the last 72 hours  Imaging: Ct Chest W Contrast  03/31/2013   *RADIOLOGY REPORT*  Clinical Data:  Metastasis to the brain.  History of smoking. Evaluate for lung cancer.  CT CHEST, ABDOMEN AND PELVIS WITH CONTRAST  Technique: Contiguous axial images of the chest abdomen and pelvis were obtained after IV contrast administration.  Contrast: 100  ml Omnipaque-300  Comparison: Plain film chest 03/28/2013.  No prior CTs.  CT CHEST   Findings: Lung windows demonstrate probable secretions within the trachea.  Mild centrilobular emphysema.  Multiple bilateral pulmonary nodules, most consistent with metastatic disease.  Index right upper lobe lung nodule measures 1.2 cm on image 17/series 3. Index right lower lobe lung nodule measures 2.1 cm on image 34/series 3. Index left upper lobe nodule measures 1.1 cm on image 12/series 3.  Soft tissue windows demonstrate no supraclavicular adenopathy. Aortic and coronary artery atherosclerosis.  Heart size upper normal, without pericardial effusion.  No pleural fluid. No central pulmonary embolism, on this non-dedicated study.  Left paratracheal 1.7 cm node on image 19/series 2. 1.6 cm subcarinal node on image 26/series 2. Bovine arch.  Small hiatal hernia.  IMPRESSION:  1.  Extensive pulmonary metastasis. 2.  Mediastinal adenopathy, suspicious for nodal metastasis. 3.  Atherosclerosis, including within the coronary arteries. 4.  Small hiatal hernia.  CT ABDOMEN AND PELVIS  Findings:  Normal liver, spleen, distal stomach, pancreas, gallbladder, biliary tract, adrenal glands, left kidney.  Indeterminate upper pole right renal lesion of 1.9 cm.  Infiltrative irregular mass arising from the interpolar right kidney which measures 14.1 x 12.2 cm on image 76/series 2.  12.0 cm on coronal image 49.  Tumor extends towards and may involve the right renal vein, including on image 67/series 2.  There is extensive neovascularity.  Aortic atherosclerosis.  11 mm left periaortic node on image 74.  Normal colon and terminal ileum.  Normal small bowel without abdominal ascites.    No pelvic adenopathy.  Normal urinary bladder.  Mild prostatomegaly.  Probable bone islands in the right pelvis.  Minimal wedging of the superior endplate of L1, without canal compromise.  Disc bulge at L4-L5.  IMPRESSION:  1.  Infiltrative right renal mass, most consistent with renal cell carcinoma.  Extension towards and probable involvement of  the right renal vein. 2.  Retroperitoneal adenopathy, suspicious for nodal metastasis. 3.  Indeterminate upper pole right renal lesion, of doubtful clinical significance. 4.  Prostatomegaly.   Original Report Authenticated By: Jeronimo Greaves, M.D.   Mr Laqueta Jean Wo Contrast  03/30/2013   *RADIOLOGY REPORT*  Clinical Data: Seizure  MRI HEAD WITHOUT AND WITH CONTRAST  Technique:  Multiplanar, multiecho pulse sequences of the brain and surrounding structures were obtained according to standard protocol without and with intravenous contrast  Contrast: 20mL MULTIHANCE GADOBENATE DIMEGLUMINE 529 MG/ML IV SOLN  Comparison: CT 03/28/2013  Findings: Multiple enhancing mass lesions are present compatible with metastatic disease.  The largest lesion in the left frontal lobe measures 16 x 16 mm and has a large  amount of surrounding white matter edema. Minimal associated hemorrhage.  4 mm enhancing lesion is seen posterior to this lesion.  8 x 17 mm periventricular lesion on the left. 5 mm enhancing lesion right parietal lobe with mild edema 8 x 12 mm left cerebellar lesion. Mild cerebellar edema on the left. 5 mm left cerebellar lesion 6 x 10 mm left posterior inferior cerebellar lesion.  No significant mass effect on the fourth ventricle is present.  Ventricle size is normal.  Mild atrophy is present.  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  2 mm midline shift to the right.  Negative for acute infarct.  There is a small amount of restricted diffusion in the left periventricular white matter lesion and a small amount of restricted diffusion in the larger left frontal lesion.  IMPRESSION: Multiple enhancing lesions in the brain compatible with metastatic disease.  There is a large amount of white matter edema in the left frontal lobe with 3 enhancing masses in the left frontal lobe. Mild midline shift 2 mm.   Original Report Authenticated By: Janeece Riggers, M.D.   Ct Abdomen Pelvis W  Contrast  03/31/2013   *RADIOLOGY REPORT*  Clinical Data:  Metastasis to the brain.  History of smoking. Evaluate for lung cancer.  CT CHEST, ABDOMEN AND PELVIS WITH CONTRAST  Technique: Contiguous axial images of the chest abdomen and pelvis were obtained after IV contrast administration.  Contrast: 100  ml Omnipaque-300  Comparison: Plain film chest 03/28/2013.  No prior CTs.  CT CHEST  Findings: Lung windows demonstrate probable secretions within the trachea.  Mild centrilobular emphysema.  Multiple bilateral pulmonary nodules, most consistent with metastatic disease.  Index right upper lobe lung nodule measures 1.2 cm on image 17/series 3. Index right lower lobe lung nodule measures 2.1 cm on image 34/series 3. Index left upper lobe nodule measures 1.1 cm on image 12/series 3.  Soft tissue windows demonstrate no supraclavicular adenopathy. Aortic and coronary artery atherosclerosis.  Heart size upper normal, without pericardial effusion.  No pleural fluid. No central pulmonary embolism, on this non-dedicated study.  Left paratracheal 1.7 cm node on image 19/series 2. 1.6 cm subcarinal node on image 26/series 2. Bovine arch.  Small hiatal hernia.  IMPRESSION:  1.  Extensive pulmonary metastasis. 2.  Mediastinal adenopathy, suspicious for nodal metastasis. 3.  Atherosclerosis, including within the coronary arteries. 4.  Small hiatal hernia.  CT ABDOMEN AND PELVIS  Findings:  Normal liver, spleen, distal stomach, pancreas, gallbladder, biliary tract, adrenal glands, left kidney.  Indeterminate upper pole right renal lesion of 1.9 cm.  Infiltrative irregular mass arising from the interpolar right kidney which measures 14.1 x 12.2 cm on image 76/series 2.  12.0 cm on coronal image 49.  Tumor extends towards and may involve the right renal vein, including on image 67/series 2.  There is extensive neovascularity.  Aortic atherosclerosis.  11 mm left periaortic node on image 74.  Normal colon and terminal ileum.   Normal small bowel without abdominal ascites.    No pelvic adenopathy.  Normal urinary bladder.  Mild prostatomegaly.  Probable bone islands in the right pelvis.  Minimal wedging of the superior endplate of L1, without canal compromise.  Disc bulge at L4-L5.  IMPRESSION:  1.  Infiltrative right renal mass, most consistent with renal cell carcinoma.  Extension towards and probable involvement of the right renal vein. 2.  Retroperitoneal adenopathy, suspicious for nodal metastasis. 3.  Indeterminate upper pole right renal  lesion, of doubtful clinical significance. 4.  Prostatomegaly.   Original Report Authenticated By: Jeronimo Greaves, M.D.    Medications:  I have reviewed the patient's current medications. Scheduled: . atorvastatin  40 mg Oral q1800  . dexamethasone  6 mg Intravenous Q6H  . insulin aspart  0-20 Units Subcutaneous TID WC  . insulin aspart  0-5 Units Subcutaneous QHS  . insulin glargine  40 Units Subcutaneous BID  . levETIRAcetam  500 mg Oral BID  . omega-3 acid ethyl esters  1 g Oral BID  . pantoprazole  40 mg Oral Q1200    Assessment/Plan: No further seizures noted.  Patient tolerating po Keppra.  Remains on dexamethasone.  MRI of brain reviewed and shows multiple enhancing lesions likely metastatic in origin.  Search for primary in progress-from above imaging likely renal in origin.    Recommendations: 1.  Agree with current management.     LOS: 3 days   Thana Farr, MD [facility] [phone] 03/31/2013  1:57 PM

## 2013-03-31 NOTE — Progress Notes (Signed)
Speech Language Pathology Dysphagia Treatment Patient Details Name: George [name] MRN: [id number] DOB: September 08, 1945 Today's Date: 03/31/2013 Time: 4098-1191 SLP Time Calculation (min): 8 min  Assessment / Plan / Recommendation Clinical Impression  Pt. seen for tolerance of regular diet texture and thin liquids with wife present.  SLP explained/reviewed puropse of bedside swallow evaluation on 8/26 and outcome.  Pt. was observed with thin liquid without s/s aspiration and no cueing needed at this time.  Continue regular diet and thin liquids.  ST  to sign off.     Diet Recommendation  Continue with Current Diet: Regular;Thin liquid    SLP Plan All goals met   Pertinent Vitals/Pain none   Swallowing Goals  SLP Swallowing Goals Patient will utilize recommended strategies during swallow to increase swallowing safety with: Modified independent assistance Swallow Study Goal #2 - Progress: Met  General Temperature Spikes Noted: No Respiratory Status: Room air Behavior/Cognition: Alert;Cooperative;Pleasant mood Oral Cavity - Dentition: Adequate natural dentition Patient Positioning: Upright in bed  Oral Cavity - Oral Hygiene Does patient have any of the following "at risk" factors?: None of the above Brush patient's teeth BID with toothbrush (using toothpaste with fluoride): Yes   Dysphagia Treatment Treatment focused on: Skilled observation of diet tolerance Treatment Methods/Modalities: Skilled observation Patient observed directly with PO's: Yes Type of PO's observed: Thin liquids Feeding: Able to feed self Liquids provided via: Cup Amount of cueing:  (independent)   GO     Royce Macadamia M.Ed [employer] [phone] 03/31/2013

## 2013-03-31 NOTE — Progress Notes (Signed)
[facility] Progress Note [place] TEAM 1 - Stepdown/ICU TEAM   HARLES EVETTS UUV:[id number] DOB: 03-16-1946 DOA: 03/28/2013 PCP: No primary provider on file.  Brief narrative:  67 y.o. male w/ a history of CAD status post stent, diabetes mellitus type 2, and hyperlipidemia who was found on the floor in his house by his wife that around 6 PM. Patient was found to be initially confused but was arousable. Patient had incontinence of urine. Later patient was able to stand up. Did not have any focal deficits. The last seen normal was on 2 PM. Patient was brought to the ER. CT head showed features concerning for mass versus stroke. While in the ER the patient was seen to have a focal seizure with right eye deviation and tonic clonic activity. Neurologist on-call Dr. Amada Jupiter evaluated the patient and the patient was started on Keppra and Decadron for possible seizures and brain mass. Family further described a several day history of difficulty finding his words, and forgetting what he was going to say.  Additionally, patient's troponin was found to be elevated with EKG showing no ST elevations. On-call Cardiologist was consulted by ER physician. Patient did have some chest discomfort the week prior to his admit, and was originally scheduled for cardiac catheter the day of his admission.   Assessment/Plan:  Focal Seizure - found unresponsive by wife - suspected to be due to seizures - placed on Keppra for now - likely due to below  Multiple brain mets - largest lesion in  Frontal lobe with mild midline shift and mild hemorrhage - difficulty with word finding per wife and patient, which has reportedly been progressive for weeks (neighbors noted prior to family in retrospect) - Decadron started - EEG reveals "mild generalized slowing of cerebral activity which was slightly more prominent involving the left hemisphere compared to the right, consistent with patient's history of probable left  cerebral mass lesion." - may need to irradiate  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- pt having right flank pain- discussed with Urology- Dr Isabel Caprice- will follow and we will decide upon plan - after discussion with pat, will try Vicodin for flank pain  Nonspecific troponin elevation  - evaluated by [facility]- no further plans for cath for now - was due for a cath but due to above finding, cath has been postponed (cannot give blood thinners)  - was taking ASA - will not resume this for now  Diabetes mellitus - takes lantus 60 U at bedtime (though not listed on med rec) - CBG poorly controlled due to steroid - adjust tx and follow   Hyperlipidemia - takes fish oil at home   Code Status: FULL Family Communication: care discussed w/ pt in detail   Consultants: Neuro Cardio  Procedures: none  Antibiotics: none  DVT prophylaxis: SCDs  HPI/Subjective: Pt has pain in right flank-mentions that it has been there for over a week- interestingly only mentions it after I discuss the right renal mass with him- quite emotional and tearful- have answered questions as much as I am able-   Objective: Blood pressure 112/59, pulse 52, temperature 97.7 F (36.5 C), temperature source Oral, resp. rate 16, height 6\' 2"  (1.88 m), weight 96.3 kg (212 lb 4.9 oz), SpO2 97.00%.  Intake/Output Summary (Last 24 hours) at 03/31/13 1902 Last data filed at 03/31/13 1500  Gross per 24 hour  Intake 1222.5 ml  Output   2235 ml  Net -  1012.5 ml    Exam: General: No acute respiratory distress Lungs: Clear to auscultation bilaterally without wheezes or crackles Cardiovascular: Regular rate and rhythm without murmur gallop or rub normal S1 and S2 Abdomen: Nontender, nondistended, soft, bowel sounds positive, no rebound, no ascites, no appreciable mass Extremities: No significant cyanosis, clubbing, or edema bilateral lower extremities Neuro:  Alert and  oriented - no confusion - no clear CN deficits  Back: right CVA tenderness  Data Reviewed: Basic Metabolic Panel:  Recent Labs Lab 03/28/13 1904 03/29/13 0700 03/29/13 1745  NA 137 138 134*  K 4.4 4.1 4.6  CL 102 104 99  CO2 25 22 25   GLUCOSE 241* 268* 471*  BUN 27* 23 29*  CREATININE 0.87 0.75 1.08  CALCIUM 10.7* 10.3 10.1   Liver Function Tests:  Recent Labs Lab 03/28/13 1904 03/29/13 0700  AST 17 23  ALT 19 17  ALKPHOS 96 95  BILITOT 0.3 0.4  PROT 7.0 7.2  ALBUMIN 3.4* 3.4*   CBC:  Recent Labs Lab 03/28/13 1904 03/29/13 0700  WBC 10.7* 6.9  NEUTROABS 8.8* 6.1  HGB 12.0* 12.0*  HCT 34.2* 35.7*  MCV 84.4 84.4  PLT 210 181   Cardiac Enzymes:  Recent Labs Lab 03/28/13 1904 03/29/13 0130 03/29/13 0700 03/29/13 1429  TROPONINI 1.23* 1.68* 1.32* 0.95*   CBG:  Recent Labs Lab 03/30/13 1900 03/30/13 2155 03/31/13 0827 03/31/13 1209 03/31/13 1847  GLUCAP 388* 389* 248* 337* 341*    Recent Results (from the past 240 hour(s))  MRSA PCR SCREENING     Status: None   Collection Time    03/29/13  1:10 AM      Result Value Range Status   MRSA by PCR NEGATIVE  NEGATIVE Final   Comment:            The GeneXpert MRSA Assay (FDA     approved for NASAL specimens     only), is one component of a     comprehensive MRSA colonization     surveillance program. It is not     intended to diagnose MRSA     infection nor to guide or     monitor treatment for     MRSA infections.     Studies:  Recent x-ray studies have been reviewed in detail by the Attending Physician  Scheduled Meds:  Scheduled Meds: . atorvastatin  40 mg Oral q1800  . dexamethasone  6 mg Intravenous Q6H  . insulin aspart  0-20 Units Subcutaneous TID WC  . insulin aspart  0-5 Units Subcutaneous QHS  . insulin glargine  40 Units Subcutaneous BID  . levETIRAcetam  500 mg Oral BID  . omega-3 acid ethyl esters  1 g Oral BID  . pantoprazole  40 mg Oral Q1200    Time spent on care  of this patient: 35 min   Melinda Gwinner, MD  [facility] Office  [phone] Pager - Text Page per Loretha Stapler as per below:  On-Call/Text Page:      [email or web address]      password [email or web address]  If 7PM-7AM, please contact night-coverage [email or web address] Password [email or web address] 03/31/2013, 7:02 PM   LOS: 3 days

## 2013-04-01 ENCOUNTER — Ambulatory Visit
Admit: 2013-04-01 | Discharge: 2013-04-01 | Disposition: A | Discharge status: Home / Self Care | Payer: Medicare Other | Attending: Radiation Oncology | Admitting: Radiation Oncology

## 2013-04-01 LAB — GLUCOSE, CAPILLARY
Glucose-Capillary: 247 mg/dL — ABNORMAL HIGH (ref 70–99)
Glucose-Capillary: 264 mg/dL — ABNORMAL HIGH (ref 70–99)

## 2013-04-01 MED ORDER — SIMVASTATIN 80 MG PO TABS
80.0000 mg | ORAL_TABLET | Freq: Every day | ORAL | Status: DC
  Administered 2013-04-01 – 2013-04-04 (×4): 80 mg via ORAL
  Filled 2013-04-01 (×5): qty 1

## 2013-04-01 MED ORDER — INSULIN GLARGINE 100 UNIT/ML ~~LOC~~ SOLN
50.0000 [IU] | Freq: Two times a day (BID) | SUBCUTANEOUS | Status: DC
  Administered 2013-04-01 – 2013-04-03 (×5): 50 [IU] via SUBCUTANEOUS
  Filled 2013-04-01 (×6): qty 0.5

## 2013-04-01 MED ORDER — MAGNESIUM HYDROXIDE 400 MG/5ML PO SUSP
15.0000 mL | ORAL | Status: AC
  Administered 2013-04-01: 15 mL via ORAL
  Filled 2013-04-01: qty 30

## 2013-04-01 MED ORDER — DOCUSATE SODIUM 100 MG PO CAPS
100.0000 mg | ORAL_CAPSULE | Freq: Every evening | ORAL | Status: DC | PRN
  Administered 2013-04-01 – 2013-04-04 (×6): 100 mg via ORAL
  Filled 2013-04-01 (×10): qty 1

## 2013-04-01 MED ORDER — PSYLLIUM 95 % PO PACK
1.0000 | PACK | Freq: Every day | ORAL | Status: DC
  Administered 2013-04-01: 1 via ORAL
  Administered 2013-04-02: 11:00:00 via ORAL
  Administered 2013-04-03: 1 via ORAL
  Filled 2013-04-01 (×5): qty 1

## 2013-04-01 MED ORDER — DEXAMETHASONE 4 MG PO TABS
4.0000 mg | ORAL_TABLET | Freq: Four times a day (QID) | ORAL | Status: DC
  Administered 2013-04-01 – 2013-04-03 (×7): 4 mg via ORAL
  Filled 2013-04-01 (×11): qty 1

## 2013-04-01 NOTE — Progress Notes (Signed)
Had discussion with him.  Tearful. Please let us know if we can be of further assistance. Agree with Dr. Mayford Knife that any further cardiac workup takes a back seat to oncology issues.   Will sign off.

## 2013-04-01 NOTE — Progress Notes (Addendum)
[facility] Progress Note Parchment TEAM 1 - Stepdown/ICU TEAM   RAFFAELE DERISE WGN:[id number] DOB: 1945/09/16 DOA: 03/28/2013 PCP: No primary provider on file.  Brief narrative:  67 y.o. male w/ a history of CAD status post stent, diabetes mellitus type 2, and hyperlipidemia who was found on the floor in his house by his wife that around 6 PM. Patient was found to be initially confused but was arousable. Patient had incontinence of urine. Later patient was able to stand up. Did not have any focal deficits. The last seen normal was on 2 PM. Patient was brought to the ER. CT head showed features concerning for mass versus stroke. While in the ER the patient was seen to have a focal seizure with right eye deviation and tonic clonic activity. Neurologist on-call Dr. Amada Jupiter evaluated the patient and the patient was started on Keppra and Decadron for possible seizures and brain mass. Family further described a several day history of difficulty finding his words, and forgetting what he was going to say.  Additionally, patient's troponin was found to be elevated with EKG showing no ST elevations. On-call Cardiologist was consulted by ER physician. Patient did have some chest discomfort the week prior to his admit, and was originally scheduled for cardiac catheter the day of his admission.  MRI of the brain reveals mets to the brain. Further CT imaging ordered to find primary.  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  Urology consulted as well but no need for nephrectomy at this time.   Assessment/Plan:  Seizure  - found unresponsive by wife and suspected to be due to seizure -seized in ER  - placed on Keppra for now - EEG reveals "mild generalized slowing of cerebral activity which was slightly more prominent involving the left hemisphere compared to the right, consistent with patient's history of probable left cerebral  mass lesion." - likely due to below  Multiple brain mets - largest lesion in  Frontal lobe with mild midline shift and mild hemorrhage - difficulty with word finding per wife and patient, which has reportedly been progressive for weeks (neighbors noted prior to family in retrospect) - Decadron started- switching to PO today-  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- pt having right flank pain- discussed with Urology- Dr Isabel Caprice has seen him this morning- no nee for nephrectomy unless pt symptomatic with pain- see his note - after discussion with pat, will try Vicodin for flank pain- he states it is effective- watch for constipation - IR consulted for biopsy- will call Onc consult once tissue diagnosis obtained  Nonspecific troponin elevation  - evaluated by [facility]- no further plans for cath for now - was due for a cath but due to above finding, cath has been postponed (cannot give blood thinners)  - was taking ASA - will not resume this for now  Diabetes mellitus - takes lantus 60 U at bedtime - CBG poorly controlled due to steroids - adjusting tx - follow   Hyperlipidemia - takes fish oil at home   Code Status: FULL Family Communication: care discussed w/ pt and wife   Consultants: Neuro Cardio  Procedures: none  Antibiotics: none  DVT prophylaxis: SCDs  HPI/Subjective: Patient's flank pain better with 2 Vicodin- have had extensive discussion with pt and wife about obtaining a biopsy and the f/u with rad/onc and oncology (  once tissue diagnosis obtained)- have discussed current management of his blood glucose levels and changing Decadron to PO. Have discussed transfer to med/surg floor.   Objective: Blood pressure 127/63, pulse 58, temperature 97.6 F (36.4 C), temperature source Oral, resp. rate 21, height 6\' 2"  (1.88 m), weight 96.3 kg (212 lb 4.9 oz), SpO2 99.00%.  Intake/Output Summary (Last 24 hours) at 04/01/13  1551 Last data filed at 04/01/13 1236  Gross per 24 hour  Intake    970 ml  Output   3325 ml  Net  -2355 ml    Exam: General: No acute respiratory distress Lungs: Clear to auscultation bilaterally without wheezes or crackles Cardiovascular: Regular rate and rhythm without murmur gallop or rub normal S1 and S2 Abdomen: Nontender, nondistended, soft, bowel sounds positive, no rebound, no ascites, no appreciable mass Extremities: No significant cyanosis, clubbing, or edema bilateral lower extremities Neuro:  Alert and oriented - no confusion - no clear CN deficits  Back: right CVA tenderness  Data Reviewed: Basic Metabolic Panel:  Recent Labs Lab 03/28/13 1904 03/29/13 0700 03/29/13 1745  NA 137 138 134*  K 4.4 4.1 4.6  CL 102 104 99  CO2 25 22 25   GLUCOSE 241* 268* 471*  BUN 27* 23 29*  CREATININE 0.87 0.75 1.08  CALCIUM 10.7* 10.3 10.1   Liver Function Tests:  Recent Labs Lab 03/28/13 1904 03/29/13 0700  AST 17 23  ALT 19 17  ALKPHOS 96 95  BILITOT 0.3 0.4  PROT 7.0 7.2  ALBUMIN 3.4* 3.4*   CBC:  Recent Labs Lab 03/28/13 1904 03/29/13 0700  WBC 10.7* 6.9  NEUTROABS 8.8* 6.1  HGB 12.0* 12.0*  HCT 34.2* 35.7*  MCV 84.4 84.4  PLT 210 181   Cardiac Enzymes:  Recent Labs Lab 03/28/13 1904 03/29/13 0130 03/29/13 0700 03/29/13 1429  TROPONINI 1.23* 1.68* 1.32* 0.95*   CBG:  Recent Labs Lab 03/31/13 0827 03/31/13 1209 03/31/13 1847 03/31/13 2139 04/01/13 0840  GLUCAP 248* 337* 341* 380* 247*    Recent Results (from the past 240 hour(s))  MRSA PCR SCREENING     Status: None   Collection Time    03/29/13  1:10 AM      Result Value Range Status   MRSA by PCR NEGATIVE  NEGATIVE Final   Comment:            The GeneXpert MRSA Assay (FDA     approved for NASAL specimens     only), is one component of a     comprehensive MRSA colonization     surveillance program. It is not     intended to diagnose MRSA     infection nor to guide or      monitor treatment for     MRSA infections.     Studies:  Recent x-ray studies have been reviewed in detail by the Attending Physician  Scheduled Meds:  Scheduled Meds: . atorvastatin  40 mg Oral q1800  . dexamethasone  4 mg Oral Q6H  . docusate sodium  100 mg Oral QHS,MR X 1  . insulin aspart  0-20 Units Subcutaneous TID WC  . insulin aspart  0-5 Units Subcutaneous QHS  . insulin glargine  50 Units Subcutaneous BID  . levETIRAcetam  500 mg Oral BID  . omega-3 acid ethyl esters  1 g Oral BID  . pantoprazole  40 mg Oral Q1200  . psyllium  1 packet Oral Daily    Time spent on care of this patient:  35 min   Calvert Cantor, MD  [facility] Office  [phone] Pager - Text Page per Amion as per below:  On-Call/Text Page:      [email or web address]      password [email or web address]  If 7PM-7AM, please contact night-coverage [email or web address] Password [facility] 04/01/2013, 3:51 PM   LOS: 4 days

## 2013-04-01 NOTE — Progress Notes (Signed)
Pt arrived as a transfer from 3300. Pt alert and oriented. C/O mild pain to right flank. VSS. No signs of acute distress. Oriented pt to unit. Bed in low position. Call bell within reach. Will continue to monitor.

## 2013-04-01 NOTE — Progress Notes (Signed)
Patient ID: George [name], male   DOB: 1946-07-16, 67 y.o.   MRN: [id number] Request received for percutaneous biopsy of most accessible site in pt with large right renal mass with brain, lung lesions and assoc adenopathy. Imaging studies were reviewed by Dr. Daryll Brod and he feels most accessible/safest site would be right renal mass. Additional PMH as below. Exam: pt awake/alert; chest- CTA bilat; heart- RRR; abd- soft,+BS,NT,+right CVA tenderness; ext- no edema.   Filed Vitals:   04/01/13 0844 04/01/13 0900 04/01/13 1200 04/01/13 1236  BP: 120/63   127/63  Pulse:  63 58   Temp: 98 F (36.7 C)   97.6 F (36.4 C)  TempSrc: Oral   Oral  Resp:  15 21   Height:      Weight:      SpO2:  100% 99%    Past Medical History  Diagnosis Date  . Coronary artery disease   . Diabetes mellitus without complication   . Hyperlipidemia    Past Surgical History  Procedure Laterality Date  . Cardiac catheterization     Dg Chest 2 View  03/28/2013   *RADIOLOGY REPORT*  Clinical Data: Altered mental status, fall  CHEST - 2 VIEW  Comparison: None.  Findings: Cardiac leads overlie the chest.  Heart size is normal. Lung volumes are low but clear.  No pleural effusion.  No acute osseous finding. Mild central bronchial wall thickening noted.  IMPRESSION: No focal pulmonary opacity.  Mild central bronchial wall thickening which may indicate reactive airways disease or bronchitis.   Original Report Authenticated By: Conchita Paris, M.D.   Ct Head Wo Contrast  03/28/2013   *RADIOLOGY REPORT*  Clinical Data:  Altered mental status and fall  CT HEAD WITHOUT CONTRAST CT CERVICAL SPINE WITHOUT CONTRAST  Technique:  Multidetector CT imaging of the head and cervical spine was performed following the standard protocol without intravenous contrast.  Multiplanar CT image reconstructions of the cervical spine were also generated.  Comparison:  CT 03/26/1999 is not digitized.  Report is reviewed.  CT HEAD  Findings: There  is a large area of abnormal hypo attenuation within the gray matter and extending to the cortical surface in the left frontoparietal lobe region.  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  No acute hemorrhage is seen.  No sulcal effacement.  No midline shift.  No skull fracture. Orbits and paranasal sinuses are intact.  IMPRESSION: Abnormal area of predominately white matter left frontoparietal lobe hypodensity, which may indicate acute versus subacute infarct. Infiltrative neoplasm such as glioblastoma multiforme is possible but considered less likely given absence of focal effacement.  No acute hemorrhage.  CT CERVICAL SPINE  Findings: C1 through the cervical thoracic junction is visualized in its entirety. No precervical soft tissue widening is present. No fracture or dislocation.  Alignment is normal.  IMPRESSION: No acute cervical spine abnormality.  Critical Value/emergent results were called by telephone at the time of interpretation on 03/28/2013 at 8:25 p.m. to Dr. Regenia Skeeter, who verbally acknowledged these results.   Original Report Authenticated By: Conchita Paris, M.D.   Ct Chest W Contrast  03/31/2013   *RADIOLOGY REPORT*  Clinical Data:  Metastasis to the brain.  History of smoking. Evaluate for lung cancer.  CT CHEST, ABDOMEN AND PELVIS WITH CONTRAST  Technique: Contiguous axial images of the chest abdomen and pelvis were obtained after IV contrast administration.  Contrast: 100  ml Omnipaque-300  Comparison: Plain film chest 03/28/2013.  No prior CTs.  CT  CHEST  Findings: Lung windows demonstrate probable secretions within the trachea.  Mild centrilobular emphysema.  Multiple bilateral pulmonary nodules, most consistent with metastatic disease.  Index right upper lobe lung nodule measures 1.2 cm on image 17/series 3. Index right lower lobe lung nodule measures 2.1 cm on image 34/series 3. Index left upper lobe nodule measures 1.1 cm on image 12/series 3.  Soft  tissue windows demonstrate no supraclavicular adenopathy. Aortic and coronary artery atherosclerosis.  Heart size upper normal, without pericardial effusion.  No pleural fluid. No central pulmonary embolism, on this non-dedicated study.  Left paratracheal 1.7 cm node on image 19/series 2. 1.6 cm subcarinal node on image 26/series 2. Bovine arch.  Small hiatal hernia.  IMPRESSION:  1.  Extensive pulmonary metastasis. 2.  Mediastinal adenopathy, suspicious for nodal metastasis. 3.  Atherosclerosis, including within the coronary arteries. 4.  Small hiatal hernia.  CT ABDOMEN AND PELVIS  Findings:  Normal liver, spleen, distal stomach, pancreas, gallbladder, biliary tract, adrenal glands, left kidney.  Indeterminate upper pole right renal lesion of 1.9 cm.  Infiltrative irregular mass arising from the interpolar right kidney which measures 14.1 x 12.2 cm on image 76/series 2.  12.0 cm on coronal image 49.  Tumor extends towards and may involve the right renal vein, including on image 67/series 2.  There is extensive neovascularity.  Aortic atherosclerosis.  11 mm left periaortic node on image 74.  Normal colon and terminal ileum.  Normal small bowel without abdominal ascites.    No pelvic adenopathy.  Normal urinary bladder.  Mild prostatomegaly.  Probable bone islands in the right pelvis.  Minimal wedging of the superior endplate of L1, without canal compromise.  Disc bulge at L4-L5.  IMPRESSION:  1.  Infiltrative right renal mass, most consistent with renal cell carcinoma.  Extension towards and probable involvement of the right renal vein. 2.  Retroperitoneal adenopathy, suspicious for nodal metastasis. 3.  Indeterminate upper pole right renal lesion, of doubtful clinical significance. 4.  Prostatomegaly.   Original Report Authenticated By: Abigail Miyamoto, M.D.   Ct Cervical Spine Wo Contrast  03/28/2013   *RADIOLOGY REPORT*  Clinical Data:  Altered mental status and fall  CT HEAD WITHOUT CONTRAST CT CERVICAL SPINE  WITHOUT CONTRAST  Technique:  Multidetector CT imaging of the head and cervical spine was performed following the standard protocol without intravenous contrast.  Multiplanar CT image reconstructions of the cervical spine were also generated.  Comparison:  CT 03/26/1999 is not digitized.  Report is reviewed.  CT HEAD  Findings: There is a large area of abnormal hypo attenuation within the gray matter and extending to the cortical surface in the left frontoparietal lobe region.  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  No acute hemorrhage is seen.  No sulcal effacement.  No midline shift.  No skull fracture. Orbits and paranasal sinuses are intact.  IMPRESSION: Abnormal area of predominately white matter left frontoparietal lobe hypodensity, which may indicate acute versus subacute infarct. Infiltrative neoplasm such as glioblastoma multiforme is possible but considered less likely given absence of focal effacement.  No acute hemorrhage.  CT CERVICAL SPINE  Findings: C1 through the cervical thoracic junction is visualized in its entirety. No precervical soft tissue widening is present. No fracture or dislocation.  Alignment is normal.  IMPRESSION: No acute cervical spine abnormality.  Critical Value/emergent results were called by telephone at the time of interpretation on 03/28/2013 at 8:25 p.m. to Dr. Regenia Skeeter, who verbally acknowledged these results.  Original Report Authenticated By: Christiana Pellant, M.D.   Mr Laqueta Jean Wo Contrast  03/30/2013   *RADIOLOGY REPORT*  Clinical Data: Seizure  MRI HEAD WITHOUT AND WITH CONTRAST  Technique:  Multiplanar, multiecho pulse sequences of the brain and surrounding structures were obtained according to standard protocol without and with intravenous contrast  Contrast: 20mL MULTIHANCE GADOBENATE DIMEGLUMINE 529 MG/ML IV SOLN  Comparison: CT 03/28/2013  Findings: Multiple enhancing mass lesions are present compatible with metastatic  disease.  The largest lesion in the left frontal lobe measures 16 x 16 mm and has a large amount of surrounding white matter edema. Minimal associated hemorrhage.  4 mm enhancing lesion is seen posterior to this lesion.  8 x 17 mm periventricular lesion on the left. 5 mm enhancing lesion right parietal lobe with mild edema 8 x 12 mm left cerebellar lesion. Mild cerebellar edema on the left. 5 mm left cerebellar lesion 6 x 10 mm left posterior inferior cerebellar lesion.  No significant mass effect on the fourth ventricle is present.  Ventricle size is normal.  Mild atrophy is present.  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  2 mm midline shift to the right.  Negative for acute infarct.  There is a small amount of restricted diffusion in the left periventricular white matter lesion and a small amount of restricted diffusion in the larger left frontal lesion.  IMPRESSION: Multiple enhancing lesions in the brain compatible with metastatic disease.  There is a large amount of white matter edema in the left frontal lobe with 3 enhancing masses in the left frontal lobe. Mild midline shift 2 mm.   Original Report Authenticated By: Janeece Riggers, M.D.   Ct Abdomen Pelvis W Contrast  03/31/2013   *RADIOLOGY REPORT*  Clinical Data:  Metastasis to the brain.  History of smoking. Evaluate for lung cancer.  CT CHEST, ABDOMEN AND PELVIS WITH CONTRAST  Technique: Contiguous axial images of the chest abdomen and pelvis were obtained after IV contrast administration.  Contrast: 100  ml Omnipaque-300  Comparison: Plain film chest 03/28/2013.  No prior CTs.  CT CHEST  Findings: Lung windows demonstrate probable secretions within the trachea.  Mild centrilobular emphysema.  Multiple bilateral pulmonary nodules, most consistent with metastatic disease.  Index right upper lobe lung nodule measures 1.2 cm on image 17/series 3. Index right lower lobe lung nodule measures 2.1 cm on image 34/series 3. Index  left upper lobe nodule measures 1.1 cm on image 12/series 3.  Soft tissue windows demonstrate no supraclavicular adenopathy. Aortic and coronary artery atherosclerosis.  Heart size upper normal, without pericardial effusion.  No pleural fluid. No central pulmonary embolism, on this non-dedicated study.  Left paratracheal 1.7 cm node on image 19/series 2. 1.6 cm subcarinal node on image 26/series 2. Bovine arch.  Small hiatal hernia.  IMPRESSION:  1.  Extensive pulmonary metastasis. 2.  Mediastinal adenopathy, suspicious for nodal metastasis. 3.  Atherosclerosis, including within the coronary arteries. 4.  Small hiatal hernia.  CT ABDOMEN AND PELVIS  Findings:  Normal liver, spleen, distal stomach, pancreas, gallbladder, biliary tract, adrenal glands, left kidney.  Indeterminate upper pole right renal lesion of 1.9 cm.  Infiltrative irregular mass arising from the interpolar right kidney which measures 14.1 x 12.2 cm on image 76/series 2.  12.0 cm on coronal image 49.  Tumor extends towards and may involve the right renal vein, including on image 67/series 2.  There is extensive neovascularity.  Aortic atherosclerosis.  11  mm left periaortic node on image 74.  Normal colon and terminal ileum.  Normal small bowel without abdominal ascites.    No pelvic adenopathy.  Normal urinary bladder.  Mild prostatomegaly.  Probable bone islands in the right pelvis.  Minimal wedging of the superior endplate of L1, without canal compromise.  Disc bulge at L4-L5.  IMPRESSION:  1.  Infiltrative right renal mass, most consistent with renal cell carcinoma.  Extension towards and probable involvement of the right renal vein. 2.  Retroperitoneal adenopathy, suspicious for nodal metastasis. 3.  Indeterminate upper pole right renal lesion, of doubtful clinical significance. 4.  Prostatomegaly.   Original Report Authenticated By: Jeronimo Greaves, M.D.  Results for orders placed during the hospital encounter of 03/28/13  MRSA PCR SCREENING       Result Value Range   MRSA by PCR NEGATIVE  NEGATIVE  CBC WITH DIFFERENTIAL      Result Value Range   WBC 10.7 (*) 4.0 - 10.5 K/uL   RBC 4.05 (*) 4.22 - 5.81 MIL/uL   Hemoglobin 12.0 (*) 13.0 - 17.0 g/dL   HCT 30.8 (*) 65.7 - 84.6 %   MCV 84.4  78.0 - 100.0 fL   MCH 29.6  26.0 - 34.0 pg   MCHC 35.1  30.0 - 36.0 g/dL   RDW 96.2  95.2 - 84.1 %   Platelets 210  150 - 400 K/uL   Neutrophils Relative % 83 (*) 43 - 77 %   Neutro Abs 8.8 (*) 1.7 - 7.7 K/uL   Lymphocytes Relative 9 (*) 12 - 46 %   Lymphs Abs 0.9  0.7 - 4.0 K/uL   Monocytes Relative 8  3 - 12 %   Monocytes Absolute 0.8  0.1 - 1.0 K/uL   Eosinophils Relative 1  0 - 5 %   Eosinophils Absolute 0.1  0.0 - 0.7 K/uL   Basophils Relative 0  0 - 1 %   Basophils Absolute 0.0  0.0 - 0.1 K/uL  COMPREHENSIVE METABOLIC PANEL      Result Value Range   Sodium 137  135 - 145 mEq/L   Potassium 4.4  3.5 - 5.1 mEq/L   Chloride 102  96 - 112 mEq/L   CO2 25  19 - 32 mEq/L   Glucose, Bld 241 (*) 70 - 99 mg/dL   BUN 27 (*) 6 - 23 mg/dL   Creatinine, Ser 3.24  0.50 - 1.35 mg/dL   Calcium 40.1 (*) 8.4 - 10.5 mg/dL   Total Protein 7.0  6.0 - 8.3 g/dL   Albumin 3.4 (*) 3.5 - 5.2 g/dL   AST 17  0 - 37 U/L   ALT 19  0 - 53 U/L   Alkaline Phosphatase 96  39 - 117 U/L   Total Bilirubin 0.3  0.3 - 1.2 mg/dL   GFR calc non Af Amer 88 (*) >90 mL/min   GFR calc Af Amer >90  >90 mL/min  TROPONIN I      Result Value Range   Troponin I 1.23 (*) <0.30 ng/mL  GLUCOSE, CAPILLARY      Result Value Range   Glucose-Capillary 173 (*) 70 - 99 mg/dL  COMPREHENSIVE METABOLIC PANEL      Result Value Range   Sodium 138  135 - 145 mEq/L   Potassium 4.1  3.5 - 5.1 mEq/L   Chloride 104  96 - 112 mEq/L   CO2 22  19 - 32 mEq/L   Glucose, Bld 268 (*) 70 -  99 mg/dL   BUN 23  6 - 23 mg/dL   Creatinine, Ser 0.75  0.50 - 1.35 mg/dL   Calcium 10.3  8.4 - 10.5 mg/dL   Total Protein 7.2  6.0 - 8.3 g/dL   Albumin 3.4 (*) 3.5 - 5.2 g/dL   AST 23  0 - 37 U/L    ALT 17  0 - 53 U/L   Alkaline Phosphatase 95  39 - 117 U/L   Total Bilirubin 0.4  0.3 - 1.2 mg/dL   GFR calc non Af Amer >90  >90 mL/min   GFR calc Af Amer >90  >90 mL/min  CBC WITH DIFFERENTIAL      Result Value Range   WBC 6.9  4.0 - 10.5 K/uL   RBC 4.23  4.22 - 5.81 MIL/uL   Hemoglobin 12.0 (*) 13.0 - 17.0 g/dL   HCT 35.7 (*) 39.0 - 52.0 %   MCV 84.4  78.0 - 100.0 fL   MCH 28.4  26.0 - 34.0 pg   MCHC 33.6  30.0 - 36.0 g/dL   RDW 13.8  11.5 - 15.5 %   Platelets 181  150 - 400 K/uL   Neutrophils Relative % 89 (*) 43 - 77 %   Neutro Abs 6.1  1.7 - 7.7 K/uL   Lymphocytes Relative 10 (*) 12 - 46 %   Lymphs Abs 0.7  0.7 - 4.0 K/uL   Monocytes Relative 1 (*) 3 - 12 %   Monocytes Absolute 0.1  0.1 - 1.0 K/uL   Eosinophils Relative 0  0 - 5 %   Eosinophils Absolute 0.0  0.0 - 0.7 K/uL   Basophils Relative 0  0 - 1 %   Basophils Absolute 0.0  0.0 - 0.1 K/uL  HEMOGLOBIN A1C      Result Value Range   Hemoglobin A1C 7.4 (*) <5.7 %   Mean Plasma Glucose 166 (*) <117 mg/dL  TROPONIN I      Result Value Range   Troponin I 1.68 (*) <0.30 ng/mL  TROPONIN I      Result Value Range   Troponin I 1.32 (*) <0.30 ng/mL  TROPONIN I      Result Value Range   Troponin I 0.95 (*) <0.30 ng/mL  LIPID PANEL      Result Value Range   Cholesterol 129  0 - 200 mg/dL   Triglycerides 46  <150 mg/dL   HDL 35 (*) >39 mg/dL   Total CHOL/HDL Ratio 3.7     VLDL 9  0 - 40 mg/dL   LDL Cholesterol 85  0 - 99 mg/dL  PARATHYROID HORMONE, INTACT (NO CA)      Result Value Range   PTH 3.6 (*) 14.0 - 72.0 pg/mL  VITAMIN D 25 HYDROXY      Result Value Range   Vit D, 25-Hydroxy 23 (*) 30 - 89 ng/mL  GLUCOSE, CAPILLARY      Result Value Range   Glucose-Capillary 297 (*) 70 - 99 mg/dL   Comment 1 Documented in Chart     Comment 2 Notify RN    GLUCOSE, CAPILLARY      Result Value Range   Glucose-Capillary 298 (*) 70 - 99 mg/dL  GLUCOSE, CAPILLARY      Result Value Range   Glucose-Capillary 467 (*) 70 - 99  mg/dL  BASIC METABOLIC PANEL      Result Value Range   Sodium 134 (*) 135 - 145 mEq/L   Potassium 4.6  3.5 - 5.1 mEq/L   Chloride 99  96 - 112 mEq/L   CO2 25  19 - 32 mEq/L   Glucose, Bld 471 (*) 70 - 99 mg/dL   BUN 29 (*) 6 - 23 mg/dL   Creatinine, Ser 1.61  0.50 - 1.35 mg/dL   Calcium 09.6  8.4 - 04.5 mg/dL   GFR calc non Af Amer 70 (*) >90 mL/min   GFR calc Af Amer 81 (*) >90 mL/min  GLUCOSE, CAPILLARY      Result Value Range   Glucose-Capillary 384 (*) 70 - 99 mg/dL   Comment 1 Notify RN     Comment 2 Documented in Chart    GLUCOSE, CAPILLARY      Result Value Range   Glucose-Capillary 327 (*) 70 - 99 mg/dL   Comment 1 Documented in Chart     Comment 2 Notify RN    GLUCOSE, CAPILLARY      Result Value Range   Glucose-Capillary 471 (*) 70 - 99 mg/dL   Comment 1 Notify RN     Comment 2 Documented in Chart    GLUCOSE, CAPILLARY      Result Value Range   Glucose-Capillary 389 (*) 70 - 99 mg/dL   Comment 1 Notify RN     Comment 2 Documented in Chart    GLUCOSE, CAPILLARY      Result Value Range   Glucose-Capillary 388 (*) 70 - 99 mg/dL  GLUCOSE, CAPILLARY      Result Value Range   Glucose-Capillary 248 (*) 70 - 99 mg/dL   Comment 1 Documented in Chart     Comment 2 Notify RN    GLUCOSE, CAPILLARY      Result Value Range   Glucose-Capillary 337 (*) 70 - 99 mg/dL   Comment 1 Documented in Chart     Comment 2 Notify RN    GLUCOSE, CAPILLARY      Result Value Range   Glucose-Capillary 341 (*) 70 - 99 mg/dL   Comment 1 Documented in Chart     Comment 2 Notify RN    GLUCOSE, CAPILLARY      Result Value Range   Glucose-Capillary 380 (*) 70 - 99 mg/dL   Comment 1 Notify RN     Comment 2 Documented in Chart    GLUCOSE, CAPILLARY      Result Value Range   Glucose-Capillary 247 (*) 70 - 99 mg/dL   Comment 1 Documented in Chart     Comment 2 Notify RN     A/P: Pt with large right renal mass, lung/brain lesions and adenopathy. Tent plan is for US guided right renal  mass biopsy on 9/2. Details/risks of procedure d/w pt/wife with their understanding and consent.

## 2013-04-01 NOTE — Progress Notes (Signed)
Patient being transferred to 6E,room 14. Phone report has been called to [facility]. Patient and wife are aware of the transfer.

## 2013-04-01 NOTE — Consult Note (Signed)
Radiation Oncology         [phone] ________________________________  Name: George [name] MRN: [id number]  Date: 03/28/2013  DOB: 06/25/1946    DIAGNOSIS:  Metastatic cancer with brain metastasis, tissue diagnosis pending  HISTORY OF PRESENT ILLNESS::George [name] is a 67 y.o. male who is seen for an initial consultation visit. The patient indicates that he suffered from a seizure prior to admission. He had several seizures including in the emergency room and was admitted for further workup. A CT scan of head demonstrated a large area of hypoattenuation for which further workup was recommended. The patient has undergone an MRI scan of the brain on 03/30/2013. This showed multiple intracranial lesions consistent with metastatic cancer. This scan showed what appeared to be 7 lesions with the largest measuring 16 x 16 mm in the left frontal lobe. Some significant edema was present. Patient also has undergone a CT scan of the chest abdomen and pelvis. This showed multiple pulmonary nodules consistent with pulmonary metastasis in addition to mediastinal adenopathy suspicious for nodal metastasis. A very large infiltrative right renal mass was present which appeared to be most consistent with renal cell carcinoma. Retroperitoneal adenopathy was also seen, suspicious for nodal metastasis.  The patient indicates to me today that he is doing relatively well. He denies any further seizures. He has been started on Rub medication in addition to steroids. The patient does complain of some discomfort in the right flank region, denies other areas of pain.  A biopsy is scheduled for early next week.   PREVIOUS RADIATION THERAPY: No   PAST MEDICAL HISTORY:  has a past medical history of Coronary artery disease; Diabetes mellitus without complication; and Hyperlipidemia.     PAST SURGICAL HISTORY: Past Surgical History  Procedure Laterality Date  . Cardiac catheterization       FAMILY HISTORY:  family history is not on file.   SOCIAL HISTORY:  reports that he has been smoking Cigarettes.  He has been smoking about 0.50 packs per day. He does not have any smokeless tobacco history on file. He reports that he does not drink alcohol or use illicit drugs.   ALLERGIES: Metformin and related and Simvastatin   MEDICATIONS:  Current Facility-Administered Medications  Medication Dose Route Frequency Provider Last Rate Last Dose  . 0.9 %  sodium chloride infusion   Intravenous Continuous Lonia Blood, MD 50 mL/hr at 04/01/13 1500    . dexamethasone (DECADRON) tablet 4 mg  4 mg Oral Q6H Calvert Cantor, MD   4 mg at 04/01/13 1517  . docusate sodium (COLACE) capsule 100 mg  100 mg Oral QHS,MR X 1 Saima Rizwan, MD      . HYDROcodone-acetaminophen (NORCO/VICODIN) 5-325 MG per tablet 1-2 tablet  1-2 tablet Oral Q6H PRN Calvert Cantor, MD   2 tablet at 04/01/13 1858  . insulin aspart (novoLOG) injection 0-20 Units  0-20 Units Subcutaneous TID WC Lonia Blood, MD   20 Units at 04/01/13 1806  . insulin aspart (novoLOG) injection 0-5 Units  0-5 Units Subcutaneous QHS Lonia Blood, MD   5 Units at 03/31/13 2147  . insulin glargine (LANTUS) injection 50 Units  50 Units Subcutaneous BID Calvert Cantor, MD   50 Units at 04/01/13 1023  . levETIRAcetam (KEPPRA) tablet 500 mg  500 mg Oral BID Thana Farr, MD   500 mg at 04/01/13 1023  . LORazepam (ATIVAN) injection 1 mg  1 mg Intravenous Q4H PRN Eduard Clos, MD      .  omega-3 acid ethyl esters (LOVAZA) capsule 1 g  1 g Oral BID Lonia Blood, MD   1 g at 04/01/13 1023  . pantoprazole (PROTONIX) EC tablet 40 mg  40 mg Oral Q1200 Lonia Blood, MD   40 mg at 04/01/13 1244  . psyllium (HYDROCIL/METAMUCIL) packet 1 packet  1 packet Oral Daily Calvert Cantor, MD   1 packet at 04/01/13 1516  . senna-docusate (Senokot-S) tablet 1 tablet  1 tablet Oral QHS PRN Eduard Clos, MD   1 tablet at 03/30/13 1951  . simvastatin (ZOCOR)  tablet 80 mg  80 mg Oral QHS Fayne Norrie, [facility]         REVIEW OF SYSTEMS:  A 15 point review of systems is documented in the electronic medical record. This was obtained by the nursing staff. However, I reviewed this with the patient to discuss relevant findings and make appropriate changes.  Pertinent items are noted in HPI.    PHYSICAL EXAM:  height is 6\' 2"  (1.88 m) and weight is 212 lb 4.9 oz (96.3 kg). His oral temperature is 98.2 F (36.8 C). His blood pressure is 139/59 and his pulse is 60. His respiration is 17 and oxygen saturation is 95%.   General: Well-developed, in no acute distress HEENT: Normocephalic, atraumatic Cardiovascular: Regular rate and rhythm Respiratory: Clear to auscultation bilaterally GI: Soft, nontender, normal bowel sounds Extremities: No edema present    LABORATORY DATA:  Lab Results  Component Value Date   WBC 6.9 03/29/2013   HGB 12.0* 03/29/2013   HCT 35.7* 03/29/2013   MCV 84.4 03/29/2013   PLT 181 03/29/2013   Lab Results  Component Value Date   NA 134* 03/29/2013   K 4.6 03/29/2013   CL 99 03/29/2013   CO2 25 03/29/2013   Lab Results  Component Value Date   ALT 17 03/29/2013   AST 23 03/29/2013   ALKPHOS 95 03/29/2013   BILITOT 0.4 03/29/2013      RADIOGRAPHY: Dg Chest 2 View  03/28/2013   *RADIOLOGY REPORT*  Clinical Data: Altered mental status, fall  CHEST - 2 VIEW  Comparison: None.  Findings: Cardiac leads overlie the chest.  Heart size is normal. Lung volumes are low but clear.  No pleural effusion.  No acute osseous finding. Mild central bronchial wall thickening noted.  IMPRESSION: No focal pulmonary opacity.  Mild central bronchial wall thickening which may indicate reactive airways disease or bronchitis.   Original Report Authenticated By: Christiana Pellant, M.D.   Ct Head Wo Contrast  03/28/2013   *RADIOLOGY REPORT*  Clinical Data:  Altered mental status and fall  CT HEAD WITHOUT CONTRAST CT CERVICAL SPINE WITHOUT CONTRAST   Technique:  Multidetector CT imaging of the head and cervical spine was performed following the standard protocol without intravenous contrast.  Multiplanar CT image reconstructions of the cervical spine were also generated.  Comparison:  CT 03/26/1999 is not digitized.  Report is reviewed.  CT HEAD  Findings: There is a large area of abnormal hypo attenuation within the gray matter and extending to the cortical surface in the left frontoparietal lobe region.  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  No acute hemorrhage is seen.  No sulcal effacement.  No midline shift.  No skull fracture. Orbits and paranasal sinuses are intact.  IMPRESSION: Abnormal area of predominately white matter left frontoparietal lobe hypodensity, which may indicate acute versus subacute infarct. Infiltrative neoplasm such as glioblastoma multiforme is possible but considered  less likely given absence of focal effacement.  No acute hemorrhage.  CT CERVICAL SPINE  Findings: C1 through the cervical thoracic junction is visualized in its entirety. No precervical soft tissue widening is present. No fracture or dislocation.  Alignment is normal.  IMPRESSION: No acute cervical spine abnormality.  Critical Value/emergent results were called by telephone at the time of interpretation on 03/28/2013 at 8:25 p.m. to Dr. Regenia Skeeter, who verbally acknowledged these results.   Original Report Authenticated By: Conchita Paris, M.D.   Ct Chest W Contrast  03/31/2013   *RADIOLOGY REPORT*  Clinical Data:  Metastasis to the brain.  History of smoking. Evaluate for lung cancer.  CT CHEST, ABDOMEN AND PELVIS WITH CONTRAST  Technique: Contiguous axial images of the chest abdomen and pelvis were obtained after IV contrast administration.  Contrast: 100  ml Omnipaque-300  Comparison: Plain film chest 03/28/2013.  No prior CTs.  CT CHEST  Findings: Lung windows demonstrate probable secretions within the trachea.  Mild  centrilobular emphysema.  Multiple bilateral pulmonary nodules, most consistent with metastatic disease.  Index right upper lobe lung nodule measures 1.2 cm on image 17/series 3. Index right lower lobe lung nodule measures 2.1 cm on image 34/series 3. Index left upper lobe nodule measures 1.1 cm on image 12/series 3.  Soft tissue windows demonstrate no supraclavicular adenopathy. Aortic and coronary artery atherosclerosis.  Heart size upper normal, without pericardial effusion.  No pleural fluid. No central pulmonary embolism, on this non-dedicated study.  Left paratracheal 1.7 cm node on image 19/series 2. 1.6 cm subcarinal node on image 26/series 2. Bovine arch.  Small hiatal hernia.  IMPRESSION:  1.  Extensive pulmonary metastasis. 2.  Mediastinal adenopathy, suspicious for nodal metastasis. 3.  Atherosclerosis, including within the coronary arteries. 4.  Small hiatal hernia.  CT ABDOMEN AND PELVIS  Findings:  Normal liver, spleen, distal stomach, pancreas, gallbladder, biliary tract, adrenal glands, left kidney.  Indeterminate upper pole right renal lesion of 1.9 cm.  Infiltrative irregular mass arising from the interpolar right kidney which measures 14.1 x 12.2 cm on image 76/series 2.  12.0 cm on coronal image 49.  Tumor extends towards and may involve the right renal vein, including on image 67/series 2.  There is extensive neovascularity.  Aortic atherosclerosis.  11 mm left periaortic node on image 74.  Normal colon and terminal ileum.  Normal small bowel without abdominal ascites.    No pelvic adenopathy.  Normal urinary bladder.  Mild prostatomegaly.  Probable bone islands in the right pelvis.  Minimal wedging of the superior endplate of L1, without canal compromise.  Disc bulge at L4-L5.  IMPRESSION:  1.  Infiltrative right renal mass, most consistent with renal cell carcinoma.  Extension towards and probable involvement of the right renal vein. 2.  Retroperitoneal adenopathy, suspicious for nodal  metastasis. 3.  Indeterminate upper pole right renal lesion, of doubtful clinical significance. 4.  Prostatomegaly.   Original Report Authenticated By: Abigail Miyamoto, M.D.   Ct Cervical Spine Wo Contrast  03/28/2013   *RADIOLOGY REPORT*  Clinical Data:  Altered mental status and fall  CT HEAD WITHOUT CONTRAST CT CERVICAL SPINE WITHOUT CONTRAST  Technique:  Multidetector CT imaging of the head and cervical spine was performed following the standard protocol without intravenous contrast.  Multiplanar CT image reconstructions of the cervical spine were also generated.  Comparison:  CT 03/26/1999 is not digitized.  Report is reviewed.  CT HEAD  Findings: There is a large area of abnormal hypo attenuation within  the gray matter and extending to the cortical surface in the left frontoparietal lobe region.  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  No acute hemorrhage is seen.  No sulcal effacement.  No midline shift.  No skull fracture. Orbits and paranasal sinuses are intact.  IMPRESSION: Abnormal area of predominately white matter left frontoparietal lobe hypodensity, which may indicate acute versus subacute infarct. Infiltrative neoplasm such as glioblastoma multiforme is possible but considered less likely given absence of focal effacement.  No acute hemorrhage.  CT CERVICAL SPINE  Findings: C1 through the cervical thoracic junction is visualized in its entirety. No precervical soft tissue widening is present. No fracture or dislocation.  Alignment is normal.  IMPRESSION: No acute cervical spine abnormality.  Critical Value/emergent results were called by telephone at the time of interpretation on 03/28/2013 at 8:25 p.m. to Dr. Criss Alvine, who verbally acknowledged these results.   Original Report Authenticated By: Christiana Pellant, M.D.   Mr Laqueta Jean Wo Contrast  03/30/2013   *RADIOLOGY REPORT*  Clinical Data: Seizure  MRI HEAD WITHOUT AND WITH CONTRAST  Technique:  Multiplanar,  multiecho pulse sequences of the brain and surrounding structures were obtained according to standard protocol without and with intravenous contrast  Contrast: 20mL MULTIHANCE GADOBENATE DIMEGLUMINE 529 MG/ML IV SOLN  Comparison: CT 03/28/2013  Findings: Multiple enhancing mass lesions are present compatible with metastatic disease.  The largest lesion in the left frontal lobe measures 16 x 16 mm and has a large amount of surrounding white matter edema. Minimal associated hemorrhage.  4 mm enhancing lesion is seen posterior to this lesion.  8 x 17 mm periventricular lesion on the left. 5 mm enhancing lesion right parietal lobe with mild edema 8 x 12 mm left cerebellar lesion. Mild cerebellar edema on the left. 5 mm left cerebellar lesion 6 x 10 mm left posterior inferior cerebellar lesion.  No significant mass effect on the fourth ventricle is present.  Ventricle size is normal.  Mild atrophy is present.  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  2 mm midline shift to the right.  Negative for acute infarct.  There is a small amount of restricted diffusion in the left periventricular white matter lesion and a small amount of restricted diffusion in the larger left frontal lesion.  IMPRESSION: Multiple enhancing lesions in the brain compatible with metastatic disease.  There is a large amount of white matter edema in the left frontal lobe with 3 enhancing masses in the left frontal lobe. Mild midline shift 2 mm.   Original Report Authenticated By: Janeece Riggers, M.D.   Ct Abdomen Pelvis W Contrast  03/31/2013   *RADIOLOGY REPORT*  Clinical Data:  Metastasis to the brain.  History of smoking. Evaluate for lung cancer.  CT CHEST, ABDOMEN AND PELVIS WITH CONTRAST  Technique: Contiguous axial images of the chest abdomen and pelvis were obtained after IV contrast administration.  Contrast: 100  ml Omnipaque-300  Comparison: Plain film chest 03/28/2013.  No prior CTs.  CT CHEST  Findings: Lung  windows demonstrate probable secretions within the trachea.  Mild centrilobular emphysema.  Multiple bilateral pulmonary nodules, most consistent with metastatic disease.  Index right upper lobe lung nodule measures 1.2 cm on image 17/series 3. Index right lower lobe lung nodule measures 2.1 cm on image 34/series 3. Index left upper lobe nodule measures 1.1 cm on image 12/series 3.  Soft tissue windows demonstrate no supraclavicular adenopathy. Aortic and coronary artery atherosclerosis.  Heart size upper normal, without pericardial effusion.  No pleural fluid. No central pulmonary embolism, on this non-dedicated study.  Left paratracheal 1.7 cm node on image 19/series 2. 1.6 cm subcarinal node on image 26/series 2. Bovine arch.  Small hiatal hernia.  IMPRESSION:  1.  Extensive pulmonary metastasis. 2.  Mediastinal adenopathy, suspicious for nodal metastasis. 3.  Atherosclerosis, including within the coronary arteries. 4.  Small hiatal hernia.  CT ABDOMEN AND PELVIS  Findings:  Normal liver, spleen, distal stomach, pancreas, gallbladder, biliary tract, adrenal glands, left kidney.  Indeterminate upper pole right renal lesion of 1.9 cm.  Infiltrative irregular mass arising from the interpolar right kidney which measures 14.1 x 12.2 cm on image 76/series 2.  12.0 cm on coronal image 49.  Tumor extends towards and may involve the right renal vein, including on image 67/series 2.  There is extensive neovascularity.  Aortic atherosclerosis.  11 mm left periaortic node on image 74.  Normal colon and terminal ileum.  Normal small bowel without abdominal ascites.    No pelvic adenopathy.  Normal urinary bladder.  Mild prostatomegaly.  Probable bone islands in the right pelvis.  Minimal wedging of the superior endplate of L1, without canal compromise.  Disc bulge at L4-L5.  IMPRESSION:  1.  Infiltrative right renal mass, most consistent with renal cell carcinoma.  Extension towards and probable involvement of the right renal  vein. 2.  Retroperitoneal adenopathy, suspicious for nodal metastasis. 3.  Indeterminate upper pole right renal lesion, of doubtful clinical significance. 4.  Prostatomegaly.   Original Report Authenticated By: Abigail Miyamoto, M.D.       IMPRESSION: The patient has an apparent diagnosis of metastatic cancer with brain metastasis. 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 The patient has multiple intracranial metastases, at least 7 on his MRI scan. He also has a very large tumor within the right abdomen associated with the right kidney.  The patient is an appropriate candidate for palliative radiotherapy for his brain metastasis. I would favor whole brain radiotherapy for his case given the number of lesions and the patient was interested in this approach. The patient would be borderline for an alternative treatment with radiosurgery, and the patient expressed an interest in whole brain radiotherapy instead. The patient may be an appropriate candidate for palliative radiotherapy to the right renal tumor. I will discuss this with him further.   PLAN:  -The patient has been started on Keppra and Decadron -Urology consult has been completed and there is not a recommendation for a right nephrectomy at this time -Medical oncology appointment pending -Biopsy pending, reportedly next Tuesday -I will schedule the patient for a simulation such that we can proceed with treatment planning. I would like to have the biopsy result prior to beginning treatment. Therefore, the earliest date which we would likely start would be on Wednesday, 04/06/2013.    I spent 55 minutes minutes face to face with the patient and more than 50% of that time was spent in counseling and/or coordination of care.    ________________________________   Jodelle Gross, MD, PhD

## 2013-04-02 LAB — GLUCOSE, CAPILLARY
Glucose-Capillary: 186 mg/dL — ABNORMAL HIGH (ref 70–99)
Glucose-Capillary: 174 mg/dL — ABNORMAL HIGH (ref 70–99)
Glucose-Capillary: 252 mg/dL — ABNORMAL HIGH (ref 70–99)

## 2013-04-02 MED ORDER — INSULIN ASPART 100 UNIT/ML ~~LOC~~ SOLN
5.0000 [IU] | Freq: Three times a day (TID) | SUBCUTANEOUS | Status: DC
  Administered 2013-04-02 – 2013-04-03 (×3): 5 [IU] via SUBCUTANEOUS

## 2013-04-02 MED ORDER — METOPROLOL TARTRATE 25 MG PO TABS
25.0000 mg | ORAL_TABLET | Freq: Two times a day (BID) | ORAL | Status: DC
  Administered 2013-04-02 – 2013-04-03 (×2): 25 mg via ORAL
  Filled 2013-04-02 (×6): qty 1

## 2013-04-02 NOTE — Progress Notes (Addendum)
Patient ID: MCCLAIN SHALL, male   DOB: [birth year], 67 y.o.   MRN: [id number]   Pt has some flank pain on right that is controlled with po pain meds. Voiding well. No hematuria.   Filed Vitals:   04/02/13 0909  BP: 100/48  Pulse: 60  Temp: 97.5 F (36.4 C)  Resp: 18   NAD A&Ox3 MAE, no focal deficits Abd - soft, NT, ND  Imp - metastatic cancer with brain metastasis, large right renal mass -- likely renal cell carcinoma. Pt had more questions about his stage of disease and the role of surgical therapy/nephrectomy. We discussed the role of cytoreductive nephrectomy with neoadjuvant vs. Adjuvant systemic therapy. With his stage of disease and with newer agents more data is emerging on the role of neoadjuvant therapy which is likely the path we will take in this patient in conjunction with Rad Onc treatment.   Plan - -Bx pending Tuesday -Rad Onc simulation Wed -Med Onc consult needed -Pt stable from GU point of view. Could potentially be discharged home and f/u with IR, Rad Onc and Med Onc next week if felt to be medically stable.   I should add I reviewed the CT chest abd, pelvis, CT Head and MRI head images independently.

## 2013-04-02 NOTE — Progress Notes (Signed)
[facility]                                                                                Patient Demographics  George [name], is a 67 y.o. male, DOB - Jun 24, 1946, JYN:[id number], QMV:[id number]  Admit date - 03/28/2013  Admitting Physician Eduard Clos, MD  Outpatient Primary MD for the patient is No primary provider on file.  LOS - 5   Chief Complaint  Patient presents with  . Altered Mental Status  . Fall        Assessment & Plan    Brief narrative:   67 y.o. male w/ a history of CAD status post stent, diabetes mellitus type 2, and hyperlipidemia who was found on the floor in his house by his wife that around 6 PM. Patient was found to be initially confused but was arousable. Patient had incontinence of urine. Later patient was able to stand up. Did not have any focal deficits. The last seen normal was on 2 PM. Patient was brought to the ER. CT head showed features concerning for mass versus stroke. While in the ER the patient was seen to have a focal seizure with right eye deviation and tonic clonic activity.   Neurologist on-call Dr. Amada Jupiter evaluated the patient and the patient was started on Keppra and Decadron for possible seizures and brain mass. Family further described a several day history of difficulty finding his words, and forgetting what he was going to say. Additionally, patient's troponin was found to be elevated with EKG showing no ST elevations. On-call Cardiologist was consulted by ER physician. Patient did have some chest discomfort the week prior to his admit, and was originally scheduled for cardiac catheter the day of his admission.   MRI of the brain reveals mets to the brain. Further CT imaging ordered to find primary.  Pt has been found to have a right renal mass and metastasis to the brain and lungs- IR will biopsy renal mass.Urology consulted as well but no need for nephrectomy at this time.     Assessment/Plan:   Seizure  - found  unresponsive by wife and suspected to be due to seizure  -seized in ER  - placed on Keppra for now  - EEG reveals "mild generalized slowing of cerebral activity which was slightly more prominent involving the left hemisphere compared to the right, consistent with patient's history of probable left cerebral mass lesion."  - likely due to below     Multiple brain mets - largest lesion in Frontal lobe with mild midline shift and mild hemorrhage  - difficulty with word finding per wife and patient, which has reportedly been progressive for weeks (neighbors noted prior to family in retrospect)  - Decadron started.    Real mass with pulmonary mets  -right kidney is likely the origin of the cancer- pt having right flank pain- discussed with Urology- Dr Isabel Caprice has seen him - no need for nephrectomy unless pt symptomatic with pain- see his note  - after discussion with pat, will try Vicodin for flank pain- he states it is effective- watch for constipation  - IR consulted for biopsy which is scheduled for 04/05/2013, -  will call Onc consult once tissue diagnosis obtained      Nonspecific troponin elevation  - evaluated by [facility]- no further plans for cath for now  - was due for a cath but due to above finding, cath has been postponed (cannot give blood thinners)  - was taking ASA - will not resume this for now  Continue on statin we'll add low-dose beta blocker   Diabetes mellitus  - takes lantus 60 U at bedtime , will add pre-meal NovoLog and continue sliding scale insulin for now - CBG poorly controlled due to steroids - adjusting tx - follow   Lab Results  Component Value Date   HGBA1C 7.4* 03/29/2013    CBG (last 3)   Recent Labs  04/01/13 1612 04/01/13 2146 04/02/13 0732  GLUCAP 380* 306* 186*      Hyperlipidemia  - takes fish oil at home, on statin here     Code Status: Full  Family Communication: None present  Disposition Plan: Home   Procedures  CT chest abdomen, CT neck, MR brain, IR guided renal biopsy scheduled for 04-05-2013   Consults  IR, Neuro, cards, urology   DVT Prophylaxis    SCDs   Lab Results  Component Value Date   PLT 181 03/29/2013    Medications  Scheduled Meds: . dexamethasone  4 mg Oral Q6H  . docusate sodium  100 mg Oral QHS,MR X 1  . insulin aspart  0-20 Units Subcutaneous TID WC  . insulin aspart  0-5 Units Subcutaneous QHS  . insulin glargine  50 Units Subcutaneous BID  . levETIRAcetam  500 mg Oral BID  . omega-3 acid ethyl esters  1 g Oral BID  . pantoprazole  40 mg Oral Q1200  . psyllium  1 packet Oral Daily  . simvastatin  80 mg Oral QHS   Continuous Infusions: . sodium chloride 50 mL/hr at 04/01/13 1500   PRN Meds:.HYDROcodone-acetaminophen, LORazepam, senna-docusate  Antibiotics     Anti-infectives   None       Time Spent in minutes   35   SINGH,PRASHANT K M.D on 04/02/2013 at 8:49 AM  Between 7am to 7pm - Pager - [phone]  After 7pm go to [email or web address] - password [email or web address]  And look for the night coverage person covering for me after hours  [facility] Office  [phone]    Subjective:   George [name] today has, No headache, No chest pain, No abdominal pain - No Nausea, No new weakness tingling or numbness, No Cough - SOB.    Objective:   Filed Vitals:   04/01/13 1236 04/01/13 1617 04/01/13 2000 04/02/13 0507  BP: 127/63 151/66 139/59 115/67  Pulse:  64 60 59  Temp: 97.6 F (36.4 C) 97.6 F (36.4 C) 98.2 F (36.8 C) 97.6 F (36.4 C)  TempSrc: Oral Oral Oral Oral  Resp:  17  17  Height:      Weight:   99.474 kg (219 lb 4.8 oz)   SpO2:  91% 95% 98%    Wt Readings from Last 3 Encounters:  04/01/13 99.474 kg (219 lb 4.8 oz)  04/01/13 99.474 kg (219 lb 4.8 oz)  04/01/13 99.474 kg (219 lb 4.8 oz)     Intake/Output Summary (Last 24 hours) at 04/02/13 0849 Last data filed at 04/02/13 0553  Gross per 24 hour  Intake 1264.17 ml  Output    200  ml  Net 1064.17 ml    Exam Awake Alert, Oriented X 3,  No new F.N deficits, Normal affect Gray.AT,PERRAL Supple Neck,No JVD, No cervical lymphadenopathy appriciated.  Symmetrical Chest wall movement, Good air movement bilaterally, CTAB RRR,No Gallops,Rubs or new Murmurs, No Parasternal Heave +ve B.Sounds, Abd Soft, Non tender, No organomegaly appriciated, No rebound - guarding or rigidity. No Cyanosis, Clubbing or edema, No new Rash or bruise      Data Review   Micro Results Recent Results (from the past 240 hour(s))  MRSA PCR SCREENING     Status: None   Collection Time    03/29/13  1:10 AM      Result Value Range Status   MRSA by PCR NEGATIVE  NEGATIVE Final   Comment:            The GeneXpert MRSA Assay (FDA     approved for NASAL specimens     only), is one component of a     comprehensive MRSA colonization     surveillance program. It is not     intended to diagnose MRSA     infection nor to guide or     monitor treatment for     MRSA infections.    Radiology Reports Dg Chest 2 View  03/28/2013   *RADIOLOGY REPORT*  Clinical Data: Altered mental status, fall  CHEST - 2 VIEW  Comparison: None.  Findings: Cardiac leads overlie the chest.  Heart size is normal. Lung volumes are low but clear.  No pleural effusion.  No acute osseous finding. Mild central bronchial wall thickening noted.  IMPRESSION: No focal pulmonary opacity.  Mild central bronchial wall thickening which may indicate reactive airways disease or bronchitis.   Original Report Authenticated By: Conchita Paris, M.D.   Ct Head Wo Contrast  03/28/2013   *RADIOLOGY REPORT*  Clinical Data:  Altered mental status and fall  CT HEAD WITHOUT CONTRAST CT CERVICAL SPINE WITHOUT CONTRAST  Technique:  Multidetector CT imaging of the head and cervical spine was performed following the standard protocol without intravenous contrast.  Multiplanar CT image reconstructions of the cervical spine were also generated.  Comparison:  CT  03/26/1999 is not digitized.  Report is reviewed.  CT HEAD  Findings: There is a large area of abnormal hypo attenuation within the gray matter and extending to the cortical surface in the left frontoparietal lobe region.  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  No acute hemorrhage is seen.  No sulcal effacement.  No midline shift.  No skull fracture. Orbits and paranasal sinuses are intact.  IMPRESSION: Abnormal area of predominately white matter left frontoparietal lobe hypodensity, which may indicate acute versus subacute infarct. Infiltrative neoplasm such as glioblastoma multiforme is possible but considered less likely given absence of focal effacement.  No acute hemorrhage.  CT CERVICAL SPINE  Findings: C1 through the cervical thoracic junction is visualized in its entirety. No precervical soft tissue widening is present. No fracture or dislocation.  Alignment is normal.  IMPRESSION: No acute cervical spine abnormality.  Critical Value/emergent results were called by telephone at the time of interpretation on 03/28/2013 at 8:25 p.m. to Dr. Regenia Skeeter, who verbally acknowledged these results.   Original Report Authenticated By: Conchita Paris, M.D.   Ct Chest W Contrast  03/31/2013   *RADIOLOGY REPORT*  Clinical Data:  Metastasis to the brain.  History of smoking. Evaluate for lung cancer.  CT CHEST, ABDOMEN AND PELVIS WITH CONTRAST  Technique: Contiguous axial images of the chest abdomen and pelvis were obtained after IV contrast administration.  Contrast: 100  ml Omnipaque-300  Comparison: Plain film chest 03/28/2013.  No prior CTs.  CT CHEST  Findings: Lung windows demonstrate probable secretions within the trachea.  Mild centrilobular emphysema.  Multiple bilateral pulmonary nodules, most consistent with metastatic disease.  Index right upper lobe lung nodule measures 1.2 cm on image 17/series 3. Index right lower lobe lung nodule measures 2.1 cm on image 34/series 3.  Index left upper lobe nodule measures 1.1 cm on image 12/series 3.  Soft tissue windows demonstrate no supraclavicular adenopathy. Aortic and coronary artery atherosclerosis.  Heart size upper normal, without pericardial effusion.  No pleural fluid. No central pulmonary embolism, on this non-dedicated study.  Left paratracheal 1.7 cm node on image 19/series 2. 1.6 cm subcarinal node on image 26/series 2. Bovine arch.  Small hiatal hernia.  IMPRESSION:  1.  Extensive pulmonary metastasis. 2.  Mediastinal adenopathy, suspicious for nodal metastasis. 3.  Atherosclerosis, including within the coronary arteries. 4.  Small hiatal hernia.  CT ABDOMEN AND PELVIS  Findings:  Normal liver, spleen, distal stomach, pancreas, gallbladder, biliary tract, adrenal glands, left kidney.  Indeterminate upper pole right renal lesion of 1.9 cm.  Infiltrative irregular mass arising from the interpolar right kidney which measures 14.1 x 12.2 cm on image 76/series 2.  12.0 cm on coronal image 49.  Tumor extends towards and may involve the right renal vein, including on image 67/series 2.  There is extensive neovascularity.  Aortic atherosclerosis.  11 mm left periaortic node on image 74.  Normal colon and terminal ileum.  Normal small bowel without abdominal ascites.    No pelvic adenopathy.  Normal urinary bladder.  Mild prostatomegaly.  Probable bone islands in the right pelvis.  Minimal wedging of the superior endplate of L1, without canal compromise.  Disc bulge at L4-L5.  IMPRESSION:  1.  Infiltrative right renal mass, most consistent with renal cell carcinoma.  Extension towards and probable involvement of the right renal vein. 2.  Retroperitoneal adenopathy, suspicious for nodal metastasis. 3.  Indeterminate upper pole right renal lesion, of doubtful clinical significance. 4.  Prostatomegaly.   Original Report Authenticated By: Jeronimo Greaves, M.D.   Ct Cervical Spine Wo Contrast  03/28/2013   *RADIOLOGY REPORT*  Clinical Data:   Altered mental status and fall  CT HEAD WITHOUT CONTRAST CT CERVICAL SPINE WITHOUT CONTRAST  Technique:  Multidetector CT imaging of the head and cervical spine was performed following the standard protocol without intravenous contrast.  Multiplanar CT image reconstructions of the cervical spine were also generated.  Comparison:  CT 03/26/1999 is not digitized.  Report is reviewed.  CT HEAD  Findings: There is a large area of abnormal hypo attenuation within the gray matter and extending to the cortical surface in the left frontoparietal lobe region.  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  No acute hemorrhage is seen.  No sulcal effacement.  No midline shift.  No skull fracture. Orbits and paranasal sinuses are intact.  IMPRESSION: Abnormal area of predominately white matter left frontoparietal lobe hypodensity, which may indicate acute versus subacute infarct. Infiltrative neoplasm such as glioblastoma multiforme is possible but considered less likely given absence of focal effacement.  No acute hemorrhage.  CT CERVICAL SPINE  Findings: C1 through the cervical thoracic junction is visualized in its entirety. No precervical soft tissue widening is present. No fracture or dislocation.  Alignment is normal.  IMPRESSION: No acute cervical spine abnormality.  Critical Value/emergent results were called by telephone at the time of interpretation on  03/28/2013 at 8:25 p.m. to Dr. Criss Alvine, who verbally acknowledged these results.   Original Report Authenticated By: Christiana Pellant, M.D.   Mr Laqueta Jean Wo Contrast  03/30/2013   *RADIOLOGY REPORT*  Clinical Data: Seizure  MRI HEAD WITHOUT AND WITH CONTRAST  Technique:  Multiplanar, multiecho pulse sequences of the brain and surrounding structures were obtained according to standard protocol without and with intravenous contrast  Contrast: 20mL MULTIHANCE GADOBENATE DIMEGLUMINE 529 MG/ML IV SOLN  Comparison: CT 03/28/2013  Findings:  Multiple enhancing mass lesions are present compatible with metastatic disease.  The largest lesion in the left frontal lobe measures 16 x 16 mm and has a large amount of surrounding white matter edema. Minimal associated hemorrhage.  4 mm enhancing lesion is seen posterior to this lesion.  8 x 17 mm periventricular lesion on the left. 5 mm enhancing lesion right parietal lobe with mild edema 8 x 12 mm left cerebellar lesion. Mild cerebellar edema on the left. 5 mm left cerebellar lesion 6 x 10 mm left posterior inferior cerebellar lesion.  No significant mass effect on the fourth ventricle is present.  Ventricle size is normal.  Mild atrophy is present.  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  2 mm midline shift to the right.  Negative for acute infarct.  There is a small amount of restricted diffusion in the left periventricular white matter lesion and a small amount of restricted diffusion in the larger left frontal lesion.  IMPRESSION: Multiple enhancing lesions in the brain compatible with metastatic disease.  There is a large amount of white matter edema in the left frontal lobe with 3 enhancing masses in the left frontal lobe. Mild midline shift 2 mm.   Original Report Authenticated By: Janeece Riggers, M.D.   Ct Abdomen Pelvis W Contrast  03/31/2013   *RADIOLOGY REPORT*  Clinical Data:  Metastasis to the brain.  History of smoking. Evaluate for lung cancer.  CT CHEST, ABDOMEN AND PELVIS WITH CONTRAST  Technique: Contiguous axial images of the chest abdomen and pelvis were obtained after IV contrast administration.  Contrast: 100  ml Omnipaque-300  Comparison: Plain film chest 03/28/2013.  No prior CTs.  CT CHEST  Findings: Lung windows demonstrate probable secretions within the trachea.  Mild centrilobular emphysema.  Multiple bilateral pulmonary nodules, most consistent with metastatic disease.  Index right upper lobe lung nodule measures 1.2 cm on image 17/series 3. Index right  lower lobe lung nodule measures 2.1 cm on image 34/series 3. Index left upper lobe nodule measures 1.1 cm on image 12/series 3.  Soft tissue windows demonstrate no supraclavicular adenopathy. Aortic and coronary artery atherosclerosis.  Heart size upper normal, without pericardial effusion.  No pleural fluid. No central pulmonary embolism, on this non-dedicated study.  Left paratracheal 1.7 cm node on image 19/series 2. 1.6 cm subcarinal node on image 26/series 2. Bovine arch.  Small hiatal hernia.  IMPRESSION:  1.  Extensive pulmonary metastasis. 2.  Mediastinal adenopathy, suspicious for nodal metastasis. 3.  Atherosclerosis, including within the coronary arteries. 4.  Small hiatal hernia.  CT ABDOMEN AND PELVIS  Findings:  Normal liver, spleen, distal stomach, pancreas, gallbladder, biliary tract, adrenal glands, left kidney.  Indeterminate upper pole right renal lesion of 1.9 cm.  Infiltrative irregular mass arising from the interpolar right kidney which measures 14.1 x 12.2 cm on image 76/series 2.  12.0 cm on coronal image 49.  Tumor extends towards and may involve the right renal vein, including  on image 67/series 2.  There is extensive neovascularity.  Aortic atherosclerosis.  11 mm left periaortic node on image 74.  Normal colon and terminal ileum.  Normal small bowel without abdominal ascites.    No pelvic adenopathy.  Normal urinary bladder.  Mild prostatomegaly.  Probable bone islands in the right pelvis.  Minimal wedging of the superior endplate of L1, without canal compromise.  Disc bulge at L4-L5.  IMPRESSION:  1.  Infiltrative right renal mass, most consistent with renal cell carcinoma.  Extension towards and probable involvement of the right renal vein. 2.  Retroperitoneal adenopathy, suspicious for nodal metastasis. 3.  Indeterminate upper pole right renal lesion, of doubtful clinical significance. 4.  Prostatomegaly.   Original Report Authenticated By: Jeronimo Greaves, M.D.    CBC  Recent  Labs Lab 03/28/13 1904 03/29/13 0700  WBC 10.7* 6.9  HGB 12.0* 12.0*  HCT 34.2* 35.7*  PLT 210 181  MCV 84.4 84.4  MCH 29.6 28.4  MCHC 35.1 33.6  RDW 13.7 13.8  LYMPHSABS 0.9 0.7  MONOABS 0.8 0.1  EOSABS 0.1 0.0  BASOSABS 0.0 0.0    Chemistries   Recent Labs Lab 03/28/13 1904 03/29/13 0700 03/29/13 1745  NA 137 138 134*  K 4.4 4.1 4.6  CL 102 104 99  CO2 25 22 25   GLUCOSE 241* 268* 471*  BUN 27* 23 29*  CREATININE 0.87 0.75 1.08  CALCIUM 10.7* 10.3 10.1  AST 17 23  --   ALT 19 17  --   ALKPHOS 96 95  --   BILITOT 0.3 0.4  --    ------------------------------------------------------------------------------------------------------------------ estimated creatinine clearance is 84.8 ml/min (by C-G formula based on Cr of 1.08). ------------------------------------------------------------------------------------------------------------------ No results found for this basename: HGBA1C,  in the last 72 hours ------------------------------------------------------------------------------------------------------------------ No results found for this basename: CHOL, HDL, LDLCALC, TRIG, CHOLHDL, LDLDIRECT,  in the last 72 hours ------------------------------------------------------------------------------------------------------------------ No results found for this basename: TSH, T4TOTAL, FREET3, T3FREE, THYROIDAB,  in the last 72 hours ------------------------------------------------------------------------------------------------------------------ No results found for this basename: VITAMINB12, FOLATE, FERRITIN, TIBC, IRON, RETICCTPCT,  in the last 72 hours  Coagulation profile No results found for this basename: INR, PROTIME,  in the last 168 hours  No results found for this basename: DDIMER,  in the last 72 hours  Cardiac Enzymes  Recent Labs Lab 03/29/13 0130 03/29/13 0700 03/29/13 1429  TROPONINI 1.68* 1.32* 0.95*    ------------------------------------------------------------------------------------------------------------------ No components found with this basename: POCBNP,

## 2013-04-03 MED ORDER — DEXAMETHASONE 4 MG PO TABS
4.0000 mg | ORAL_TABLET | Freq: Three times a day (TID) | ORAL | Status: DC
  Administered 2013-04-03 – 2013-04-05 (×7): 4 mg via ORAL
  Filled 2013-04-03 (×9): qty 1

## 2013-04-03 MED ORDER — INSULIN GLARGINE 100 UNIT/ML ~~LOC~~ SOLN
60.0000 [IU] | Freq: Two times a day (BID) | SUBCUTANEOUS | Status: DC
  Administered 2013-04-03 – 2013-04-04 (×3): 60 [IU] via SUBCUTANEOUS
  Filled 2013-04-03 (×6): qty 0.6

## 2013-04-03 MED ORDER — INSULIN ASPART 100 UNIT/ML ~~LOC~~ SOLN
8.0000 [IU] | Freq: Three times a day (TID) | SUBCUTANEOUS | Status: DC
  Administered 2013-04-03 – 2013-04-05 (×6): 8 [IU] via SUBCUTANEOUS

## 2013-04-03 MED ORDER — HYDROCODONE-ACETAMINOPHEN 5-325 MG PO TABS: 1.0000 | ORAL_TABLET | Freq: Four times a day (QID) | ORAL | Status: DC | PRN

## 2013-04-03 MED ORDER — INSULIN GLARGINE 100 UNIT/ML ~~LOC~~ SOLN: 65.0000 [IU] | Freq: Two times a day (BID) | SUBCUTANEOUS | Status: DC

## 2013-04-03 NOTE — Progress Notes (Signed)
[facility]                                                                                Patient Demographics  George [name], is a 67 y.o. male, DOB - [birth year], ZOX:[id number], WJX:[id number]  Admit date - 03/28/2013  Admitting Physician Eduard Clos, MD  Outpatient Primary MD for the patient is No primary provider on file.  LOS - 6   Chief Complaint  Patient presents with  . Altered Mental Status  . Fall        Assessment & Plan    Brief narrative:   67 y.o. male w/ a history of CAD status post stent, diabetes mellitus type 2, and hyperlipidemia who was found on the floor in his house by his wife that around 6 PM. Patient was found to be initially confused but was arousable. Patient had incontinence of urine. Later patient was able to stand up. Did not have any focal deficits. The last seen normal was on 2 PM. Patient was brought to the ER. CT head showed features concerning for mass versus stroke. While in the ER the patient was seen to have a focal seizure with right eye deviation and tonic clonic activity.   Neurologist on-call Dr. Amada Jupiter evaluated the patient and the patient was started on Keppra and Decadron for possible seizures and brain mass. Family further described a several day history of difficulty finding his words, and forgetting what he was going to say. Additionally, patient's troponin was found to be elevated with EKG showing no ST elevations. On-call Cardiologist was consulted by ER physician. Patient did have some chest discomfort the week prior to his admit, and was originally scheduled for cardiac catheter the day of his admission.   MRI of the brain reveals mets to the brain. Further CT imaging ordered to find primary.  Pt has been found to have a right renal mass and metastasis to the brain and lungs- IR will biopsy renal mass.Urology consulted as well but no need for nephrectomy at this time.    Note patient was offered to go home and  get kidney biopsy as outpatient, however he decided against it on 04/03/2013. Had detailed discussions with interventional radiologist on call both physician and PA, early as outpatient biopsy could be scheduled on coming Wednesday which patient chose not to do. He wishes to get his biopsy done as soon as possible, in the inpatient setting he scheduled for the biopsy on the coming Tuesday.    Assessment/Plan:   Seizure  - found unresponsive by wife and suspected to be due to seizure  -seized in ER  - placed on Keppra for now  - EEG reveals "mild generalized slowing of cerebral activity which was slightly more prominent involving the left hemisphere compared to the right, consistent with patient's history of probable left cerebral mass lesion."  - likely due to below     Multiple brain mets - largest lesion in Frontal lobe with mild midline shift and mild hemorrhage  - difficulty with word finding per wife and patient, which has reportedly been progressive for weeks (neighbors noted prior to family in retrospect)  - Decadron started.  Real mass with pulmonary mets  -right kidney is likely the origin of the cancer- pt having right flank pain- discussed with Urology- Dr Isabel Caprice has seen him - no need for nephrectomy unless pt symptomatic with pain- see his note  - after discussion with pat, will try Vicodin for flank pain- he states it is effective- watch for constipation  - IR consulted for biopsy which is scheduled for 04/05/2013, -  will call Onc consult once tissue diagnosis obtained as outpt     Nonspecific troponin elevation  - evaluated by [facility]- no further plans for cath for now  - was due for a cath but due to above finding, cath has been postponed (cannot give blood thinners)  - was taking ASA - will not resume this for now  Continue on statin we'll add low-dose beta blocker   Diabetes mellitus  - takes lantus 60 U at bedtime , will add pre-meal NovoLog and  continue sliding scale insulin for now - CBG poorly controlled due to steroids - adjusting tx - follow   Lab Results  Component Value Date   HGBA1C 7.4* 03/29/2013    CBG (last 3)   Recent Labs  04/02/13 1637 04/02/13 2113 04/03/13 0716  GLUCAP 252* 332* 200*      Hyperlipidemia  - takes fish oil at home, on statin here     Code Status: Full  Family Communication: None present  Disposition Plan: Home   Procedures CT chest abdomen, CT neck, MR brain, IR guided renal biopsy scheduled for 04-05-2013   Consults  IR, Neuro, cards, urology   DVT Prophylaxis    SCDs   Lab Results  Component Value Date   PLT 181 03/29/2013    Medications  Scheduled Meds: . dexamethasone  4 mg Oral Q6H  . docusate sodium  100 mg Oral QHS,MR X 1  . insulin aspart  0-20 Units Subcutaneous TID WC  . insulin aspart  0-5 Units Subcutaneous QHS  . insulin aspart  5 Units Subcutaneous TID WC  . insulin glargine  50 Units Subcutaneous BID  . levETIRAcetam  500 mg Oral BID  . metoprolol tartrate  25 mg Oral BID  . omega-3 acid ethyl esters  1 g Oral BID  . pantoprazole  40 mg Oral Q1200  . psyllium  1 packet Oral Daily  . simvastatin  80 mg Oral QHS   Continuous Infusions: . sodium chloride 50 mL/hr at 04/03/13 0000   PRN Meds:.HYDROcodone-acetaminophen, LORazepam, senna-docusate  Antibiotics     Anti-infectives   None       Time Spent in minutes   35   Susa Raring K M.D on 04/03/2013 at 10:39 AM  Between 7am to 7pm - Pager - [phone]  After 7pm go to [email or web address] - password [email or web address]  And look for the night coverage person covering for me after hours  [facility] Office  [phone]    Subjective:   George [name] today has, No headache, No chest pain, No abdominal pain - No Nausea, No new weakness tingling or numbness, No Cough - SOB.    Objective:   Filed Vitals:   04/02/13 2119 04/03/13 0542 04/03/13 0938 04/03/13 1038  BP: 159/68 111/71  94/33 140/60  Pulse: 62 62 59 59  Temp: 97.6 F (36.4 C) 98.1 F (36.7 C) 98 F (36.7 C)   TempSrc: Oral Oral    Resp: 18 18 18    Height: 6\' 2"  (1.88 m)     Weight: 100.557  kg (221 lb 11 oz)     SpO2: 98% 95% 96%     Wt Readings from Last 3 Encounters:  04/02/13 100.557 kg (221 lb 11 oz)  04/02/13 100.557 kg (221 lb 11 oz)  04/02/13 100.557 kg (221 lb 11 oz)     Intake/Output Summary (Last 24 hours) at 04/03/13 1039 Last data filed at 04/03/13 0865  Gross per 24 hour  Intake 1505.83 ml  Output      0 ml  Net 1505.83 ml    Exam Awake Alert, Oriented X 3, No new F.N deficits, Normal affect [place].AT,PERRAL Supple Neck,No JVD, No cervical lymphadenopathy appriciated.  Symmetrical Chest wall movement, Good air movement bilaterally, CTAB RRR,No Gallops,Rubs or new Murmurs, No Parasternal Heave +ve B.Sounds, Abd Soft, Non tender, No organomegaly appriciated, No rebound - guarding or rigidity. No Cyanosis, Clubbing or edema, No new Rash or bruise      Data Review   Micro Results Recent Results (from the past 240 hour(s))  MRSA PCR SCREENING     Status: None   Collection Time    03/29/13  1:10 AM      Result Value Range Status   MRSA by PCR NEGATIVE  NEGATIVE Final   Comment:            The GeneXpert MRSA Assay (FDA     approved for NASAL specimens     only), is one component of a     comprehensive MRSA colonization     surveillance program. It is not     intended to diagnose MRSA     infection nor to guide or     monitor treatment for     MRSA infections.    Radiology Reports Dg Chest 2 View  03/28/2013   *RADIOLOGY REPORT*  Clinical Data: Altered mental status, fall  CHEST - 2 VIEW  Comparison: None.  Findings: Cardiac leads overlie the chest.  Heart size is normal. Lung volumes are low but clear.  No pleural effusion.  No acute osseous finding. Mild central bronchial wall thickening noted.  IMPRESSION: No focal pulmonary opacity.  Mild central bronchial wall  thickening which may indicate reactive airways disease or bronchitis.   Original Report Authenticated By: Conchita Paris, M.D.   Ct Head Wo Contrast  03/28/2013   *RADIOLOGY REPORT*  Clinical Data:  Altered mental status and fall  CT HEAD WITHOUT CONTRAST CT CERVICAL SPINE WITHOUT CONTRAST  Technique:  Multidetector CT imaging of the head and cervical spine was performed following the standard protocol without intravenous contrast.  Multiplanar CT image reconstructions of the cervical spine were also generated.  Comparison:  CT 03/26/1999 is not digitized.  Report is reviewed.  CT HEAD  Findings: There is a large area of abnormal hypo attenuation within the gray matter and extending to the cortical surface in the left frontoparietal lobe region.  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  No acute hemorrhage is seen.  No sulcal effacement.  No midline shift.  No skull fracture. Orbits and paranasal sinuses are intact.  IMPRESSION: Abnormal area of predominately white matter left frontoparietal lobe hypodensity, which may indicate acute versus subacute infarct. Infiltrative neoplasm such as glioblastoma multiforme is possible but considered less likely given absence of focal effacement.  No acute hemorrhage.  CT CERVICAL SPINE  Findings: C1 through the cervical thoracic junction is visualized in its entirety. No precervical soft tissue widening is present. No fracture or dislocation.  Alignment is normal.  IMPRESSION:  No acute cervical spine abnormality.  Critical Value/emergent results were called by telephone at the time of interpretation on 03/28/2013 at 8:25 p.m. to Dr. Regenia Skeeter, who verbally acknowledged these results.   Original Report Authenticated By: Conchita Paris, M.D.   Ct Chest W Contrast  03/31/2013   *RADIOLOGY REPORT*  Clinical Data:  Metastasis to the brain.  History of smoking. Evaluate for lung cancer.  CT CHEST, ABDOMEN AND PELVIS WITH CONTRAST  Technique:  Contiguous axial images of the chest abdomen and pelvis were obtained after IV contrast administration.  Contrast: 100  ml Omnipaque-300  Comparison: Plain film chest 03/28/2013.  No prior CTs.  CT CHEST  Findings: Lung windows demonstrate probable secretions within the trachea.  Mild centrilobular emphysema.  Multiple bilateral pulmonary nodules, most consistent with metastatic disease.  Index right upper lobe lung nodule measures 1.2 cm on image 17/series 3. Index right lower lobe lung nodule measures 2.1 cm on image 34/series 3. Index left upper lobe nodule measures 1.1 cm on image 12/series 3.  Soft tissue windows demonstrate no supraclavicular adenopathy. Aortic and coronary artery atherosclerosis.  Heart size upper normal, without pericardial effusion.  No pleural fluid. No central pulmonary embolism, on this non-dedicated study.  Left paratracheal 1.7 cm node on image 19/series 2. 1.6 cm subcarinal node on image 26/series 2. Bovine arch.  Small hiatal hernia.  IMPRESSION:  1.  Extensive pulmonary metastasis. 2.  Mediastinal adenopathy, suspicious for nodal metastasis. 3.  Atherosclerosis, including within the coronary arteries. 4.  Small hiatal hernia.  CT ABDOMEN AND PELVIS  Findings:  Normal liver, spleen, distal stomach, pancreas, gallbladder, biliary tract, adrenal glands, left kidney.  Indeterminate upper pole right renal lesion of 1.9 cm.  Infiltrative irregular mass arising from the interpolar right kidney which measures 14.1 x 12.2 cm on image 76/series 2.  12.0 cm on coronal image 49.  Tumor extends towards and may involve the right renal vein, including on image 67/series 2.  There is extensive neovascularity.  Aortic atherosclerosis.  11 mm left periaortic node on image 74.  Normal colon and terminal ileum.  Normal small bowel without abdominal ascites.    No pelvic adenopathy.  Normal urinary bladder.  Mild prostatomegaly.  Probable bone islands in the right pelvis.  Minimal wedging of the  superior endplate of L1, without canal compromise.  Disc bulge at L4-L5.  IMPRESSION:  1.  Infiltrative right renal mass, most consistent with renal cell carcinoma.  Extension towards and probable involvement of the right renal vein. 2.  Retroperitoneal adenopathy, suspicious for nodal metastasis. 3.  Indeterminate upper pole right renal lesion, of doubtful clinical significance. 4.  Prostatomegaly.   Original Report Authenticated By: Abigail Miyamoto, M.D.   Ct Cervical Spine Wo Contrast  03/28/2013   *RADIOLOGY REPORT*  Clinical Data:  Altered mental status and fall  CT HEAD WITHOUT CONTRAST CT CERVICAL SPINE WITHOUT CONTRAST  Technique:  Multidetector CT imaging of the head and cervical spine was performed following the standard protocol without intravenous contrast.  Multiplanar CT image reconstructions of the cervical spine were also generated.  Comparison:  CT 03/26/1999 is not digitized.  Report is reviewed.  CT HEAD  Findings: There is a large area of abnormal hypo attenuation within the gray matter and extending to the cortical surface in the left frontoparietal lobe region.  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  No acute hemorrhage is seen.  No sulcal effacement.  No midline shift.  No skull fracture. Orbits and  paranasal sinuses are intact.  IMPRESSION: Abnormal area of predominately white matter left frontoparietal lobe hypodensity, which may indicate acute versus subacute infarct. Infiltrative neoplasm such as glioblastoma multiforme is possible but considered less likely given absence of focal effacement.  No acute hemorrhage.  CT CERVICAL SPINE  Findings: C1 through the cervical thoracic junction is visualized in its entirety. No precervical soft tissue widening is present. No fracture or dislocation.  Alignment is normal.  IMPRESSION: No acute cervical spine abnormality.  Critical Value/emergent results were called by telephone at the time of interpretation on  03/28/2013 at 8:25 p.m. to Dr. Regenia Skeeter, who verbally acknowledged these results.   Original Report Authenticated By: Conchita Paris, M.D.   Mr Jeri Cos Wo Contrast  03/30/2013   *RADIOLOGY REPORT*  Clinical Data: Seizure  MRI HEAD WITHOUT AND WITH CONTRAST  Technique:  Multiplanar, multiecho pulse sequences of the brain and surrounding structures were obtained according to standard protocol without and with intravenous contrast  Contrast: 27mL MULTIHANCE GADOBENATE DIMEGLUMINE 529 MG/ML IV SOLN  Comparison: CT 03/28/2013  Findings: Multiple enhancing mass lesions are present compatible with metastatic disease.  The largest lesion in the left frontal lobe measures 16 x 16 mm and has a large amount of surrounding white matter edema. Minimal associated hemorrhage.  4 mm enhancing lesion is seen posterior to this lesion.  8 x 17 mm periventricular lesion on the left. 5 mm enhancing lesion right parietal lobe with mild edema 8 x 12 mm left cerebellar lesion. Mild cerebellar edema on the left. 5 mm left cerebellar lesion 6 x 10 mm left posterior inferior cerebellar lesion.  No significant mass effect on the fourth ventricle is present.  Ventricle size is normal.  Mild atrophy is present.  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  2 mm midline shift to the right.  Negative for acute infarct.  There is a small amount of restricted diffusion in the left periventricular white matter lesion and a small amount of restricted diffusion in the larger left frontal lesion.  IMPRESSION: Multiple enhancing lesions in the brain compatible with metastatic disease.  There is a large amount of white matter edema in the left frontal lobe with 3 enhancing masses in the left frontal lobe. Mild midline shift 2 mm.   Original Report Authenticated By: Carl Best, M.D.   Ct Abdomen Pelvis W Contrast  03/31/2013   *RADIOLOGY REPORT*  Clinical Data:  Metastasis to the brain.  History of smoking. Evaluate for lung  cancer.  CT CHEST, ABDOMEN AND PELVIS WITH CONTRAST  Technique: Contiguous axial images of the chest abdomen and pelvis were obtained after IV contrast administration.  Contrast: 100  ml Omnipaque-300  Comparison: Plain film chest 03/28/2013.  No prior CTs.  CT CHEST  Findings: Lung windows demonstrate probable secretions within the trachea.  Mild centrilobular emphysema.  Multiple bilateral pulmonary nodules, most consistent with metastatic disease.  Index right upper lobe lung nodule measures 1.2 cm on image 17/series 3. Index right lower lobe lung nodule measures 2.1 cm on image 34/series 3. Index left upper lobe nodule measures 1.1 cm on image 12/series 3.  Soft tissue windows demonstrate no supraclavicular adenopathy. Aortic and coronary artery atherosclerosis.  Heart size upper normal, without pericardial effusion.  No pleural fluid. No central pulmonary embolism, on this non-dedicated study.  Left paratracheal 1.7 cm node on image 19/series 2. 1.6 cm subcarinal node on image 26/series 2. Bovine arch.  Small hiatal hernia.  IMPRESSION:  1.  Extensive pulmonary metastasis. 2.  Mediastinal adenopathy, suspicious for nodal metastasis. 3.  Atherosclerosis, including within the coronary arteries. 4.  Small hiatal hernia.  CT ABDOMEN AND PELVIS  Findings:  Normal liver, spleen, distal stomach, pancreas, gallbladder, biliary tract, adrenal glands, left kidney.  Indeterminate upper pole right renal lesion of 1.9 cm.  Infiltrative irregular mass arising from the interpolar right kidney which measures 14.1 x 12.2 cm on image 76/series 2.  12.0 cm on coronal image 49.  Tumor extends towards and may involve the right renal vein, including on image 67/series 2.  There is extensive neovascularity.  Aortic atherosclerosis.  11 mm left periaortic node on image 74.  Normal colon and terminal ileum.  Normal small bowel without abdominal ascites.    No pelvic adenopathy.  Normal urinary bladder.  Mild prostatomegaly.  Probable  bone islands in the right pelvis.  Minimal wedging of the superior endplate of L1, without canal compromise.  Disc bulge at L4-L5.  IMPRESSION:  1.  Infiltrative right renal mass, most consistent with renal cell carcinoma.  Extension towards and probable involvement of the right renal vein. 2.  Retroperitoneal adenopathy, suspicious for nodal metastasis. 3.  Indeterminate upper pole right renal lesion, of doubtful clinical significance. 4.  Prostatomegaly.   Original Report Authenticated By: Jeronimo Greaves, M.D.    CBC  Recent Labs Lab 03/28/13 1904 03/29/13 0700  WBC 10.7* 6.9  HGB 12.0* 12.0*  HCT 34.2* 35.7*  PLT 210 181  MCV 84.4 84.4  MCH 29.6 28.4  MCHC 35.1 33.6  RDW 13.7 13.8  LYMPHSABS 0.9 0.7  MONOABS 0.8 0.1  EOSABS 0.1 0.0  BASOSABS 0.0 0.0    Chemistries   Recent Labs Lab 03/28/13 1904 03/29/13 0700 03/29/13 1745  NA 137 138 134*  K 4.4 4.1 4.6  CL 102 104 99  CO2 25 22 25   GLUCOSE 241* 268* 471*  BUN 27* 23 29*  CREATININE 0.87 0.75 1.08  CALCIUM 10.7* 10.3 10.1  AST 17 23  --   ALT 19 17  --   ALKPHOS 96 95  --   BILITOT 0.3 0.4  --    ------------------------------------------------------------------------------------------------------------------ estimated creatinine clearance is 85.3 ml/min (by C-G formula based on Cr of 1.08). ------------------------------------------------------------------------------------------------------------------ No results found for this basename: HGBA1C,  in the last 72 hours ------------------------------------------------------------------------------------------------------------------ No results found for this basename: CHOL, HDL, LDLCALC, TRIG, CHOLHDL, LDLDIRECT,  in the last 72 hours ------------------------------------------------------------------------------------------------------------------ No results found for this basename: TSH, T4TOTAL, FREET3, T3FREE, THYROIDAB,  in the last 72  hours ------------------------------------------------------------------------------------------------------------------ No results found for this basename: VITAMINB12, FOLATE, FERRITIN, TIBC, IRON, RETICCTPCT,  in the last 72 hours  Coagulation profile No results found for this basename: INR, PROTIME,  in the last 168 hours  No results found for this basename: DDIMER,  in the last 72 hours  Cardiac Enzymes  Recent Labs Lab 03/29/13 0130 03/29/13 0700 03/29/13 1429  TROPONINI 1.68* 1.32* 0.95*   ------------------------------------------------------------------------------------------------------------------ No components found with this basename: POCBNP,

## 2013-04-04 LAB — GLUCOSE, CAPILLARY
Glucose-Capillary: 110 mg/dL — ABNORMAL HIGH (ref 70–99)
Glucose-Capillary: 208 mg/dL — ABNORMAL HIGH (ref 70–99)
Glucose-Capillary: 192 mg/dL — ABNORMAL HIGH (ref 70–99)

## 2013-04-04 MED ORDER — METOPROLOL TARTRATE 12.5 MG HALF TABLET
12.5000 mg | ORAL_TABLET | Freq: Two times a day (BID) | ORAL | Status: DC
  Filled 2013-04-04 (×4): qty 1

## 2013-04-04 MED ORDER — MAGIC MOUTHWASH W/LIDOCAINE
5.0000 mL | Freq: Four times a day (QID) | ORAL | Status: DC | PRN
  Filled 2013-04-04: qty 5

## 2013-04-04 NOTE — Progress Notes (Signed)
Occupational Therapy Treatment and discharge note Patient Details Name: George [name] MRN: [id number] DOB: 09-05-1945 Today's Date: 04/04/2013 Time: 4098-1191 OT Time Calculation (min): 19 min  OT Assessment / Plan / Recommendation  History of present illness Pt admitted after being found down of floor by wife.  Pt with seizure activity in ER.  Scan showed CVA vs mass.  MRI with contrast scheduled.   OT comments  Pt is performing ADL and mobility at a modified independent level.  Demonstrated appropriate level of safety awareness.  No further OT needs.  Follow Up Recommendations  No OT follow up    Barriers to Discharge       Equipment Recommendations  None recommended by OT    Recommendations for Other Services    Frequency     Progress towards OT Goals Progress towards OT goals: Goals met/education completed, patient discharged from OT  Plan Discharge plan needs to be updated    Precautions / Restrictions Precautions Precautions: None Precaution Comments: No impulsivity noted Restrictions Weight Bearing Restrictions: No   Pertinent Vitals/Pain VSS, no pain    ADL  Eating/Feeding: Independent Where Assessed - Eating/Feeding: Edge of bed Grooming: Shaving;Independent;Wash/dry hands;Wash/dry face Where Assessed - Grooming: Unsupported standing Upper Body Bathing: Independent Where Assessed - Upper Body Bathing: Unsupported standing Lower Body Bathing: Modified independent Where Assessed - Lower Body Bathing: Unsupported standing Upper Body Dressing: Independent Where Assessed - Upper Body Dressing: Unsupported sitting Lower Body Dressing: Independent Where Assessed - Lower Body Dressing: Unsupported sitting;Unsupported standing Toilet Transfer: Modified independent Toilet Transfer Method: Sit to Barista: Comfort height toilet Toileting - Clothing Manipulation and Hygiene: Independent Where Assessed - Engineer, mining and  Hygiene: Sit on 3-in-1 or toilet Tub/Shower Transfer: Modified independent Tub/Shower Transfer Method: Ambulating Transfers/Ambulation Related to ADLs: mod I, takes his time ADL Comments: Pt has been showering independently.    OT Diagnosis:    OT Problem List:   OT Treatment Interventions:     OT Goals(current goals can now be found in the care plan section) Acute Rehab OT Goals Patient Stated Goal: to get on out of here. OT Goal Formulation: With patient/family Time For Goal Achievement: 04/12/13 Potential to Achieve Goals: Good ADL Goals Pt Will Perform Grooming: with modified independence;standing Pt Will Perform Lower Body Dressing: with modified independence;sit to/from stand Pt Will Perform Tub/Shower Transfer: with supervision;ambulating Additional ADL Goal #1: Pt will toilet with mod I on 3:1 over commode. Additional ADL Goal #2: Pt will complete simple money mangement tasks with S.  Visit Information  Last OT Received On: 04/04/13 History of Present Illness: Pt admitted after being found down of floor by wife.  Pt with seizure activity in ER.  Scan showed CVA vs mass.  MRI with contrast scheduled.    Subjective Data      Prior Functioning       Cognition  Cognition Arousal/Alertness: Awake/alert Behavior During Therapy: WFL for tasks assessed/performed Area of Impairment: Memory Memory: Decreased short-term memory General Comments: good safety awareness this visit    Mobility  Bed Mobility Bed Mobility: Supine to Sit;Sit to Supine Supine to Sit: 6: Modified independent (Device/Increase time) Sitting - Scoot to Edge of Bed: 7: Independent Sit to Supine: 6: Modified independent (Device/Increase time) Transfers Sit to Stand: 6: Modified independent (Device/Increase time) Stand to Sit: 6: Modified independent (Device/Increase time)    Exercises      Balance     End of Session OT - End of Session  Activity Tolerance: Patient tolerated treatment  well Patient left: in bed;with call bell/phone within reach;with family/visitor present  GO     Evern Bio 04/04/2013, 11:37 AM [phone]

## 2013-04-04 NOTE — Progress Notes (Signed)
[facility]                                                                                Patient Demographics  George [name], is a 67 y.o. male, DOB - 10/10/1945, ZOX:[id number], WJX:[id number]  Admit date - 03/28/2013  Admitting Physician Eduard Clos, MD  Outpatient Primary MD for the patient is No primary provider on file.  LOS - 7   Chief Complaint  Patient presents with  . Altered Mental Status  . Fall        Assessment & Plan    Brief narrative:   67 y.o. male w/ a history of CAD status post stent, diabetes mellitus type 2, and hyperlipidemia who was found on the floor in his house by his wife that around 6 PM. Patient was found to be initially confused but was arousable. Patient had incontinence of urine. Later patient was able to stand up. Did not have any focal deficits. The last seen normal was on 2 PM. Patient was brought to the ER. CT head showed features concerning for mass versus stroke. While in the ER the patient was seen to have a focal seizure with right eye deviation and tonic clonic activity.   Neurologist on-call Dr. Amada Jupiter evaluated the patient and the patient was started on Keppra and Decadron for possible seizures and brain mass. Family further described a several day history of difficulty finding his words, and forgetting what he was going to say. Additionally, patient's troponin was found to be elevated with EKG showing no ST elevations. On-call Cardiologist was consulted by ER physician. Patient did have some chest discomfort the week prior to his admit, and was originally scheduled for cardiac catheter the day of his admission.   MRI of the brain reveals mets to the brain. Further CT imaging ordered to find primary.  Pt has been found to have a right renal mass and metastasis to the brain and lungs- IR will biopsy renal mass.Urology consulted as well but no need for nephrectomy at this time.     Assessment/Plan:   Seizure  - found  unresponsive by wife and suspected to be due to seizure  -seized in ER  - placed on Keppra for now  - EEG reveals "mild generalized slowing of cerebral activity which was slightly more prominent involving the left hemisphere compared to the right, consistent with patient's history of probable left cerebral mass lesion."  - likely due to below     Multiple brain mets - largest lesion in Frontal lobe with mild midline shift and mild hemorrhage  - difficulty with word finding per wife and patient, which has reportedly been progressive for weeks (neighbors noted prior to family in retrospect)  - Decadron started.    Real mass with pulmonary mets  -right kidney is likely the origin of the cancer- pt having right flank pain- discussed with Urology- Dr Isabel Caprice has seen him - no need for nephrectomy unless pt symptomatic with pain- see his note  - after discussion with pat, will try Vicodin for flank pain- he states it is effective- watch for constipation  - IR consulted for biopsy which is scheduled for 04/05/2013, -  will call Onc consult once tissue diagnosis obtained or outpt follow up.     Nonspecific troponin elevation  - evaluated by [facility]- no further plans for cath for now  - was due for a cath but due to above finding, cath has been postponed (cannot give blood thinners)  - was taking ASA - will not resume this for now  Continue on statin we'll add low-dose beta blocker     Diabetes mellitus  - takes lantus 60 U at bedtime , will add pre-meal NovoLog and continue sliding scale insulin for now - CBG poorly controlled due to steroids - adjusting tx - follow   Lab Results  Component Value Date   HGBA1C 7.4* 03/29/2013    CBG (last 3)   Recent Labs  04/03/13 1620 04/03/13 2158 04/04/13 0830  GLUCAP 203* 198* 110*       Hyperlipidemia  - takes fish oil at home, on statin here     Code Status: Full  Family Communication: None present  Disposition  Plan: Home   Procedures CT chest abdomen, CT neck, MR brain, IR guided renal biopsy scheduled for 04-05-2013   Consults  IR, Neuro, cards, urology   DVT Prophylaxis    SCDs   Lab Results  Component Value Date   PLT 181 03/29/2013    Medications  Scheduled Meds: . dexamethasone  4 mg Oral Q8H  . docusate sodium  100 mg Oral QHS,MR X 1  . insulin aspart  0-20 Units Subcutaneous TID WC  . insulin aspart  0-5 Units Subcutaneous QHS  . insulin aspart  8 Units Subcutaneous TID WC  . insulin glargine  60 Units Subcutaneous BID  . levETIRAcetam  500 mg Oral BID  . metoprolol tartrate  12.5 mg Oral BID  . omega-3 acid ethyl esters  1 g Oral BID  . pantoprazole  40 mg Oral Q1200  . psyllium  1 packet Oral Daily  . simvastatin  80 mg Oral QHS   Continuous Infusions: . sodium chloride 50 mL/hr at 04/03/13 0000   PRN Meds:.HYDROcodone-acetaminophen, LORazepam, senna-docusate  Antibiotics     Anti-infectives   None       Time Spent in minutes   35   Lala Lund K M.D on 04/04/2013 at 10:13 AM  Between 7am to 7pm - Pager - [phone]  After 7pm go to [email or web address] - password [email or web address]  And look for the night coverage person covering for me after hours  [facility] Office  [phone]    Subjective:   George [name] today has, No headache, No chest pain, No abdominal pain - No Nausea, No new weakness tingling or numbness, No Cough - SOB.    Objective:   Filed Vitals:   04/03/13 1619 04/03/13 2153 04/04/13 0508 04/04/13 0933  BP: 125/48 159/26 148/57 139/46  Pulse: 48 51 49 53  Temp: 97.6 F (36.4 C) 97.9 F (36.6 C) 97.9 F (36.6 C) 97.2 F (36.2 C)  TempSrc:   Oral Oral  Resp: 19 18 19 18   Height:      Weight:      SpO2: 99% 100% 97% 99%    Wt Readings from Last 3 Encounters:  04/02/13 100.557 kg (221 lb 11 oz)  04/02/13 100.557 kg (221 lb 11 oz)  04/02/13 100.557 kg (221 lb 11 oz)     Intake/Output Summary (Last 24 hours) at 04/04/13  1013 Last data filed at 04/04/13 0900  Gross per 24 hour  Intake  560 ml  Output      0 ml  Net    560 ml    Exam Awake Alert, Oriented X 3, No new F.N deficits, Normal affect [place].AT,PERRAL Supple Neck,No JVD, No cervical lymphadenopathy appriciated.  Symmetrical Chest wall movement, Good air movement bilaterally, CTAB RRR,No Gallops,Rubs or new Murmurs, No Parasternal Heave +ve B.Sounds, Abd Soft, Non tender, No organomegaly appriciated, No rebound - guarding or rigidity. No Cyanosis, Clubbing or edema, No new Rash or bruise      Data Review   Micro Results Recent Results (from the past 240 hour(s))  MRSA PCR SCREENING     Status: None   Collection Time    03/29/13  1:10 AM      Result Value Range Status   MRSA by PCR NEGATIVE  NEGATIVE Final   Comment:            The GeneXpert MRSA Assay (FDA     approved for NASAL specimens     only), is one component of a     comprehensive MRSA colonization     surveillance program. It is not     intended to diagnose MRSA     infection nor to guide or     monitor treatment for     MRSA infections.    Radiology Reports Dg Chest 2 View  03/28/2013   *RADIOLOGY REPORT*  Clinical Data: Altered mental status, fall  CHEST - 2 VIEW  Comparison: None.  Findings: Cardiac leads overlie the chest.  Heart size is normal. Lung volumes are low but clear.  No pleural effusion.  No acute osseous finding. Mild central bronchial wall thickening noted.  IMPRESSION: No focal pulmonary opacity.  Mild central bronchial wall thickening which may indicate reactive airways disease or bronchitis.   Original Report Authenticated By: Conchita Paris, M.D.   Ct Head Wo Contrast  03/28/2013   *RADIOLOGY REPORT*  Clinical Data:  Altered mental status and fall  CT HEAD WITHOUT CONTRAST CT CERVICAL SPINE WITHOUT CONTRAST  Technique:  Multidetector CT imaging of the head and cervical spine was performed following the standard protocol without intravenous contrast.   Multiplanar CT image reconstructions of the cervical spine were also generated.  Comparison:  CT 03/26/1999 is not digitized.  Report is reviewed.  CT HEAD  Findings: There is a large area of abnormal hypo attenuation within the gray matter and extending to the cortical surface in the left frontoparietal lobe region.  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  No acute hemorrhage is seen.  No sulcal effacement.  No midline shift.  No skull fracture. Orbits and paranasal sinuses are intact.  IMPRESSION: Abnormal area of predominately white matter left frontoparietal lobe hypodensity, which may indicate acute versus subacute infarct. Infiltrative neoplasm such as glioblastoma multiforme is possible but considered less likely given absence of focal effacement.  No acute hemorrhage.  CT CERVICAL SPINE  Findings: C1 through the cervical thoracic junction is visualized in its entirety. No precervical soft tissue widening is present. No fracture or dislocation.  Alignment is normal.  IMPRESSION: No acute cervical spine abnormality.  Critical Value/emergent results were called by telephone at the time of interpretation on 03/28/2013 at 8:25 p.m. to Dr. Regenia Skeeter, who verbally acknowledged these results.   Original Report Authenticated By: Conchita Paris, M.D.   Ct Chest W Contrast  03/31/2013   *RADIOLOGY REPORT*  Clinical Data:  Metastasis to the brain.  History of smoking. Evaluate for lung cancer.  CT CHEST, ABDOMEN AND PELVIS WITH CONTRAST  Technique: Contiguous axial images of the chest abdomen and pelvis were obtained after IV contrast administration.  Contrast: 100  ml Omnipaque-300  Comparison: Plain film chest 03/28/2013.  No prior CTs.  CT CHEST  Findings: Lung windows demonstrate probable secretions within the trachea.  Mild centrilobular emphysema.  Multiple bilateral pulmonary nodules, most consistent with metastatic disease.  Index right upper lobe lung nodule measures 1.2  cm on image 17/series 3. Index right lower lobe lung nodule measures 2.1 cm on image 34/series 3. Index left upper lobe nodule measures 1.1 cm on image 12/series 3.  Soft tissue windows demonstrate no supraclavicular adenopathy. Aortic and coronary artery atherosclerosis.  Heart size upper normal, without pericardial effusion.  No pleural fluid. No central pulmonary embolism, on this non-dedicated study.  Left paratracheal 1.7 cm node on image 19/series 2. 1.6 cm subcarinal node on image 26/series 2. Bovine arch.  Small hiatal hernia.  IMPRESSION:  1.  Extensive pulmonary metastasis. 2.  Mediastinal adenopathy, suspicious for nodal metastasis. 3.  Atherosclerosis, including within the coronary arteries. 4.  Small hiatal hernia.  CT ABDOMEN AND PELVIS  Findings:  Normal liver, spleen, distal stomach, pancreas, gallbladder, biliary tract, adrenal glands, left kidney.  Indeterminate upper pole right renal lesion of 1.9 cm.  Infiltrative irregular mass arising from the interpolar right kidney which measures 14.1 x 12.2 cm on image 76/series 2.  12.0 cm on coronal image 49.  Tumor extends towards and may involve the right renal vein, including on image 67/series 2.  There is extensive neovascularity.  Aortic atherosclerosis.  11 mm left periaortic node on image 74.  Normal colon and terminal ileum.  Normal small bowel without abdominal ascites.    No pelvic adenopathy.  Normal urinary bladder.  Mild prostatomegaly.  Probable bone islands in the right pelvis.  Minimal wedging of the superior endplate of L1, without canal compromise.  Disc bulge at L4-L5.  IMPRESSION:  1.  Infiltrative right renal mass, most consistent with renal cell carcinoma.  Extension towards and probable involvement of the right renal vein. 2.  Retroperitoneal adenopathy, suspicious for nodal metastasis. 3.  Indeterminate upper pole right renal lesion, of doubtful clinical significance. 4.  Prostatomegaly.   Original Report Authenticated By: Jeronimo Greaves, M.D.   Ct Cervical Spine Wo Contrast  03/28/2013   *RADIOLOGY REPORT*  Clinical Data:  Altered mental status and fall  CT HEAD WITHOUT CONTRAST CT CERVICAL SPINE WITHOUT CONTRAST  Technique:  Multidetector CT imaging of the head and cervical spine was performed following the standard protocol without intravenous contrast.  Multiplanar CT image reconstructions of the cervical spine were also generated.  Comparison:  CT 03/26/1999 is not digitized.  Report is reviewed.  CT HEAD  Findings: There is a large area of abnormal hypo attenuation within the gray matter and extending to the cortical surface in the left frontoparietal lobe region.  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  No acute hemorrhage is seen.  No sulcal effacement.  No midline shift.  No skull fracture. Orbits and paranasal sinuses are intact.  IMPRESSION: Abnormal area of predominately white matter left frontoparietal lobe hypodensity, which may indicate acute versus subacute infarct. Infiltrative neoplasm such as glioblastoma multiforme is possible but considered less likely given absence of focal effacement.  No acute hemorrhage.  CT CERVICAL SPINE  Findings: C1 through the cervical thoracic junction is visualized in its entirety. No precervical soft tissue widening is present. No  fracture or dislocation.  Alignment is normal.  IMPRESSION: No acute cervical spine abnormality.  Critical Value/emergent results were called by telephone at the time of interpretation on 03/28/2013 at 8:25 p.m. to Dr. Regenia Skeeter, who verbally acknowledged these results.   Original Report Authenticated By: Conchita Paris, M.D.   Mr Jeri Cos Wo Contrast  03/30/2013   *RADIOLOGY REPORT*  Clinical Data: Seizure  MRI HEAD WITHOUT AND WITH CONTRAST  Technique:  Multiplanar, multiecho pulse sequences of the brain and surrounding structures were obtained according to standard protocol without and with intravenous contrast  Contrast:  49mL MULTIHANCE GADOBENATE DIMEGLUMINE 529 MG/ML IV SOLN  Comparison: CT 03/28/2013  Findings: Multiple enhancing mass lesions are present compatible with metastatic disease.  The largest lesion in the left frontal lobe measures 16 x 16 mm and has a large amount of surrounding white matter edema. Minimal associated hemorrhage.  4 mm enhancing lesion is seen posterior to this lesion.  8 x 17 mm periventricular lesion on the left. 5 mm enhancing lesion right parietal lobe with mild edema 8 x 12 mm left cerebellar lesion. Mild cerebellar edema on the left. 5 mm left cerebellar lesion 6 x 10 mm left posterior inferior cerebellar lesion.  No significant mass effect on the fourth ventricle is present.  Ventricle size is normal.  Mild atrophy is present.  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  2 mm midline shift to the right.  Negative for acute infarct.  There is a small amount of restricted diffusion in the left periventricular white matter lesion and a small amount of restricted diffusion in the larger left frontal lesion.  IMPRESSION: Multiple enhancing lesions in the brain compatible with metastatic disease.  There is a large amount of white matter edema in the left frontal lobe with 3 enhancing masses in the left frontal lobe. Mild midline shift 2 mm.   Original Report Authenticated By: Carl Best, M.D.   Ct Abdomen Pelvis W Contrast  03/31/2013   *RADIOLOGY REPORT*  Clinical Data:  Metastasis to the brain.  History of smoking. Evaluate for lung cancer.  CT CHEST, ABDOMEN AND PELVIS WITH CONTRAST  Technique: Contiguous axial images of the chest abdomen and pelvis were obtained after IV contrast administration.  Contrast: 100  ml Omnipaque-300  Comparison: Plain film chest 03/28/2013.  No prior CTs.  CT CHEST  Findings: Lung windows demonstrate probable secretions within the trachea.  Mild centrilobular emphysema.  Multiple bilateral pulmonary nodules, most consistent with metastatic  disease.  Index right upper lobe lung nodule measures 1.2 cm on image 17/series 3. Index right lower lobe lung nodule measures 2.1 cm on image 34/series 3. Index left upper lobe nodule measures 1.1 cm on image 12/series 3.  Soft tissue windows demonstrate no supraclavicular adenopathy. Aortic and coronary artery atherosclerosis.  Heart size upper normal, without pericardial effusion.  No pleural fluid. No central pulmonary embolism, on this non-dedicated study.  Left paratracheal 1.7 cm node on image 19/series 2. 1.6 cm subcarinal node on image 26/series 2. Bovine arch.  Small hiatal hernia.  IMPRESSION:  1.  Extensive pulmonary metastasis. 2.  Mediastinal adenopathy, suspicious for nodal metastasis. 3.  Atherosclerosis, including within the coronary arteries. 4.  Small hiatal hernia.  CT ABDOMEN AND PELVIS  Findings:  Normal liver, spleen, distal stomach, pancreas, gallbladder, biliary tract, adrenal glands, left kidney.  Indeterminate upper pole right renal lesion of 1.9 cm.  Infiltrative irregular mass arising from the interpolar right kidney which  measures 14.1 x 12.2 cm on image 76/series 2.  12.0 cm on coronal image 49.  Tumor extends towards and may involve the right renal vein, including on image 67/series 2.  There is extensive neovascularity.  Aortic atherosclerosis.  11 mm left periaortic node on image 74.  Normal colon and terminal ileum.  Normal small bowel without abdominal ascites.    No pelvic adenopathy.  Normal urinary bladder.  Mild prostatomegaly.  Probable bone islands in the right pelvis.  Minimal wedging of the superior endplate of L1, without canal compromise.  Disc bulge at L4-L5.  IMPRESSION:  1.  Infiltrative right renal mass, most consistent with renal cell carcinoma.  Extension towards and probable involvement of the right renal vein. 2.  Retroperitoneal adenopathy, suspicious for nodal metastasis. 3.  Indeterminate upper pole right renal lesion, of doubtful clinical significance. 4.   Prostatomegaly.   Original Report Authenticated By: Jeronimo Greaves, M.D.    CBC  Recent Labs Lab 03/28/13 1904 03/29/13 0700  WBC 10.7* 6.9  HGB 12.0* 12.0*  HCT 34.2* 35.7*  PLT 210 181  MCV 84.4 84.4  MCH 29.6 28.4  MCHC 35.1 33.6  RDW 13.7 13.8  LYMPHSABS 0.9 0.7  MONOABS 0.8 0.1  EOSABS 0.1 0.0  BASOSABS 0.0 0.0    Chemistries   Recent Labs Lab 03/28/13 1904 03/29/13 0700 03/29/13 1745  NA 137 138 134*  K 4.4 4.1 4.6  CL 102 104 99  CO2 25 22 25   GLUCOSE 241* 268* 471*  BUN 27* 23 29*  CREATININE 0.87 0.75 1.08  CALCIUM 10.7* 10.3 10.1  AST 17 23  --   ALT 19 17  --   ALKPHOS 96 95  --   BILITOT 0.3 0.4  --    ------------------------------------------------------------------------------------------------------------------ estimated creatinine clearance is 85.3 ml/min (by C-G formula based on Cr of 1.08). ------------------------------------------------------------------------------------------------------------------ No results found for this basename: HGBA1C,  in the last 72 hours ------------------------------------------------------------------------------------------------------------------ No results found for this basename: CHOL, HDL, LDLCALC, TRIG, CHOLHDL, LDLDIRECT,  in the last 72 hours ------------------------------------------------------------------------------------------------------------------ No results found for this basename: TSH, T4TOTAL, FREET3, T3FREE, THYROIDAB,  in the last 72 hours ------------------------------------------------------------------------------------------------------------------ No results found for this basename: VITAMINB12, FOLATE, FERRITIN, TIBC, IRON, RETICCTPCT,  in the last 72 hours  Coagulation profile No results found for this basename: INR, PROTIME,  in the last 168 hours  No results found for this basename: DDIMER,  in the last 72 hours  Cardiac Enzymes  Recent Labs Lab 03/29/13 0130 03/29/13 0700  03/29/13 1429  TROPONINI 1.68* 1.32* 0.95*   ------------------------------------------------------------------------------------------------------------------ No components found with this basename: POCBNP,

## 2013-04-05 ENCOUNTER — Inpatient Hospital Stay (HOSPITAL_COMMUNITY): Payer: Medicare Other

## 2013-04-05 ENCOUNTER — Telehealth: Payer: Self-pay | Admitting: Internal Medicine

## 2013-04-05 HISTORY — PX: OTHER SURGICAL HISTORY: SHX169

## 2013-04-05 LAB — CBC
Hemoglobin: 11.3 g/dL — ABNORMAL LOW (ref 13.0–17.0)
Platelets: 165 10*3/uL (ref 150–400)
RBC: 3.96 MIL/uL — ABNORMAL LOW (ref 4.22–5.81)
WBC: 11.8 10*3/uL — ABNORMAL HIGH (ref 4.0–10.5)

## 2013-04-05 LAB — GLUCOSE, CAPILLARY
Glucose-Capillary: 68 mg/dL — ABNORMAL LOW (ref 70–99)
Glucose-Capillary: 79 mg/dL (ref 70–99)
Glucose-Capillary: 86 mg/dL (ref 70–99)

## 2013-04-05 LAB — BASIC METABOLIC PANEL
Calcium: 9.3 mg/dL (ref 8.4–10.5)
GFR calc Af Amer: >90 mL/min (ref >90)
GFR calc non Af Amer: 86 mL/min — ABNORMAL LOW (ref >90)
Glucose, Bld: 83 mg/dL (ref 70–99)
Sodium: 141 mEq/L (ref 135–145)

## 2013-04-05 LAB — PROTIME-INR
INR: 1.05 (ref 0.00–1.49)
Prothrombin Time: 13.5 seconds (ref 11.6–15.2)

## 2013-04-05 LAB — APTT: aPTT: 28 seconds (ref 24–37)

## 2013-04-05 MED ORDER — PANTOPRAZOLE SODIUM 40 MG PO TBEC: 40.0000 mg | DELAYED_RELEASE_TABLET | Freq: Every day | ORAL | Status: DC

## 2013-04-05 MED ORDER — FENTANYL CITRATE 0.05 MG/ML IJ SOLN
INTRAMUSCULAR | Status: AC | PRN
  Administered 2013-04-05: 25 ug via INTRAVENOUS
  Administered 2013-04-05 (×2): 12.5 ug via INTRAVENOUS

## 2013-04-05 MED ORDER — ASPIRIN 325 MG PO TBEC: 325.0000 mg | DELAYED_RELEASE_TABLET | Freq: Every day | ORAL | Status: AC

## 2013-04-05 MED ORDER — GLUCOSE-VITAMIN C 4-6 GM-MG PO CHEW: 4.0000 | CHEWABLE_TABLET | ORAL | Status: DC | PRN

## 2013-04-05 MED ORDER — DEXAMETHASONE 4 MG PO TABS: 4.0000 mg | ORAL_TABLET | Freq: Two times a day (BID) | ORAL | Status: DC

## 2013-04-05 MED ORDER — GLUCOSE-VITAMIN C 4-6 GM-MG PO CHEW
CHEWABLE_TABLET | ORAL | Status: AC
  Administered 2013-04-05: 4
  Filled 2013-04-05: qty 1

## 2013-04-05 MED ORDER — INSULIN ASPART 100 UNIT/ML ~~LOC~~ SOLN: SUBCUTANEOUS | Status: DC

## 2013-04-05 MED ORDER — LEVETIRACETAM 500 MG PO TABS: 500.0000 mg | ORAL_TABLET | Freq: Two times a day (BID) | ORAL | Status: DC

## 2013-04-05 MED ORDER — FREESTYLE SYSTEM KIT: 1.0000 | PACK | Freq: Three times a day (TID) | Status: DC

## 2013-04-05 MED ORDER — MIDAZOLAM HCL 2 MG/2ML IJ SOLN
INTRAMUSCULAR | Status: AC | PRN
  Administered 2013-04-05: 1 mg via INTRAVENOUS
  Administered 2013-04-05 (×2): 0.5 mg via INTRAVENOUS

## 2013-04-05 NOTE — Discharge Summary (Signed)
[facility]                                                                                   George [name], is a 67 y.o. male  DOB 06-18-1946  MRN [id number].  Admission date:  03/28/2013  Admitting Physician  Eduard Clos, MD  Discharge Date:  04/05/2013   Primary MD  No primary provider on file.  Admission Diagnosis  Seizure [780.39] Altered mental status [780.97] Stroke [434.91]  Discharge Diagnosis     Principal Problem:   Acute encephalopathy Active Problems:   Non-ST elevation MI (NSTEMI)   Diabetes mellitus   Hyperlipidemia   Seizure   CAD (coronary artery disease)   Brain metastases   Right renal mass   Lung metastases      Past Medical History  Diagnosis Date  . Coronary artery disease   . Diabetes mellitus without complication   . Hyperlipidemia     Past Surgical History  Procedure Laterality Date  . Cardiac catheterization       Recommendations for primary care physician for things to follow:   Please follow date the biopsy results closely, make sure patient follows with oncologist within a week.   Discharge Diagnoses:   Principal Problem:   Acute encephalopathy Active Problems:   Non-ST elevation MI (NSTEMI)   Diabetes mellitus   Hyperlipidemia   Seizure   CAD (coronary artery disease)   Brain metastases   Right renal mass   Lung metastases    Discharge Condition: stable   Discharge Medications     Medication List         aspirin 325 MG EC tablet  Take 1 tablet (325 mg total) by mouth daily.  Start taking on:  04/11/2013     dexamethasone 4 MG tablet  Commonly known as:  DECADRON  Take 1 tablet (4 mg total) by mouth 2 (two) times daily with a meal. Discuss with your cancer doctor prior to stopping this medication     fish oil-omega-3 fatty acids 1000 MG capsule  Take 1 g by mouth daily.     glucose monitoring kit monitoring kit  1 each by Does not apply route 4 (four) times daily - after meals and at bedtime. 1  month Diabetic Testing Supplies for QAC-QHS accuchecks.     HYDROcodone-acetaminophen 5-325 MG per tablet  Commonly known as:  NORCO/VICODIN  Take 1 tablet by mouth every 6 (six) hours as needed.     insulin aspart 100 UNIT/ML injection  Commonly known as:  novoLOG  - Before each meal 3 times a day, 140-199 - 2 units, 200-250 - 4 units, 251-299 - 6 units,  300-349 - 8 units,  350 or above 10 units.  - Insulin PEN if approved, provide syringes and needles if needed.     insulin glargine 100 UNIT/ML injection  Commonly known as:  LANTUS  Inject 60 Units into the skin at bedtime.     levETIRAcetam 500 MG tablet  Commonly known as:  KEPPRA  Take 1 tablet (500 mg total) by mouth 2 (two) times daily.     pantoprazole 40 MG tablet  Commonly known as:  PROTONIX  Take 1 tablet (40 mg total) by mouth daily at 12 noon.     simvastatin 80 MG tablet  Commonly known as:  ZOCOR  Take 80 mg by mouth at bedtime.         Diet and Activity recommendation: See Discharge Instructions below   Discharge Instructions     Follow with Primary MD Dr Harrington Challenger in 3 days, Recommended oncologist within a week to followup on your biopsy results. Follow any specific instructions given by interventional radiologist after your biopsy.  Get CBC, CMP, checked 3 days by Primary MD and again as instructed by your Primary MD.   Get Medicines reviewed and adjusted.  Please request your Prim.MD to go over all Hospital Tests and Procedure/Radiological results at the follow up, please get all Hospital records sent to your Prim MD by signing hospital release before you go home.  Activity: As tolerated with Full fall precautions use walker/cane & assistance as needed   Diet:  Heart Healthy - low carb   Accuchecks 4 times/day, Once in AM empty stomach and then before each meal. Log in all results and show them to your Prim.MD in 3 days. If any glucose reading is under 80 or above 300 call your Prim MD  immidiately. Follow Low glucose instructions for glucose under 80 as instructed.   For Heart failure patients - Check your Weight same time everyday, if you gain over 2 pounds, or you develop in leg swelling, experience more shortness of breath or chest pain, call your Primary MD immediately. Follow Cardiac Low Salt Diet and 1.8 lit/day fluid restriction.  Disposition Home    If you experience worsening of your admission symptoms, develop shortness of breath, life threatening emergency, suicidal or homicidal thoughts you must seek medical attention immediately by calling 911 or calling your MD immediately  if symptoms less severe.  You Must read complete instructions/literature along with all the possible adverse reactions/side effects for all the Medicines you take and that have been prescribed to you. Take any new Medicines after you have completely understood and accpet all the possible adverse reactions/side effects.   Do not drive and provide baby sitting services if your were admitted for syncope or siezures until you have seen by Primary MD or a Neurologist and advised to do so again.  Do not drive when taking Pain medications.    Do not take more than prescribed Pain, Sleep and Anxiety Medications  Special Instructions: If you have smoked or chewed Tobacco  in the last 2 yrs please stop smoking, stop any regular Alcohol  and or any Recreational drug use.  Wear Seat belts while driving.   Please note  You were cared for by a hospitalist during your hospital stay. If you have any questions about your discharge medications or the care you received while you were in the hospital after you are discharged, you can call the unit and asked to speak with the hospitalist on call if the hospitalist that took care of you is not available. Once you are discharged, your primary care physician will handle any further medical issues. Please note that NO REFILLS for any discharge medications will be  authorized once you are discharged, as it is imperative that you return to your primary care physician (or establish a relationship with a primary care physician if you do not have one) for your aftercare needs so that they can reassess your need for medications and monitor your lab values.  Follow-up Information   Follow up with Daisy Floro, MD. Schedule an appointment as soon as possible for a visit in 2 days.   Specialty:  Family Medicine   Contact information:   [address] [place] [address] [phone]       Follow up with Antony Haste, MD. Schedule an appointment as soon as possible for a visit in 2 weeks.   Specialty:  Urology   Contact information:   [address] [place] [address] [phone]       Follow up with Donato Schultz, MD. Schedule an appointment as soon as possible for a visit in 1 week.   Specialty:  Cardiology   Contact information:   [address] [place] [address] [phone]       Follow up with Dr Myra Rude On 04/13/2013. (at 1:30pm; Cancer MD)    Contact information:   [address]  [place], [place] [address] tele # [phone]        Consults IR,Cards, Urology,Oncology outpt    History of present illness and  Hospital Course:     Kindly see H&P for history of present illness and admission details, please review complete Labs, Consult reports and Test reports for all details in brief George [name], is a 67 y.o. male, patient with history of CAD status post stent, diabetes mellitus type 2, and hyperlipidemia who was found on the floor in his house by his wife that around 6 PM.     Patient was found to be initially confused but was arousable. Patient had incontinence of urine. Later patient was able to stand up. Did not have any focal deficits. The last seen normal was on 2 PM. Patient was brought to the ER. CT head showed features concerning for mass versus stroke. While in the ER the patient  was seen to have a focal seizure with right eye deviation and tonic clonic activity.      Neurologist on-call Dr. Amada Jupiter evaluated the patient and the patient was started on Keppra and Decadron for possible seizures and brain mass. Family further described a several day history of difficulty finding his words, and forgetting what he was going to say. Additionally, patient's troponin was found to be elevated with EKG showing no ST elevations. On-call Cardiologist was consulted by ER physician. Patient did have some chest discomfort the week prior to his admit, and was originally scheduled for cardiac catheter the day of his admission.       MRI of the brain reveals mets to the brain. Further CT imaging ordered to find primary. Pt has been found to have a right renal mass and metastasis to the brain and lungs .Urology consulted as well but no need for nephrectomy at this time. He will undergo biopsy in a short while by interventional radiology and thereafter if stable he will be discharged home with close outpatient followup with urology and oncology, case management has been requested to set up outpatient oncology appointment within a week post discharge so that patient can follow with biopsy results and for further treatment plan in a timely manner.     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     He had nonspecific troponin elevation not NSTEMI PER CARDIOLOGY, [facility] saw the patient, for  now he will continue his statin, his heart rate is too low to tolerate beta blocker, he will resume aspirin few days after his kidney biopsy, I discussed personally with interventional radiologist performing the procedure who said there is no contraindication in resuming aspirin post kidney biopsy. I will  request patient to follow with [facility] cardiology one time after discharge.     For his type 2 diabetes mellitus he will continue his 60 unit of Lantus twice a day since he is on Decadron I will add sliding scale NovoLog with meals.A1c was 7.4. He will continue his home dose statin for his dyslipidemia.       Today   Subjective:   George [name] today has no headache,no chest abdominal pain,no new weakness tingling or numbness, feels much better wants to go home today.    Objective:   Blood pressure 108/92, pulse 51, temperature 97.8 F (36.6 C), temperature source Oral, resp. rate 15, height 6\' 2"  (1.88 m), weight 100.557 kg (221 lb 11 oz), SpO2 100.00%.   Intake/Output Summary (Last 24 hours) at 04/05/13 1317 Last data filed at 04/05/13 0910  Gross per 24 hour  Intake    120 ml  Output      0 ml  Net    120 ml    Exam Awake Alert, Oriented *3, No new F.N deficits, Normal affect [place].AT,PERRAL Supple Neck,No JVD, No cervical lymphadenopathy appriciated.  Symmetrical Chest wall movement, Good air movement bilaterally, CTAB RRR,No Gallops,Rubs or new Murmurs, No Parasternal Heave +ve B.Sounds, Abd Soft, Non tender, No organomegaly appriciated, No rebound -guarding or rigidity. No Cyanosis, Clubbing or edema, No new Rash or bruise  Data Review   Major procedures and Radiology Reports - PLEASE review detailed and final reports for all details, in brief -   IR - CT guided Renal Biopsy   Dg Chest 2 View  03/28/2013   *RADIOLOGY REPORT*  Clinical Data: Altered mental status, fall  CHEST - 2 VIEW  Comparison: None.  Findings: Cardiac leads overlie the chest.  Heart size is normal. Lung volumes are low but clear.  No pleural effusion.  No acute osseous finding. Mild central bronchial wall thickening noted.  IMPRESSION: No focal pulmonary opacity.  Mild central bronchial wall thickening which may indicate reactive airways disease or bronchitis.   Original Report Authenticated By:  Christiana Pellant, M.D.   Ct Head Wo Contrast  03/28/2013   *RADIOLOGY REPORT*  Clinical Data:  Altered mental status and fall  CT HEAD WITHOUT CONTRAST CT CERVICAL SPINE WITHOUT CONTRAST  Technique:  Multidetector CT imaging of the head and cervical spine was performed following the standard protocol without intravenous contrast.  Multiplanar CT image reconstructions of the cervical spine were also generated.  Comparison:  CT 03/26/1999 is not digitized.  Report is reviewed.  CT HEAD  Findings: There is a large area of abnormal hypo attenuation within the gray matter and extending to the cortical surface in the left frontoparietal lobe region.  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  No acute hemorrhage is seen.  No sulcal effacement.  No midline shift.  No skull fracture. Orbits and paranasal sinuses are intact.  IMPRESSION: Abnormal area of predominately white matter left frontoparietal lobe hypodensity, which may indicate acute versus subacute infarct. Infiltrative neoplasm such as glioblastoma multiforme is possible but considered less likely given absence of focal effacement.  No acute hemorrhage.  CT CERVICAL SPINE  Findings: C1 through the cervical thoracic junction is visualized  in its entirety. No precervical soft tissue widening is present. No fracture or dislocation.  Alignment is normal.  IMPRESSION: No acute cervical spine abnormality.  Critical Value/emergent results were called by telephone at the time of interpretation on 03/28/2013 at 8:25 p.m. to Dr. Criss Alvine, who verbally acknowledged these results.   Original Report Authenticated By: Christiana Pellant, M.D.   Ct Chest W Contrast  03/31/2013   *RADIOLOGY REPORT*  Clinical Data:  Metastasis to the brain.  History of smoking. Evaluate for lung cancer.  CT CHEST, ABDOMEN AND PELVIS WITH CONTRAST  Technique: Contiguous axial images of the chest abdomen and pelvis were obtained after IV contrast administration.   Contrast: 100  ml Omnipaque-300  Comparison: Plain film chest 03/28/2013.  No prior CTs.  CT CHEST  Findings: Lung windows demonstrate probable secretions within the trachea.  Mild centrilobular emphysema.  Multiple bilateral pulmonary nodules, most consistent with metastatic disease.  Index right upper lobe lung nodule measures 1.2 cm on image 17/series 3. Index right lower lobe lung nodule measures 2.1 cm on image 34/series 3. Index left upper lobe nodule measures 1.1 cm on image 12/series 3.  Soft tissue windows demonstrate no supraclavicular adenopathy. Aortic and coronary artery atherosclerosis.  Heart size upper normal, without pericardial effusion.  No pleural fluid. No central pulmonary embolism, on this non-dedicated study.  Left paratracheal 1.7 cm node on image 19/series 2. 1.6 cm subcarinal node on image 26/series 2. Bovine arch.  Small hiatal hernia.  IMPRESSION:  1.  Extensive pulmonary metastasis. 2.  Mediastinal adenopathy, suspicious for nodal metastasis. 3.  Atherosclerosis, including within the coronary arteries. 4.  Small hiatal hernia.  CT ABDOMEN AND PELVIS  Findings:  Normal liver, spleen, distal stomach, pancreas, gallbladder, biliary tract, adrenal glands, left kidney.  Indeterminate upper pole right renal lesion of 1.9 cm.  Infiltrative irregular mass arising from the interpolar right kidney which measures 14.1 x 12.2 cm on image 76/series 2.  12.0 cm on coronal image 49.  Tumor extends towards and may involve the right renal vein, including on image 67/series 2.  There is extensive neovascularity.  Aortic atherosclerosis.  11 mm left periaortic node on image 74.  Normal colon and terminal ileum.  Normal small bowel without abdominal ascites.    No pelvic adenopathy.  Normal urinary bladder.  Mild prostatomegaly.  Probable bone islands in the right pelvis.  Minimal wedging of the superior endplate of L1, without canal compromise.  Disc bulge at L4-L5.  IMPRESSION:  1.  Infiltrative right  renal mass, most consistent with renal cell carcinoma.  Extension towards and probable involvement of the right renal vein. 2.  Retroperitoneal adenopathy, suspicious for nodal metastasis. 3.  Indeterminate upper pole right renal lesion, of doubtful clinical significance. 4.  Prostatomegaly.   Original Report Authenticated By: Jeronimo Greaves, M.D.   Ct Cervical Spine Wo Contrast  03/28/2013   *RADIOLOGY REPORT*  Clinical Data:  Altered mental status and fall  CT HEAD WITHOUT CONTRAST CT CERVICAL SPINE WITHOUT CONTRAST  Technique:  Multidetector CT imaging of the head and cervical spine was performed following the standard protocol without intravenous contrast.  Multiplanar CT image reconstructions of the cervical spine were also generated.  Comparison:  CT 03/26/1999 is not digitized.  Report is reviewed.  CT HEAD  Findings: There is a large area of abnormal hypo attenuation within the gray matter and extending to the cortical surface in the left frontoparietal lobe region.  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  No acute hemorrhage is seen.  No sulcal effacement.  No midline shift.  No skull fracture. Orbits and paranasal sinuses are intact.  IMPRESSION: Abnormal area of predominately white matter left frontoparietal lobe hypodensity, which may indicate acute versus subacute infarct. Infiltrative neoplasm such as glioblastoma multiforme is possible but considered less likely given absence of focal effacement.  No acute hemorrhage.  CT CERVICAL SPINE  Findings: C1 through the cervical thoracic junction is visualized in its entirety. No precervical soft tissue widening is present. No fracture or dislocation.  Alignment is normal.  IMPRESSION: No acute cervical spine abnormality.  Critical Value/emergent results were called by telephone at the time of interpretation on 03/28/2013 at 8:25 p.m. to Dr. Regenia Skeeter, who verbally acknowledged these results.   Original Report  Authenticated By: Conchita Paris, M.D.   Mr Jeri Cos Wo Contrast  03/30/2013   *RADIOLOGY REPORT*  Clinical Data: Seizure  MRI HEAD WITHOUT AND WITH CONTRAST  Technique:  Multiplanar, multiecho pulse sequences of the brain and surrounding structures were obtained according to standard protocol without and with intravenous contrast  Contrast: 44mL MULTIHANCE GADOBENATE DIMEGLUMINE 529 MG/ML IV SOLN  Comparison: CT 03/28/2013  Findings: Multiple enhancing mass lesions are present compatible with metastatic disease.  The largest lesion in the left frontal lobe measures 16 x 16 mm and has a large amount of surrounding white matter edema. Minimal associated hemorrhage.  4 mm enhancing lesion is seen posterior to this lesion.  8 x 17 mm periventricular lesion on the left. 5 mm enhancing lesion right parietal lobe with mild edema 8 x 12 mm left cerebellar lesion. Mild cerebellar edema on the left. 5 mm left cerebellar lesion 6 x 10 mm left posterior inferior cerebellar lesion.  No significant mass effect on the fourth ventricle is present.  Ventricle size is normal.  Mild atrophy is present.  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  2 mm midline shift to the right.  Negative for acute infarct.  There is a small amount of restricted diffusion in the left periventricular white matter lesion and a small amount of restricted diffusion in the larger left frontal lesion.  IMPRESSION: Multiple enhancing lesions in the brain compatible with metastatic disease.  There is a large amount of white matter edema in the left frontal lobe with 3 enhancing masses in the left frontal lobe. Mild midline shift 2 mm.   Original Report Authenticated By: Carl Best, M.D.   Ct Abdomen Pelvis W Contrast  03/31/2013   *RADIOLOGY REPORT*  Clinical Data:  Metastasis to the brain.  History of smoking. Evaluate for lung cancer.  CT CHEST, ABDOMEN AND PELVIS WITH CONTRAST  Technique: Contiguous axial images of the  chest abdomen and pelvis were obtained after IV contrast administration.  Contrast: 100  ml Omnipaque-300  Comparison: Plain film chest 03/28/2013.  No prior CTs.  CT CHEST  Findings: Lung windows demonstrate probable secretions within the trachea.  Mild centrilobular emphysema.  Multiple bilateral pulmonary nodules, most consistent with metastatic disease.  Index right upper lobe lung nodule measures 1.2 cm on image 17/series 3. Index right lower lobe lung nodule measures 2.1 cm on image 34/series 3. Index left upper lobe nodule measures 1.1 cm on image 12/series 3.  Soft tissue windows demonstrate no supraclavicular adenopathy. Aortic and coronary artery atherosclerosis.  Heart size upper normal, without pericardial effusion.  No pleural fluid. No central pulmonary embolism, on this non-dedicated study.  Left paratracheal 1.7 cm node on  image 19/series 2. 1.6 cm subcarinal node on image 26/series 2. Bovine arch.  Small hiatal hernia.  IMPRESSION:  1.  Extensive pulmonary metastasis. 2.  Mediastinal adenopathy, suspicious for nodal metastasis. 3.  Atherosclerosis, including within the coronary arteries. 4.  Small hiatal hernia.  CT ABDOMEN AND PELVIS  Findings:  Normal liver, spleen, distal stomach, pancreas, gallbladder, biliary tract, adrenal glands, left kidney.  Indeterminate upper pole right renal lesion of 1.9 cm.  Infiltrative irregular mass arising from the interpolar right kidney which measures 14.1 x 12.2 cm on image 76/series 2.  12.0 cm on coronal image 49.  Tumor extends towards and may involve the right renal vein, including on image 67/series 2.  There is extensive neovascularity.  Aortic atherosclerosis.  11 mm left periaortic node on image 74.  Normal colon and terminal ileum.  Normal small bowel without abdominal ascites.    No pelvic adenopathy.  Normal urinary bladder.  Mild prostatomegaly.  Probable bone islands in the right pelvis.  Minimal wedging of the superior endplate of L1, without canal  compromise.  Disc bulge at L4-L5.  IMPRESSION:  1.  Infiltrative right renal mass, most consistent with renal cell carcinoma.  Extension towards and probable involvement of the right renal vein. 2.  Retroperitoneal adenopathy, suspicious for nodal metastasis. 3.  Indeterminate upper pole right renal lesion, of doubtful clinical significance. 4.  Prostatomegaly.   Original Report Authenticated By: Jeronimo Greaves, M.D.    Micro Results     Recent Results (from the past 240 hour(s))  MRSA PCR SCREENING     Status: None   Collection Time    03/29/13  1:10 AM      Result Value Range Status   MRSA by PCR NEGATIVE  NEGATIVE Final   Comment:            The GeneXpert MRSA Assay (FDA     approved for NASAL specimens     only), is one component of a     comprehensive MRSA colonization     surveillance program. It is not     intended to diagnose MRSA     infection nor to guide or     monitor treatment for     MRSA infections.     CBC w Diff: Lab Results  Component Value Date   WBC 11.8* 04/05/2013   HGB 11.3* 04/05/2013   HCT 33.9* 04/05/2013   PLT 165 04/05/2013   LYMPHOPCT 10* 03/29/2013   MONOPCT 1* 03/29/2013   EOSPCT 0 03/29/2013   BASOPCT 0 03/29/2013    CMP: Lab Results  Component Value Date   NA 141 04/05/2013   K 4.1 04/05/2013   CL 104 04/05/2013   CO2 31 04/05/2013   BUN 23 04/05/2013   CREATININE 0.92 04/05/2013   PROT 7.2 03/29/2013   ALBUMIN 3.4* 03/29/2013   BILITOT 0.4 03/29/2013   ALKPHOS 95 03/29/2013   AST 23 03/29/2013   ALT 17 03/29/2013  .   Total Time in preparing paper work, data evaluation and todays exam - 35 minutes  Leroy Sea M.D on 04/05/2013 at 1:17 PM  [facility] Office  [phone]

## 2013-04-05 NOTE — Procedures (Signed)
US guided biopsy of right renal mass.  Two core samples obtained.  No immediate complication.

## 2013-04-05 NOTE — Progress Notes (Signed)
Hypoglycemic Event  CBG: 68  Treatment: 3 glucose tabs  Symptoms: None  Follow-up CBG: Time:0830 CBG Result:79  Possible Reasons for Event: Inadequate meal intake  Comments/MD notified: Dr. Thedore Mins notified.    [name], George Goldsmith  Remember to initiate Hypoglycemia Order Set & complete

## 2013-04-05 NOTE — Telephone Encounter (Signed)
PT SCHEDULE TO SEE DR. Rosie Fate 09/10 @ 1:30.  DX-METS BRAIN/METS LUNG WELCOME PACKET MAILED.

## 2013-04-05 NOTE — Progress Notes (Signed)
Tiffany [phone]) called to set up with Oncology - Apt made with Dr Myra Rude for Sept 10, 2014 at 1:30pm; B Shelba Flake

## 2013-04-05 NOTE — Progress Notes (Signed)
Discharge instructions and prescriptions given to patient and wife.  All questions answered.  IV was removed.  Assessment unchanged from morning.  Patient walked down to car by RN with wife and friends.

## 2013-04-06 ENCOUNTER — Ambulatory Visit
Admit: 2013-04-06 | Discharge: 2013-04-06 | Disposition: A | Discharge status: Home / Self Care | Payer: Medicare Other | Attending: Radiation Oncology | Admitting: Radiation Oncology

## 2013-04-06 ENCOUNTER — Telehealth: Payer: Self-pay | Admitting: Internal Medicine

## 2013-04-06 VITALS — BP 158/69 | HR 54 | Temp 98.2°F | Ht 74.0 in | Wt 225.7 lb

## 2013-04-06 DIAGNOSIS — Z510 Encounter for antineoplastic radiation therapy: Secondary | ICD-10-CM | POA: Insufficient documentation

## 2013-04-06 DIAGNOSIS — C7931 Secondary malignant neoplasm of brain: Secondary | ICD-10-CM

## 2013-04-06 DIAGNOSIS — C649 Malignant neoplasm of unspecified kidney, except renal pelvis: Secondary | ICD-10-CM | POA: Insufficient documentation

## 2013-04-06 DIAGNOSIS — C641 Malignant neoplasm of right kidney, except renal pelvis: Secondary | ICD-10-CM

## 2013-04-06 DIAGNOSIS — Z79899 Other long term (current) drug therapy: Secondary | ICD-10-CM | POA: Insufficient documentation

## 2013-04-06 DIAGNOSIS — B370 Candidal stomatitis: Secondary | ICD-10-CM | POA: Insufficient documentation

## 2013-04-06 HISTORY — DX: Malignant (primary) neoplasm, unspecified: C80.1

## 2013-04-06 NOTE — Telephone Encounter (Signed)
C/D 04/06/13 for appt. 04/13/13

## 2013-04-06 NOTE — Progress Notes (Signed)
George [name] here with his wife for nurse eval for CT SIM.  He has pain in his right side that he is rating at a 6/10.  He is here for CT SIM of his brain.

## 2013-04-07 ENCOUNTER — Ambulatory Visit
Admission: RE | Admit: 2013-04-07 | Discharge: 2013-04-07 | Disposition: A | Discharge status: Home / Self Care | Payer: Medicare Other | Source: Ambulatory Visit | Attending: Radiation Oncology | Admitting: Radiation Oncology

## 2013-04-08 ENCOUNTER — Ambulatory Visit
Admission: RE | Admit: 2013-04-08 | Discharge: 2013-04-08 | Disposition: A | Discharge status: Home / Self Care | Payer: Medicare Other | Source: Ambulatory Visit | Attending: Radiation Oncology | Admitting: Radiation Oncology

## 2013-04-08 DIAGNOSIS — C7931 Secondary malignant neoplasm of brain: Secondary | ICD-10-CM

## 2013-04-08 NOTE — Progress Notes (Signed)
   Department of Radiation Oncology  Phone:  [phone] Fax:        [phone]  Weekly Treatment Note    Name: George [name] Date: 04/08/2013 MRN: [id number] DOB: 11-12-1945   Brain:   Current dose: 6 Gy/   Current fraction:2   MEDICATIONS: Current Outpatient Prescriptions  Medication Sig Dispense Refill  . [START ON 04/11/2013] aspirin 325 MG EC tablet Take 1 tablet (325 mg total) by mouth daily.      Marland Kitchen dexamethasone (DECADRON) 4 MG tablet Take 1 tablet (4 mg total) by mouth 2 (two) times daily with a meal. Discuss with your cancer doctor prior to stopping this medication  30 tablet  1  . fish oil-omega-3 fatty acids 1000 MG capsule Take 1 g by mouth daily.      Marland Kitchen glucose monitoring kit (FREESTYLE) monitoring kit 1 each by Does not apply route 4 (four) times daily - after meals and at bedtime. 1 month Diabetic Testing Supplies for QAC-QHS accuchecks.  1 each  1  . HYDROcodone-acetaminophen (NORCO/VICODIN) 5-325 MG per tablet Take 1 tablet by mouth every 6 (six) hours as needed.  30 tablet  0  . insulin aspart (NOVOLOG) 100 UNIT/ML injection Before each meal 3 times a day, 140-199 - 2 units, 200-250 - 4 units, 251-299 - 6 units,  300-349 - 8 units,  350 or above 10 units. Insulin PEN if approved, provide syringes and needles if needed.  1 vial  12  . insulin glargine (LANTUS) 100 UNIT/ML injection Inject 60 Units into the skin at bedtime.      . levETIRAcetam (KEPPRA) 500 MG tablet Take 1 tablet (500 mg total) by mouth 2 (two) times daily.  60 tablet  1  . pantoprazole (PROTONIX) 40 MG tablet Take 1 tablet (40 mg total) by mouth daily at 12 noon.  30 tablet  0  . simvastatin (ZOCOR) 80 MG tablet Take 80 mg by mouth at bedtime.       No current facility-administered medications for this encounter.     ALLERGIES: Lipitor and Metformin and related   LABORATORY DATA:  Lab Results  Component Value Date   WBC 11.8* 04/05/2013   HGB 11.3* 04/05/2013   HCT 33.9* 04/05/2013   MCV 85.6  04/05/2013   PLT 165 04/05/2013   Lab Results  Component Value Date   NA 141 04/05/2013   K 4.1 04/05/2013   CL 104 04/05/2013   CO2 31 04/05/2013   Lab Results  Component Value Date   ALT 17 03/29/2013   AST 23 03/29/2013   ALKPHOS 95 03/29/2013   BILITOT 0.4 03/29/2013     NARRATIVE: George [name] was seen today for weekly treatment management. The chart was checked and the patient's films were reviewed. The patient has begun his treatment. He is doing well so far. He noticed a little bit of soreness in the back area on the right after treatment yesterday. We discussed that this was likely to 2 lying on the treatment table yesterday.  PHYSICAL EXAMINATION: Alert, in no acute distress.   ASSESSMENT: The patient is doing satisfactorily with treatment.  PLAN: We will continue with the patient's radiation treatment as planned.

## 2013-04-11 ENCOUNTER — Ambulatory Visit
Admission: RE | Admit: 2013-04-11 | Discharge: 2013-04-11 | Disposition: A | Discharge status: Home / Self Care | Payer: Medicare Other | Source: Ambulatory Visit | Attending: Radiation Oncology | Admitting: Radiation Oncology

## 2013-04-12 ENCOUNTER — Ambulatory Visit
Admission: RE | Admit: 2013-04-12 | Discharge: 2013-04-12 | Disposition: A | Discharge status: Home / Self Care | Payer: Medicare Other | Source: Ambulatory Visit | Attending: Radiation Oncology | Admitting: Radiation Oncology

## 2013-04-13 ENCOUNTER — Telehealth: Payer: Self-pay | Admitting: Internal Medicine

## 2013-04-13 ENCOUNTER — Encounter: Payer: Self-pay | Admitting: Internal Medicine

## 2013-04-13 ENCOUNTER — Ambulatory Visit: Payer: Medicare Other

## 2013-04-13 ENCOUNTER — Ambulatory Visit
Admission: RE | Admit: 2013-04-13 | Discharge: 2013-04-13 | Disposition: A | Discharge status: Home / Self Care | Payer: Medicare Other | Source: Ambulatory Visit | Attending: Radiation Oncology | Admitting: Radiation Oncology

## 2013-04-13 ENCOUNTER — Ambulatory Visit (HOSPITAL_BASED_OUTPATIENT_CLINIC_OR_DEPARTMENT_OTHER): Payer: Medicare Other | Admitting: Internal Medicine

## 2013-04-13 VITALS — BP 136/49 | HR 55 | Temp 98.7°F | Resp 17 | Ht 74.0 in | Wt 213.1 lb

## 2013-04-13 DIAGNOSIS — C641 Malignant neoplasm of right kidney, except renal pelvis: Secondary | ICD-10-CM

## 2013-04-13 DIAGNOSIS — C7931 Secondary malignant neoplasm of brain: Secondary | ICD-10-CM

## 2013-04-13 DIAGNOSIS — C7800 Secondary malignant neoplasm of unspecified lung: Secondary | ICD-10-CM

## 2013-04-13 DIAGNOSIS — C649 Malignant neoplasm of unspecified kidney, except renal pelvis: Secondary | ICD-10-CM

## 2013-04-13 DIAGNOSIS — C801 Malignant (primary) neoplasm, unspecified: Secondary | ICD-10-CM

## 2013-04-13 NOTE — Patient Instructions (Addendum)
Temsirolimus injection What is this medicine?  TEMSIROLIMUS (TEM sir OH li mus) is a drug that alters immune system response in the body. It is used to treat renal cell cancer. This medicine may be used for other purposes; ask your health care provider or pharmacist if you have questions. What should I tell my health care provider before I take this medicine? They need to know if you have any of these conditions: -diabetes -heart disease -high cholesterol -immune system problems -infection (especially a virus infection such as chickenpox, cold sores, or herpes) -liver disease -low blood counts, like low white cell, platelet, or red cell counts -lung or breathing disease, like asthma -take medicines that treat or prevent blood clots -an unusual or allergic reaction to temsirolimus, polysorbate 80, other medicines, foods, dyes, or preservatives -pregnant or trying to get pregnant -breast-feeding How should I use this medicine? This medicine is for infusion into a vein. It is given by a health care professional in a hospital or clinic setting. Talk to your pediatrician regarding the use of this medicine in children. Special care may be needed. Overdosage: If you think you have taken too much of this medicine contact a poison control center or emergency room at once. NOTE: This medicine is only for you. Do not share this medicine with others. What if I miss a dose? It is important not to miss your dose. Call your doctor or health care professional if you are unable to keep an appointment. What may interact with this medicine? Do not take this medicine with any of the following medications: -grapefruit juice -St. John's Wort This medicine may also interact with the following medications: -carbamazepine -dexamethasone -nefazodone -phenobarbital -phenytoin -medicines for heart or kidney problems like captopril, lisinopril -medicines for infection like clarithromycin, itraconazole,  ketoconazole, rifabutin, rifampin, telithromycin -some medicines for HIV like atazanavir, indinavir, nelfinavir, ritonavir, saquinavir -sunitinib -vaccines This list may not describe all possible interactions. Give your health care provider a list of all the medicines, herbs, non-prescription drugs, or dietary supplements you use. Also tell them if you smoke, drink alcohol, or use illegal drugs. Some items may interact with your medicine. What should I watch for while using this medicine? Visit your doctor for regular check-ups. you will need important blood work done while you are taking this medicine. This drug may make you feel generally unwell. This is not uncommon, as chemotherapy can affect healthy cells as well as cancer cells. Report any side effects. Continue your course of treatment even though you feel ill unless your doctor tells you to stop. Call your doctor or health care professional for advice if you get a fever, chills or sore throat, or other symptoms of a cold or flu. Do not treat yourself. This drug decreases your body's ability to fight infections. Try to avoid being around people who are sick. This medicine may increase your risk to bruise or bleed. Call your doctor or health care professional if you notice any unusual bleeding. Be careful brushing and flossing your teeth or using a toothpick because you may get an infection or bleed more easily. If you have any dental work done, tell your dentist you are receiving this medicine. Avoid taking products that contain aspirin, acetaminophen, ibuprofen, naproxen, or ketoprofen unless instructed by your doctor. These medicines may hide a fever. Do not become pregnant while taking this medicine. Women should inform their doctor if they wish to become pregnant or think they might be pregnant. There is a potential for  serious side effects to an unborn child. Women who are able to have children should use effective birth control before,  during, and for 12 weeks after stopping this medicine. Talk to your health care professional or pharmacist for more information. Do not breast-feed an infant while taking this medicine. What side effects may I notice from receiving this medicine? Side effects that you should report to your doctor or health care professional as soon as possible: -allergic reactions like skin rash, itching or hives, swelling of the face, lips, or tongue -breathing problems, cough -chest pain -dizziness -fever or chills, sore throat -hallucination, loss of contact with reality -increased hunger or thirst -increased urination -pain, swelling, warmth in the leg -redness, blistering, peeling or loosening of the skin, including inside the mouth -seizures -swelling of the legs or ankles -trouble passing urine or change in the amount of urine -unusual bleeding or bruising -unusually weak or tired Side effects that usually do not require medical attention (report to your prescriber or health care professional if they continue or are bothersome): -constipation -loss of appetite -mouth sores -nausea, vomiting -stomach pain This list may not describe all possible side effects. Call your doctor for medical advice about side effects. You may report side effects to FDA at 1-800-FDA-1088. Where should I keep my medicine? This drug is given in a hospital or clinic and will not be stored at home. NOTE: This sheet is a summary. It may not cover all possible information. If you have questions about this medicine, talk to your doctor, pharmacist, or health care provider.  2012, Elsevier/Gold Standard. (04/17/2008 4:31:28 PM)Pazopanib oral tablets What is this medicine? PAZOPANIB is a biologic drug used to treat kidney cancer and sarcoma. This medicine may be used for other purposes; ask your health care provider or pharmacist if you have questions. What should I tell my health care provider before I take this  medicine? They need to know if you have any of these conditions: -bleeding problems -have had recent surgery (within 7 days) or are having surgery -heart disease -high blood pressure -history of stroke -liver disease -protein in your urine -stomach or intestine problems -thyroid problems -an unusual or allergic reaction to pazopanib, other medicines, foods, dyes, or preservatives -pregnant or trying to get pregnant -breast-feeding How should I use this medicine? Take this medicine by mouth with a glass of water. Follow the directions on the prescription label. Do not cut, crush or chew this medicine. Take this medicine on an empty stomach, at least 1 hour before or 2 hours after meals. Do not take with food. Do not take with grapefruit juice. Take your medicine at regular intervals. Do not take it more often than directed. Do not stop taking except on your doctor's advice. A special MedGuide will be given to you by the pharmacist with each prescription and refill. Be sure to read this information carefully each time. Talk to your pediatrician regarding the use of this medicine in children. Special care may be needed. Overdosage: If you think you've taken too much of this medicine contact a poison control center or emergency room at once. Overdosage: If you think you have taken too much of this medicine contact a poison control center or emergency room at once. NOTE: This medicine is only for you. Do not share this medicine with others. What if I miss a dose? If you miss a dose, take it as soon as you can. If your next dose is to be taken in less  than 12 hours, then do not take the missed dose. Take the next dose at your regular time. Do not take double or extra doses. What may interact with this medicine? Do not take this medication with any of the following medications: -rifampin This medicine may also interact with the following  medications: -clarithromycin -dextromethorphan -grapefruit or grapefruit juice -ketoconazole -lapatinib -certain medicines for irregular heart beat like amiodarone, bepridil, dofetilide, encainide, flecainide, propafenone, quinidine -midazolam -paclitaxel -ritonavir This list may not describe all possible interactions. Give your health care provider a list of all the medicines, herbs, non-prescription drugs, or dietary supplements you use. Also tell them if you smoke, drink alcohol, or use illegal drugs. Some items may interact with your medicine. What should I watch for while using this medicine? Visit your doctor for regular check ups. You will need to have blood work while you are taking this medicine. This drug may make you feel generally unwell. This is not uncommon, as chemotherapy can affect healthy cells as well as cancer cells. Report any side effects. Continue your course of treatment even though you feel ill unless your doctor tells you to stop. Call your doctor or health care professional for advice if you get a fever, chills or sore throat, or other symptoms of a cold or flu. Do not treat yourself. This drug decreases your body's ability to fight infections. Try to avoid being around people who are sick. This medicine may increase your risk to bruise or bleed. Call your doctor or health care professional if you notice any unusual bleeding. Be careful brushing and flossing your teeth or using a toothpick because you may get an infection or bleed more easily. If you have any dental work done, tell your dentist you are receiving this medicine. Avoid taking products that contain aspirin, acetaminophen, ibuprofen, naproxen, or ketoprofen unless instructed by your doctor. These medicines may hide a fever. Women should inform their doctor if they wish to become pregnant or think they might be pregnant. There is a potential for serious side effects to an unborn child. Talk to your health care  professional or pharmacist for more information. If you are going to have surgery or any other procedures, tell your doctor you are taking this medicine. What side effects may I notice from receiving this medicine? Side effects that you should report to your doctor or health care professional as soon as possible: -abdominal pain -allergic reactions like skin rash, itching or hives, swelling of the face, lips, or tongue -black, tarry stool -changes in vision -chest pain -confusion, trouble speaking or understanding -fast, irregular heartbeat -fever or chills, sore throat -severe headaches -sudden numbness or weakness of the face, arm or leg -trouble walking, dizziness, loss of balance or coordination -unusual bleeding or bruising -yellowing of the eyes or skin Side effects that usually do not require medical attention (Report these to your doctor or health care professional if they continue or are bothersome.): -change in hair color -loose or watery stool -loss of appetite -nausea, vomiting -unusually weak or tired This list may not describe all possible side effects. Call your doctor for medical advice about side effects. You may report side effects to FDA at 1-800-FDA-1088. Where should I keep my medicine? Keep out of the reach of children. Store at room temperature between 15 and 30 degrees C (59 and 86 degrees F). Throw away any unused medicine after the expiration date. NOTE: This sheet is a summary. It may not cover all possible information. If  you have questions about this medicine, talk to your doctor, pharmacist, or health care provider.  2012, Elsevier/Gold Standard. (11/29/2010 2:02:46 PM)Renal Cell Cancer Renal cell cancer (kidney cancer) is a disease in which cancer cells form in the linings of tubules of the kidney. A cancer is an uncontrolled growth of cells and can occur anywhere in the body. Your kidneys are the organs which filter your blood and keep it clean by getting  rid of waste products from your body in your urine. Urine passes from the kidneys into the bladder through long tubes called ureters. The bladder stores the urine until it is passed from the body through the tube which drains the bladder to the outside (urethra). SYMPTOMS  Early in the disease there may be no problems but as the disease worsens some of the problems seen are:  Blood in the urine.  Belly (abdominal) pain.  Decreased red blood cells (anemia).  A swelling in the belly.  Loss of appetite and weight loss.  Fever from unknown causes. DIAGNOSIS   Your caregiver will do a physical exam. This means they check you over.  Laboratory work may show problems (abnormalities) in the urine.  Plain X-rays and some specialized x-rays may be done. Some of these may include a CT scan. Sometimes an IVP (intravenous pyelogram) is done. In this test a dye is injected into a vein and pictures are then taken of the kidneys. The dye travels to the inside of the kidneys, ureters and bladder. Let your caregivers know if you are allergic to iodine or have had a past reaction to dyes used in X-rays. Other specialized x-rays sometimes taken are the MRI (magnetic resonance imaging) and PET scan (positron emission technology).  Angiography is sometimes done in which a dye is put into an artery leading to the kidney so the vessels surrounding the tumor or growth can be studied.  Your caregiver will explain the value of the various testing to you and why it is necessary and helpful. If some of the above tests show a tumor or growth, sometimes a needle biopsy is done to confirm this and find out what the growth is made of. A fine needle aspiration (FNA) is used to remove a sliver of tissue from the kidney. This is done by sticking a needle through your skin and into the kidney. A specialist in looking at cells under the microscope (pathologist) then looks at the biopsy to determine what is wrong. The  pathologist will check for cancer cells. Usually the previous tests mentioned have already given your surgeon enough information to know if an operation is needed. TREATMENT  You will want to discuss treatment choices with your caregivers and see what the best treatment for you is. This will depend on various factors including your age, other health problems and what stage your disease is in. All of this will play a part in your outcome. Some of the treatment choices are:  Surgery is the main treatment and chances of surviving without this are uncommon. Usually the entire kidney is removed if this is possible. This is called a radical nephrectomy. The surgeon removes your kidney, the small gland on top of the kidney (adrenal gland) and the fat surrounding the kidney. You have another adrenal gland on the other side so removing one is not a problem. Sometimes a partial nephrectomy is done in people with one kidney or people with cancer on both sides. This may help to avoid use of an  artificial kidney (dialysis) as only part of a kidney is needed to filter your blood.  Arterial embolization is another treatment. With this treatment a small catheter is threaded from your groin up into your kidney and material is injected into the artery supplying the tumor in the kidney. Without blood supply, the tumor dies off. Sometimes this procedure is used before kidney removal to cut down on blood loss.  Radiation therapy or x-ray therapy can be used if your health is poor and will not allow surgery. Some of the problems with radiation include fatigue, nausea, vomiting, and damage to skin and surrounding tissues.  Chemotherapy may be used. This is a treatment which uses cancer killing medications to fight the cancer. The side effects depend on the medications used. Some side effects may include nausea, vomiting, loss of weight, loss of appetite, hair loss and other problems. Your caregiver can usually give you  medications to overcome most of the problems.  There are many other forms of treatment your caregivers can discuss with you. Together you can determine which treatment will be best for you.  The more you know when dealing with these problems, the more comfortable you will be. Talk over your treatment over with your loved ones. Get a second opinion if you feel it will be of help. Often your surgeon and other caregivers may recommend this for your own comfort or peace of mind. Document Released: 05/31/2004 Document Revised: 10/13/2011 Document Reviewed: 05/11/2008 [facility] Patient Information 2014 [place], [place].   MD Dareen Piano Dr. Is Ferd Hibbs

## 2013-04-13 NOTE — Telephone Encounter (Signed)
gva ndprinted appt sched and avs forpt for Sept and OCT...pt request to have lab the day b4 the visit.

## 2013-04-14 ENCOUNTER — Ambulatory Visit
Admission: RE | Admit: 2013-04-14 | Discharge: 2013-04-14 | Disposition: A | Discharge status: Home / Self Care | Payer: Medicare Other | Source: Ambulatory Visit | Attending: Radiation Oncology | Admitting: Radiation Oncology

## 2013-04-15 ENCOUNTER — Ambulatory Visit
Admission: RE | Admit: 2013-04-15 | Discharge: 2013-04-15 | Disposition: A | Discharge status: Home / Self Care | Payer: Medicare Other | Source: Ambulatory Visit | Attending: Radiation Oncology | Admitting: Radiation Oncology

## 2013-04-15 VITALS — BP 138/52 | HR 52 | Temp 97.7°F | Ht 74.0 in | Wt 212.7 lb

## 2013-04-15 DIAGNOSIS — C7931 Secondary malignant neoplasm of brain: Secondary | ICD-10-CM

## 2013-04-15 MED ORDER — DEXAMETHASONE 4 MG PO TABS: 4.0000 mg | ORAL_TABLET | Freq: Every day | ORAL | Status: DC

## 2013-04-15 MED ORDER — RADIAPLEXRX EX GEL
Freq: Once | CUTANEOUS | Status: AC
  Administered 2013-04-15: 17:00:00 via TOPICAL

## 2013-04-15 MED ORDER — HYDROCODONE-ACETAMINOPHEN 5-325 MG PO TABS: 1.0000 | ORAL_TABLET | Freq: Four times a day (QID) | ORAL | Status: DC | PRN

## 2013-04-15 NOTE — Progress Notes (Signed)
George [name] here with his wife for [facility] under treat visit.  He has had 7 fractions to his abdomen and pelvis.  He denies dizziness, blurred vision, nausea and balance issues.  He is taking decadron is has not been able to sleep.  He is wondering if he can get something to help him sleep.  He is also woried about running out of his pain medication.  He was given the Radiation Therapy and You book and discussed the potential side effects including fatigue, nausea, diarrhea and skin changes.  He was given radiaplex gel and was advised to apply it if he started to get itching or redness twice a day.

## 2013-04-15 NOTE — Patient Instructions (Signed)
Begin decadron 4mg  once a day, beginning tomorrow and for 1 week.  Then take 1/2 tablet once a day x 1 week. Then 1/2 tablet every other day x 1 week.  Call us with any significant worsening of symptoms, for example headache, nausea.

## 2013-04-15 NOTE — Progress Notes (Signed)
   Department of Radiation Oncology  Phone:  [phone] Fax:        [phone]  Weekly Treatment Note    Name: George [name] Date: 04/15/2013 MRN: [id number] DOB: [birth year]   Current dose: 21 Gy  Current fraction: 7   MEDICATIONS: Current Outpatient Prescriptions  Medication Sig Dispense Refill  . aspirin 325 MG EC tablet Take 1 tablet (325 mg total) by mouth daily.      Marland Kitchen dexamethasone (DECADRON) 4 MG tablet Take 1 tablet (4 mg total) by mouth daily with breakfast. Discuss with your cancer doctor prior to stopping this medication  30 tablet  1  . fish oil-omega-3 fatty acids 1000 MG capsule Take 1 g by mouth daily.      Marland Kitchen glucose monitoring kit (FREESTYLE) monitoring kit 1 each by Does not apply route 4 (four) times daily - after meals and at bedtime. 1 month Diabetic Testing Supplies for QAC-QHS accuchecks.  1 each  1  . HYDROcodone-acetaminophen (NORCO/VICODIN) 5-325 MG per tablet Take 1 tablet by mouth every 6 (six) hours as needed. Do not take more than 8 per day.  60 tablet  0  . insulin aspart (NOVOLOG) 100 UNIT/ML injection Before each meal 3 times a day, 140-199 - 2 units, 200-250 - 4 units, 251-299 - 6 units,  300-349 - 8 units,  350 or above 10 units. Insulin PEN if approved, provide syringes and needles if needed.  1 vial  12  . insulin glargine (LANTUS) 100 UNIT/ML injection Inject 60 Units into the skin at bedtime.      . levETIRAcetam (KEPPRA) 500 MG tablet Take 1 tablet (500 mg total) by mouth 2 (two) times daily.  60 tablet  1  . pantoprazole (PROTONIX) 40 MG tablet Take 1 tablet (40 mg total) by mouth daily at 12 noon.  30 tablet  0  . simvastatin (ZOCOR) 80 MG tablet Take 80 mg by mouth at bedtime.       No current facility-administered medications for this encounter.     ALLERGIES: Lipitor and Metformin and related   LABORATORY DATA:  Lab Results  Component Value Date   WBC 11.8* 04/05/2013   HGB 11.3* 04/05/2013   HCT 33.9* 04/05/2013   MCV 85.6  04/05/2013   PLT 165 04/05/2013   Lab Results  Component Value Date   NA 141 04/05/2013   K 4.1 04/05/2013   CL 104 04/05/2013   CO2 31 04/05/2013   Lab Results  Component Value Date   ALT 17 03/29/2013   AST 23 03/29/2013   ALKPHOS 95 03/29/2013   BILITOT 0.4 03/29/2013     NARRATIVE: George [name] was seen today for weekly treatment management. The chart was checked and the patient's films were reviewed. The patient states that he is doing fairly well. Some continued right flank pain on occasion. He is having difficulty sleeping. No worsening headaches or nausea.  PHYSICAL EXAMINATION: height is 6\' 2"  (1.88 m) and weight is 212 lb 11.2 oz (96.48 kg). His temperature is 97.7 F (36.5 C). His blood pressure is 138/52 and his pulse is 52.        ASSESSMENT: The patient is doing satisfactorily with treatment.  PLAN: We will continue with the patient's radiation treatment as planned. Refills were given for Decadron and his pain medication/Vicodin. Instructions were given for the patient to begin tapering Decadron.

## 2013-04-16 ENCOUNTER — Encounter: Payer: Self-pay | Admitting: Internal Medicine

## 2013-04-16 NOTE — Progress Notes (Signed)
Patient History and Physical   Gildardo Cranker, MD [address] [place], [place] [address]  George [name] [id number] [birth year] 67 y.o. 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  He was recently hospitalized from August 25- April 05, 2013 for seizure.  He reports that while he was at home at 2 pm on the day of his admission (03/28/2013), he was going to the floor to straighten up some cards and fell and LOC.  His wife returned from work at 5:15 and reports that she could not awaken him and called 911.  He was brought to the emergency room via EMS.  While in the ED, he had another seizure that responded to ativan.  He then had CT of head followed by an MRI of brain revealing masses consistent with brain metastases.  Additional imaging was performed with CT scan of the chest abdomen and pelvis with contrast (03/31/2013) demonstrating multiple pulmonary nodules consistent with metastatic disease and a very large (14 x 12 cm) mass essentially involving the entire right kidney with significant retroperitoneal lymphadenopathy.  During his admission urology [facility]) was consulted regarding the role of cytoreductive nephrectomy and it was declined given extensive brain metastases.  Radiation oncology [facility]) was also consulted and he was started on whole brain radatition.  He underwent a core biopsy of the right kidney mass on 04/05/2013 by interventional radialogy and it was found to be consistent with carcinoma with clear cell features.  He was discharged on dexamethasone 4 mg daily, hydrocodone-acetaminophen 5-325 mg every six hours and levetiracetam 500 mg bid.   During this admission, his troponin was found to be  elevated without EKG changes with ST elevations.    His symptoms prior to this involved periodic left hand arm numbness and intermittent chest pain over the past several weeks.  Of note, he was planning to have cardiac workup including a cardiac catheter done on 03/29/2013 by his cardiologist.  He had a stress test done about one week prior to the day of admission that was positive.   His cardiologist is Dr. Eliott Nine. He denies a recent echocardiogram.  Wife described several day history of patient having difficulty finding his words, and forgetting what he was going to say.   He denies hematuria but he has lost 10 lbs over the last six months.  He also endorses right side bone pain near his chest with movement from the sitting to standing position or vice-versa.   He was a half a pack a day smoker before this.  Currently, he reports fatigue but denies chest pain or further seizure episodes.  He is on treatment #5 of 10 for pallative radiation to whole brain and right renal mass.   PMH: Past Medical History  Diagnosis Date  . Coronary artery disease   . Diabetes mellitus without complication   . Hyperlipidemia   . Cancer     kidney, mets to brain and lung    Past Surgical History  Procedure Laterality Date  . Cardiac catheterization  03/03    2 stents  . Biopsy of kidney Right 04/05/2013  . Knee surgery Left     Allergies: Allergies  Allergen Reactions  . Lipitor [Atorvastatin]     Made him feel weak  . Metformin And Related Nausea And Vomiting    Medications: Current outpatient prescriptions:aspirin 325 MG EC tablet, Take 1 tablet (325 mg total) by mouth daily., Disp: , Rfl: ;  glucose monitoring kit (FREESTYLE) monitoring kit, 1 each by Does not apply route 4 (four) times daily - after meals and at bedtime. 1 month Diabetic Testing Supplies for QAC-QHS accuchecks., Disp: 1 each, Rfl: 1 insulin aspart (NOVOLOG) 100 UNIT/ML injection, Before each meal 3 times a day, 140-199 - 2  units, 200-250 - 4 units, 251-299 - 6 units,  300-349 - 8 units,  350 or above 10 units. Insulin PEN if approved, provide syringes and needles if needed., Disp: 1 vial, Rfl: 12;  insulin glargine (LANTUS) 100 UNIT/ML injection, Inject 60 Units into the skin at bedtime., Disp: , Rfl:  levETIRAcetam (KEPPRA) 500 MG tablet, Take 1 tablet (500 mg total) by mouth 2 (two) times daily., Disp: 60 tablet, Rfl: 1;  pantoprazole (PROTONIX) 40 MG tablet, Take 1 tablet (40 mg total) by mouth daily at 12 noon., Disp: 30 tablet, Rfl: 0;  simvastatin (ZOCOR) 80 MG tablet, Take 80 mg by mouth at bedtime., Disp: , Rfl:  dexamethasone (DECADRON) 4 MG tablet, Take 1 tablet (4 mg total) by mouth daily with breakfast. Discuss with your cancer doctor prior to stopping this medication, Disp: 30 tablet, Rfl: 1;  fish oil-omega-3 fatty acids 1000 MG capsule, Take 1 g by mouth daily., Disp: , Rfl: ;  HYDROcodone-acetaminophen (NORCO/VICODIN) 5-325 MG per tablet, Take 1 tablet by mouth every 6 (six) hours as needed. Do not take more than 8 per day., Disp: 60 tablet, Rfl: 0   Social History:   reports that he quit smoking about 2 weeks ago. His smoking use included Cigarettes. He smoked 0.50 packs per day. He does not have any smokeless tobacco history on file. He reports that he does not drink alcohol or use illicit drugs.  Family History: Family History  Problem Relation Age of Onset  . Skin cancer Father   . Thyroid cancer Brother   . Kidney cancer Maternal Uncle     Review of Systems: Constitutional ROS: He denies Fever , Chills, Night Sweats, Anorexia, Pain 0/10 only provoked with movement Cardiovascular ROS: positive for - chest pain negative for - edema or shortness of breath Respiratory ROS: negative for - cough, sputum changes or wheezing Neurological ROS: no TIA or stroke symptoms positive for - confusion, numbness/tingling and seizures Dermatological ROS: negative for rash ENT ROS: negative for - epistaxis or  headaches Gastrointestinal ROS: no abdominal pain, change in bowel habits, or black or bloody stools Genito-Urinary ROS: no dysuria, trouble voiding, or hematuria Hematological and Lymphatic ROS: negative for - bleeding problems or blood clots Musculoskeletal ROS: negative for - gait disturbance or joint stiffness Remaining ROS negative.  Physical Exam: Blood pressure 136/49, pulse 55, temperature 98.7 F (37.1 C), temperature source Oral, resp. rate 17, height 6\' 2"  (1.88 m), weight 213 lb 1.6 oz (96.662 kg), SpO2 100.00%. ECOG: 1 General appearance: alert, cooperative, appears stated age and no distress Head: Normocephalic, without obvious abnormality, atraumatic Neck: no adenopathy, supple, symmetrical, trachea midline and thyroid not enlarged, symmetric, no tenderness/mass/nodules Lymph nodes: Cervical, supraclavicular, and axillary nodes normal. HEENT: + tongue lesion on right side s/p tongue biting secondary to prior seizure episode without signs of infection; PERRLa; EOMi. Heart:regular rate and rhythm, S1, S2 normal, no murmur, click,  rub or gallop Lung:chest clear, no wheezing, rales, normal symmetric air entry, Heart exam - S1, S2 normal, no murmur, no gallop, rate regular Abdomin: soft, non-tender, without masses or organomegaly EXT:No peripheral edema Neuro:  Cranial nerves grossly intake; Normal gait.  Good finger to nose.  Strength 5/5 bilaterally.  Skin:  No rashes.   Lab Results: Lab Results  Component Value Date   WBC 11.8* 04/05/2013   HGB 11.3* 04/05/2013   HCT 33.9* 04/05/2013   MCV 85.6 04/05/2013   PLT 165 04/05/2013     Chemistry      Component Value Date/Time   NA 141 04/05/2013 0420   K 4.1 04/05/2013 0420   CL 104 04/05/2013 0420   CO2 31 04/05/2013 0420   BUN 23 04/05/2013 0420   CREATININE 0.92 04/05/2013 0420      Component Value Date/Time   CALCIUM 9.3 04/05/2013 0420   ALKPHOS 95 03/29/2013 0700   AST 23 03/29/2013 0700   ALT 17 03/29/2013 0700   BILITOT 0.4  03/29/2013 0700     Radiological Studies: CT of chest,  abdomen and Pelvis 03/31/2013 CT CHEST, ABDOMEN AND PELVIS WITH CONTRAST  Technique: Contiguous axial images of the chest abdomen and pelvis were obtained after IV contrast administration.  Contrast: 100 ml Omnipaque-300  Comparison: Plain film chest 03/28/2013. No prior CTs.  CT CHEST  Findings: Lung windows demonstrate probable secretions within the trachea. Mild centrilobular emphysema. Multiple bilateral pulmonary nodules, most consistent with metastatic disease. Index right upper lobe lung nodule measures 1.2 cm on image 17/series 3.  Index right lower lobe lung nodule measures 2.1 cm on image  34/series 3.  Index left upper lobe nodule measures 1.1 cm on image 12/series 3.  Soft tissue windows demonstrate no supraclavicular adenopathy.  Aortic and coronary artery atherosclerosis. Heart size upper normal, without pericardial effusion. No pleural fluid. No central pulmonary embolism, on this non-dedicated study. Left paratracheal  1.7 cm node on image 19/series 2.  1.6 cm subcarinal node on image 26/series 2. Bovine arch.  Small hiatal hernia.  IMPRESSION:  1. Extensive pulmonary metastasis.  2. Mediastinal adenopathy, suspicious for nodal metastasis.  3. Atherosclerosis, including within the coronary arteries.  4. Small hiatal hernia.  CT ABDOMEN AND PELVIS  Findings: Normal liver, spleen, distal stomach, pancreas, gallbladder, biliary tract, adrenal glands, left kidney.  Indeterminate upper pole right renal lesion of 1.9 cm.  Infiltrative irregular mass arising from the interpolar right kidney which measures 14.1 x 12.2 cm on image 76/series 2. 12.0 cm on coronal image 49. Tumor extends towards and may involve the right renal vein, including on image 67/series 2. There is extensive neovascularity.  Aortic atherosclerosis. 11 mm left periaortic node on image 74.  Normal colon and terminal ileum. Normal small bowel without  abdominal ascites.  No pelvic adenopathy. Normal urinary bladder. Mild prostatomegaly.  Probable bone islands in the right pelvis. Minimal wedging of the superior endplate of L1, without canal compromise. Disc bulge at L4-L5.   IMPRESSION:  1. Infiltrative right renal mass, most consistent with renal cell carcinoma. Extension towards and probable involvement of the right renal vein.  2. Retroperitoneal adenopathy, suspicious for nodal metastasis.  3. Indeterminate upper pole right renal lesion, of doubtful clinical significance.  4. Prostatomegaly.   MRI of the brain 03/30/2013   Technique: Multiplanar, multiecho pulse sequences of the brain and surrounding structures were obtained according to standard protocol without and with intravenous contrast  Contrast: 20mL MULTIHANCE GADOBENATE DIMEGLUMINE 529 MG/ML  IV SOLN  Comparison: CT 03/28/2013  Findings: Multiple enhancing mass lesions are present compatible with metastatic disease.  The largest lesion in the left frontal lobe measures 16 x 16 mm and has a large amount of surrounding white matter edema. Minimal associated hemorrhage.  4 mm enhancing lesion is seen posterior to this lesion.  8 x 17 mm periventricular lesion on the left.  5 mm enhancing lesion right parietal lobe with mild edema  8 x 12 mm left cerebellar lesion. Mild cerebellar edema on the left.  5 mm left cerebellar lesion  6 x 10 mm left posterior inferior cerebellar lesion. No significant mass effect on the fourth ventricle is present.  Ventricle size is normal. Mild atrophy is present. 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  2 mm midline shift to the right.  Negative for acute infarct. There is a small amount of restricted diffusion in the left periventricular white matter lesion and a small amount of restricted diffusion in the larger left frontal lesion.   IMPRESSION:  Multiple enhancing lesions in the brain compatible with metastatic  disease. There is a large amount of white matter edema in the left frontal lobe with 3 enhancing masses in the left frontal lobe. Mild midline shift 2 mm.  Surgical Pathology: 04/05/2013 Kidney, biopsy, right, renal mass - CARCINOMA WITH CLEAR CELL FEATURES. Microscopic Comment The core biopsies are extensively involved by carcinoma with clear cell features associated with areas of necrosis. The morphologic features are compatible with clear cell type renal cell carcinoma. (JDP:kh 04-06-13) Jimmy Picket MD Pathologist, Electronic Signature (Case signed 04/06/2013) Specimen Gross and Clinical Information Specimen(s) Obtained: Kidney, biopsy, right, renal mass Specimen Clinical Information Metastatic Disease (jmc) Gross Received in formalin are two cores of soft white tissue measuring 1.3 and 1.6 cm in length and each less than 0.1 cm in diameter. The specimen is submitted in toto. 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  He has a low hemoglobin and elevated calcium.  He is being followed by our clinic for systemic chemotherapy.  He is ECOG 0/1. He denies a history of liver problems, kidney problems.  He does have cardiac history for which he was actively undergoing work-up until current diagnosis.  -We reviewed his pathology from his recent right kidney biopsy which is consistent with clear cell renal cell carcinoma.  We reviewed the images and told him that Stage IV renal cell carcinoma is not curable with extensive presumed metastases in the brain and lung.     - We reviewed with the patient that based on Heng's model (Heng et al, JCO, 2009), a prognostic factors for overall survival (OS) in patients with metastatic renal cell carcinoma (RCC) treated with vascular endothelial growth factor (VEGF) -targeted therapy, he has at least  two risk factor identified, low hemoglobin and elevate corrected  calcium.   Other risk factors including , Karnofsky score of less than 80%, and time of initial diagnosis to initiation of treatment less than a year.  He would be intermediate risk based of two factors or poor risk if we count time of initial diagnosis to initiation of treatment less than a year. For intermediate risk group the median overall survival was 27 months and 2 year overall survival was 53%;  And the poor-risk group (greater than 3 factors) the median overall survival was 8.8 months and 2 year OS was 7%.  Other independent poor risk factors for this study included a ANC greater than ULN or Platelet count greater than ULN.  His platelets are normal and ANC was normal prior to decadron.   -Based on current NCCN guidelines, there are several agents approved for the first-line treatment of stage IV unresectable predominant clear cell histology kidney cancer.  They are sunitinib, temsirolimus (for poor-prognosis patients), bevacizumab + IFN and pazopanib.  We favor pazopanib or temsirolimus.  We discussed that pazopanib (800 mg daily) was an oral VEGFR inhibitor which lead to an improved progression free survival versus placebo.  Common adverse reactions included diarrhea, hypertension, hair color changes, nausea anorexia, vomiting, fatigue, weakness, abdominal pain and headache.  Grade 3 toxicity was hepatotoxicity.  Rare adverse reactions include but is not limited to TIA, TTP, CHF, QT prolongation, hand-foot syndrome, hypothyroidism, pancratitis, GI perforation and hypertensive crisis, myocardial infarction.  Based  of the COMPARZ trial (Motzner et. Al, N Engl J Med. 2013 ), pazopanib was favored over sunitinib based on improved quality of life.  Other advantages include oral medication not requiring weekly visits.    We then discussed temsirolimus (25 mg IV weekly), an inhibitor of the mTOR protein. The predictors of short survival used to select patients for temsirolimus (Hudes G, et al, NEJM, 2007)  include ldh greater than 1.5 ULN, Hgb less than normal, corrected serum calcium level > 10 mg/dL, interval of less than a year from original diagnosis to the start of systemic therapy, Karnofsky PS < 70, or greater than 2 sites of organ metastasis.  He meets at least 3, so he would be considered poor risk based on low hemoglobin, greater than 2 sites of organ metastasis and elevated calcium level. The group of patients who received temsirolimus alone compared to IFN-alpha showed an improvement in OS over those receiving IFN-alpha alone or in combination.  We reviewed main side effects of temsirolimus which includes but not limited to myelosuppression, hypertrgylcemia, hyperlipidemia, alk phos elevation, rash, nausea/vomiting, diarrhea, cough, fever, back pain, weight lost.  In rare cases, it has caused serious infections, intracerebral hemorrhage, severe infusion reaction or anaphylaxis.    -We provided him with a detailed risks and benefit of chemotherapy. Benefit includes effective control of his disease risks are as detailed above. He understood these risks and chose to proceed with palliative therapy for his metastatic cancer.  He was provided two handouts for each therapies, temsirolimus and pazopanib respectively.  We also expressed clinical trials that are ongoing and we will review for his candidacy.  We also discussed best supportive care.   PLAN: - Obtain EKG, echocardiogram and discuss case with his cardiologist.  He was undergoing evaluation for symptoms of chest pain and left arm numbness prior to kidney cancer diagnosis.  We will weigh which therapy based on risk of cardiac and brain toxicity.  His baseline heart rate is bradycardia and side-effects of pazopanib includes prolonged QT prolongation.   In addition, his case is complicated by brain metastases that have in some case studies have demonstrated intracranial bleeding with treatment - Obtain baseline labs including CMP, LDH, TSH and free  T4 and UA, If he elects pazopanib, we will check LFTs q 2 weeks x 4, the a 4 wk x 2, then periodically.  BP will be obtained at baseline, then periodically.  We will obtain a baseline EF and then periodically. If we pursue, temsirolimus, we will obtain lipid panel at baseline and during treatment; CBC every week; chemistry profile every 2  weeks.   - Obtain bone scan secondary to complaints of pain in his chest wall.  We will evaluate for bony mets and determine eligibility for denosmab or zometa.  - We will allow adequate recovery of his pallative whole brain radiation and renal radiation.   - Return to clinic in 2 weeks to discuss treatments options.   Myra Rude, MD Medical Oncology 04/13/2013  6:00pm

## 2013-04-17 ENCOUNTER — Other Ambulatory Visit: Payer: Self-pay | Admitting: Internal Medicine

## 2013-04-17 DIAGNOSIS — C7800 Secondary malignant neoplasm of unspecified lung: Secondary | ICD-10-CM

## 2013-04-17 DIAGNOSIS — C7931 Secondary malignant neoplasm of brain: Secondary | ICD-10-CM

## 2013-04-17 DIAGNOSIS — C641 Malignant neoplasm of right kidney, except renal pelvis: Secondary | ICD-10-CM

## 2013-04-18 ENCOUNTER — Ambulatory Visit
Admission: RE | Admit: 2013-04-18 | Discharge: 2013-04-18 | Disposition: A | Discharge status: Home / Self Care | Payer: Medicare Other | Source: Ambulatory Visit | Attending: Radiation Oncology | Admitting: Radiation Oncology

## 2013-04-19 ENCOUNTER — Ambulatory Visit
Admission: RE | Admit: 2013-04-19 | Discharge: 2013-04-19 | Disposition: A | Discharge status: Home / Self Care | Payer: Medicare Other | Source: Ambulatory Visit | Attending: Radiation Oncology | Admitting: Radiation Oncology

## 2013-04-20 ENCOUNTER — Ambulatory Visit
Admission: RE | Admit: 2013-04-20 | Discharge: 2013-04-20 | Disposition: A | Discharge status: Home / Self Care | Payer: Medicare Other | Source: Ambulatory Visit | Attending: Radiation Oncology | Admitting: Radiation Oncology

## 2013-04-20 ENCOUNTER — Encounter: Payer: Self-pay | Admitting: Radiation Oncology

## 2013-04-20 ENCOUNTER — Telehealth: Payer: Self-pay | Admitting: Oncology

## 2013-04-20 ENCOUNTER — Encounter: Payer: Medicare Other | Admitting: Radiation Oncology

## 2013-04-20 VITALS — BP 127/44 | HR 61 | Temp 97.8°F | Ht 74.0 in | Wt 211.8 lb

## 2013-04-20 DIAGNOSIS — C7931 Secondary malignant neoplasm of brain: Secondary | ICD-10-CM

## 2013-04-20 MED ORDER — FLUCONAZOLE 100 MG PO TABS: 100.0000 mg | ORAL_TABLET | Freq: Every day | ORAL | Status: DC

## 2013-04-20 NOTE — Addendum Note (Signed)
Encounter addended by: Oneita Hurt, MD on: 04/20/2013 10:03 AM<BR>     Documentation filed: Orders

## 2013-04-20 NOTE — Progress Notes (Signed)
George [name] completes radiation therapy to the brain.  He denies any headaches, vision changes, ataxia, or changes in fine motor movement.  Decadron 4 mg po daily until Thursday and will start on 2 mg po daily on this coming Friday. Note questionable  thrush in the left buccal mucosal region.

## 2013-04-20 NOTE — Progress Notes (Signed)
  Radiation Oncology         [phone] ________________________________  Name: ARRIN PINTOR MRN: [id number]  Date: 04/20/2013  DOB: 1946/06/03  Weekly Radiation Therapy Management  Current Dose: 30 Gy     Planned Dose:  30 Gy  Narrative . . . . . . . . The patient presents for routine under treatment assessment. Mr. Moncrief completes radiation therapy to the brain. He denies any headaches, vision changes, ataxia, or changes in fine motor movement. Decadron 4 mg po daily until Thursday and will start on 2 mg po daily on this coming Friday. Note questionable thrush in the left buccal mucosal region.   He is on steroid taper and notes hyperglycemia, increased appetite and some insomnia from the steroids.                                                   The patient is without complaint.                                 Set-up films were reviewed.                                 The chart was checked. Physical Findings. . .  height is 6\' 2"  (1.88 m) and weight is 211 lb 12.8 oz (96.072 kg). His temperature is 97.8 F (36.6 C). His blood pressure is 127/44 and his pulse is 61. . Weight essentially stable.  No significant changes. Some minimal thrush. Impression . . . . . . . The patient is  tolerating radiation. Plan . . . . . . . . . . . . Complete treatment as planned.  Given Diflucan x 7 days  ________________________________  Artist Pais. Kathrynn Running, M.D.

## 2013-04-20 NOTE — Telephone Encounter (Signed)
, °

## 2013-04-21 ENCOUNTER — Telehealth: Payer: Self-pay

## 2013-04-21 NOTE — Progress Notes (Signed)
  Radiation Oncology         [phone] ________________________________  Name: George [name] MRN: [id number]  Date: 04/20/2013  DOB: 1946-07-19  Telephone contact:  I prescribed diflucan for this patient yesterday.  I note that he is also taking Simvastatin.  Since diflucan can increase his risk for rhabdomyolysis from statin drugs, I called today and advised him to hold simvastatin while taking his 7 day course of diflucan.  He expressed understanding of this and plans to adhere to this direction. ________________________________  Artist Pais Kathrynn Running, M.D.

## 2013-04-21 NOTE — Addendum Note (Signed)
Encounter addended by: Oneita Hurt, MD on: 04/21/2013  8:19 AM<BR>     Documentation filed: Clinical Notes, Notes Section

## 2013-04-21 NOTE — Telephone Encounter (Signed)
lvm that bone scan has been rescheduled because pre-certification is still pending. The new date and time 9/29 at 730 am, pt to arrive at 715 am.

## 2013-04-22 ENCOUNTER — Encounter (HOSPITAL_COMMUNITY): Payer: Medicare Other

## 2013-04-22 ENCOUNTER — Telehealth: Payer: Self-pay | Admitting: Oncology

## 2013-04-22 ENCOUNTER — Telehealth: Payer: Self-pay | Admitting: Internal Medicine

## 2013-04-22 NOTE — Telephone Encounter (Signed)
S/W PT AND GVE NEW APPT 09/24 @ 3 W/DR. GRANFORTUNA REFERRING DR. MOODY DX- BRAIN METS WELCOME PACKET MAILED.

## 2013-04-22 NOTE — Telephone Encounter (Signed)
C/D 04/22/13 for appt. 05/04/13

## 2013-04-25 ENCOUNTER — Ambulatory Visit (HOSPITAL_COMMUNITY)
Admission: RE | Admit: 2013-04-25 | Discharge: 2013-04-25 | Disposition: A | Discharge status: Home / Self Care | Payer: Medicare Other | Source: Ambulatory Visit | Attending: Internal Medicine | Admitting: Internal Medicine

## 2013-04-25 DIAGNOSIS — E119 Type 2 diabetes mellitus without complications: Secondary | ICD-10-CM | POA: Insufficient documentation

## 2013-04-25 DIAGNOSIS — Z01818 Encounter for other preprocedural examination: Secondary | ICD-10-CM | POA: Insufficient documentation

## 2013-04-25 DIAGNOSIS — E785 Hyperlipidemia, unspecified: Secondary | ICD-10-CM | POA: Insufficient documentation

## 2013-04-25 DIAGNOSIS — I252 Old myocardial infarction: Secondary | ICD-10-CM | POA: Insufficient documentation

## 2013-04-25 DIAGNOSIS — C7800 Secondary malignant neoplasm of unspecified lung: Secondary | ICD-10-CM | POA: Insufficient documentation

## 2013-04-25 DIAGNOSIS — I359 Nonrheumatic aortic valve disorder, unspecified: Secondary | ICD-10-CM | POA: Insufficient documentation

## 2013-04-25 DIAGNOSIS — C641 Malignant neoplasm of right kidney, except renal pelvis: Secondary | ICD-10-CM

## 2013-04-25 DIAGNOSIS — I2510 Atherosclerotic heart disease of native coronary artery without angina pectoris: Secondary | ICD-10-CM | POA: Insufficient documentation

## 2013-04-25 DIAGNOSIS — I517 Cardiomegaly: Secondary | ICD-10-CM | POA: Insufficient documentation

## 2013-04-25 DIAGNOSIS — C7931 Secondary malignant neoplasm of brain: Secondary | ICD-10-CM | POA: Insufficient documentation

## 2013-04-25 DIAGNOSIS — C649 Malignant neoplasm of unspecified kidney, except renal pelvis: Secondary | ICD-10-CM | POA: Insufficient documentation

## 2013-04-25 NOTE — Progress Notes (Signed)
  Echocardiogram 2D Echocardiogram has been performed.  Jorje Guild 04/25/2013, 10:41 AM

## 2013-04-27 ENCOUNTER — Encounter: Payer: Self-pay | Admitting: Oncology

## 2013-04-27 ENCOUNTER — Ambulatory Visit (HOSPITAL_BASED_OUTPATIENT_CLINIC_OR_DEPARTMENT_OTHER): Payer: Medicare Other | Admitting: Oncology

## 2013-04-27 ENCOUNTER — Telehealth: Payer: Self-pay | Admitting: Oncology

## 2013-04-27 VITALS — BP 134/45 | HR 67 | Temp 97.3°F | Resp 18 | Ht 74.0 in | Wt 215.0 lb

## 2013-04-27 DIAGNOSIS — N289 Disorder of kidney and ureter, unspecified: Secondary | ICD-10-CM

## 2013-04-27 DIAGNOSIS — R569 Unspecified convulsions: Secondary | ICD-10-CM

## 2013-04-27 DIAGNOSIS — C649 Malignant neoplasm of unspecified kidney, except renal pelvis: Secondary | ICD-10-CM

## 2013-04-27 DIAGNOSIS — C7931 Secondary malignant neoplasm of brain: Secondary | ICD-10-CM

## 2013-04-27 DIAGNOSIS — C7800 Secondary malignant neoplasm of unspecified lung: Secondary | ICD-10-CM

## 2013-04-27 DIAGNOSIS — N2889 Other specified disorders of kidney and ureter: Secondary | ICD-10-CM

## 2013-04-27 HISTORY — DX: Malignant neoplasm of unspecified kidney, except renal pelvis: C64.9

## 2013-04-27 MED ORDER — HYDROCODONE-ACETAMINOPHEN 5-325 MG PO TABS: 1.0000 | ORAL_TABLET | ORAL | Status: DC | PRN

## 2013-04-27 MED ORDER — LEVETIRACETAM 500 MG PO TABS: 500.0000 mg | ORAL_TABLET | Freq: Two times a day (BID) | ORAL | Status: DC

## 2013-04-27 NOTE — Progress Notes (Signed)
Hematology and Oncology Follow Up Visit  George [name] [id number] 16-Jul-1946 67 y.o. 04/27/2013 7:10 PM   Principle Diagnosis: Encounter Diagnoses  Name Primary?  . Right renal mass   . 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 He has a huge the primary originating from his right kidney. He has bilateral pulmonary lesions. He is remarkably asymptomatic. He never had any hematuria. His only complaint is that he gets uncomfortable when he gets up and down from a sitting position. He was seen in initial medical oncology consultation by one of our temporary physicians and requested transfer to a full-time physician. He had a very thorough evaluation by Dr.Chism. Please refer to his excellent and comprehensive note of September 10.  He was started on antiseizure medication. He was evaluated by radiation oncology and has just completed external beam radiation to the whole brain. In addition, he believes he received radiation to the primary in the right kidney but I do not find any documentation of this in the radiation oncology notes.Marland Kitchen He did have a urologic evaluation while he was hospitalized and was not felt to be a candidate for nephrectomy due to the advanced brain lesions.   He is back today to discuss systemic treatment. Dr. Rosie Fate reviewed all the state-of-the-art therapies available for metastatic kidney cancer. His personal preference was to begin a trial of [employer].  Medications: reviewed  Allergies:  Allergies  Allergen Reactions  . Lipitor [Atorvastatin]     Made him feel weak  . Metformin And Related Nausea  And Vomiting    Review of Systems: Constitutional:   No anorexia, fevers, weight loss HEENT no sore throat. 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 Mild radiation burn on his scalp. Respiratory: No cough or dyspnea Cardiovascular:  No chest pain or palpitations Gastrointestinal: No abdominal pain or change in bowel habit Genito-Urinary: No urinary tract symptoms. No hematuria Musculoskeletal: No muscle bone or joint pain Neurologic: No headache or change in vision. No focal weakness. No further seizures. Skin: Radiation burn skin of scalp  Remaining ROS negative.    Physical Exam: Blood pressure 134/45, pulse 67, temperature 97.3 F (36.3 C), temperature source Oral, resp. rate 18, height 6\' 2"  (1.88 m), weight 215 lb (97.523 kg). Wt Readings from Last 3 Encounters:  04/27/13 215 lb (97.523 kg)  04/20/13 211 lb 12.8 oz (96.072 kg)  04/15/13 212 lb 11.2 oz (96.48 kg)     General appearance: Thin Caucasian man in no distress. He appears adequately nourished at this time HENNT: Pharynx no erythema or exudate. Neck is supple. No thyromegaly. Lymph nodes: No cervical, supraclavicular, or axillary adenopathy Breasts: Lungs: Clear to auscultation resonant to percussion Heart: Regular rhythm no murmur or gallop Abdomen: Soft, nontender, large 10 x 8 cm mass felt in the anterolateral right abdomen below the liver Extremities: No edema, no calf tenderness Musculoskeletal: No joint deformities GU: Vascular: No carotid bruits, no cyanosis Neurologic: He is alert and oriented, PERRLA, optic disc sharp and vessels normal, no hemorrhage or exudate, motor strength 5 over 5, reflexes 1+ symmetric, upper body coordination finger to finger finger to hand rapid  alternating movements are normal. Gait normal. Skin: Mild erythema with some desquamation of the skin of the scalp  Lab Results: Lab Results  Component Value Date   WBC 11.8* 04/05/2013   HGB 11.3* 04/05/2013   HCT 33.9*  04/05/2013   MCV 85.6 04/05/2013   PLT 165 04/05/2013     Chemistry      Component Value Date/Time   NA 141 04/05/2013 0420   K 4.1 04/05/2013 0420   CL 104 04/05/2013 0420   CO2 31 04/05/2013 0420   BUN 23 04/05/2013 0420   CREATININE 0.92 04/05/2013 0420      Component Value Date/Time   CALCIUM 9.3 04/05/2013 0420   ALKPHOS 95 03/29/2013 0700   AST 23 03/29/2013 0700   ALT 17 03/29/2013 0700   BILITOT 0.4 03/29/2013 0700    Additional data obtained since last visit: Echocardiogram shows ejection fraction 55-60% with normal wall motion Twelve-lead electrocardiogram which is normal. Bone scan scheduled to be done next week.    Impression  #1. 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  I'm going to start him on Sutent 50 mg daily 4 weeks on 2 weeks off.   I had a lengthy discussion with him and his wife reviewed diagnosis, prognosis, treatment options, mechanisms of actions of the multi-kinase/NT angiogenesis drugs and how they differ from chemotherapy. We discussed potential side effects of these drugs including fatigue, diarrhea, nausea, suppression of blood counts, and in some cases, cardiac arrhythmias.  #2. Coronary artery disease status post coronary stent times 10/03/2001  #3. Insulin-dependent diabetes.  #4. History of carcinoma in situ of the penis.  Over one hour spent with this patient and his wife with more than 50% in counseling and review of x-ray images.      CC:. Dr. Gildardo Cranker; Dr. Dorothy Puffer; Dr. Addison Bailey, MD 9/24/20147:10 PM

## 2013-04-29 ENCOUNTER — Telehealth: Payer: Self-pay | Admitting: Oncology

## 2013-04-29 NOTE — Progress Notes (Addendum)
  Radiation Oncology         [phone] ________________________________  Name: George [name] MRN: [id number]  Date: 04/06/2013  DOB: 1946/03/22  SIMULATION AND TREATMENT PLANNING NOTE  DIAGNOSIS:  Renal cell carcinoma  NARRATIVE:  The patient will receive radiation treatment to 2 separate target areas: Whole brain radiotherapy as well as treatment to a large right-sided renal tumor. The patient was brought to the CT Simulation planning suite.  Identity was confirmed.  All relevant records and images related to the planned course of therapy were reviewed.   Written consent to proceed with treatment was confirmed which was freely given after reviewing the details related to the planned course of therapy had been reviewed with the patient.  Then, the patient was set-up in a stable reproducible  supine position for radiation therapy.  CT images were obtained.  Surface markings were placed.   A customized thermoplastic head chest was constructed to help with the patient immobilization during his treatment for his whole brain radiotherapy.  The CT images were loaded into the planning software.  This consisted of a 4D CT scan to help target the abdominal tumor. A target volume/ITV was contoured based on this information and avoidance structures were also contoured.  Treatment planning then occurred.  The radiation prescription was entered and confirmed.  A total of 9 complex treatment devices were fabricated which relate to the designed radiation treatment fields: 2 whole brain radiotherapy fields and the abdominal tumor will be treated with a 7 field 3-D conformal technique. Each of these customized fields/ complex treatment devices will be used on a daily basis during the radiation course. I have requested : 3D Simulation  I have requested a DVH of the following structures: Target volume, spinal cord, left kidney, liver.   The patient will undergo daily image guidance to ensure accurate localization of  the abdominal target, and adequate minimize dose to the normal surrounding structures in close proximity to the target.   PLAN:  The patient will receive 30 Gy in 10 fractions for whole brain radiotherapy and will also receive 30 gray in 10 fractions to the abdominal tumor. A simultaneous integrated boost technique will be evaluated for possible dose escalation centrally within the large tumor. I anticipate that this area will receive 35 gray in 10 fractions.  ________________________________   Radene Gunning, MD, PhD

## 2013-04-29 NOTE — Addendum Note (Signed)
Encounter addended by: Jonna Coup, MD on: 04/29/2013 11:19 AM<BR>     Documentation filed: Notes Section

## 2013-04-29 NOTE — Addendum Note (Signed)
Encounter addended by: Jonna Coup, MD on: 04/29/2013 10:54 AM<BR>     Documentation filed: Notes Section

## 2013-04-29 NOTE — Progress Notes (Signed)
  Radiation Oncology         [phone] ________________________________  Name: George [name] MRN: [id number]  Date: 04/20/2013  DOB: 07/11/1946  End of Treatment Note  Diagnosis:   Metastatic renal cell carcinoma with brain metastasis     Indication for treatment:  Palliative       Radiation treatment dates:   04/07/2013 through 04/20/2013  Site/dose:    1. whole brain radiotherapy to a dose of 30 gray in 10 fractions 2.  radiotherapy to the large right sided renal tumor using a 7 field 3-D conformal technique to a dose of 30 gray in 10 fractions. The central high dose region received a hyperfractionated course to 35 gray also in 10 fractions simultaneously.   Narrative: The patient tolerated radiation treatment relatively well.   The patient did not exhibit any difficulties with substantial acute toxicity during his treatment.   Plan: The patient has completed radiation treatment. The patient will return to radiation oncology clinic for routine followup in one month. I advised the patient to call or return sooner if they have any questions or concerns related to their recovery or treatment. ________________________________  Radene Gunning, M.D., Ph.D.

## 2013-04-29 NOTE — Progress Notes (Signed)
  Radiation Oncology         [phone] ________________________________  Name: George [name] MRN: [id number]  Date: 04/06/2013  DOB: [birth year]  RESPIRATORY MOTION MANAGEMENT SIMULATION  NARRATIVE:  In order to account for effect of respiratory motion on target structures and other organs in the planning and delivery of radiotherapy, this patient underwent respiratory motion management simulation.  To accomplish this, when the patient was brought to the CT simulation planning suite, 4D respiratoy motion management CT images were obtained.  The CT images were loaded into the planning software.  Then, using a variety of tools including Cine, MIP, and standard views, the target volume and planning target volumes (PTV) were delineated.  Avoidance structures were contoured.  Treatment planning then occurred.  Dose volume histograms were generated and reviewed for each of the requested structure.  The resulting plan was carefully reviewed and approved today.   ------------------------------------------------  Radene Gunning, MD, PhD

## 2013-04-29 NOTE — Telephone Encounter (Signed)
Talked to pt and gave him appt for lab and Md on october 2014

## 2013-05-02 ENCOUNTER — Encounter (HOSPITAL_COMMUNITY): Payer: Medicare Other

## 2013-05-02 ENCOUNTER — Emergency Department (HOSPITAL_COMMUNITY): Payer: Medicare Other

## 2013-05-02 ENCOUNTER — Inpatient Hospital Stay (HOSPITAL_COMMUNITY)
Admission: EM | Admit: 2013-05-02 | Discharge: 2013-05-03 | DRG: 054 | Disposition: A | Discharge status: Home / Self Care | Payer: Medicare Other | Attending: Internal Medicine | Admitting: Internal Medicine

## 2013-05-02 ENCOUNTER — Encounter (HOSPITAL_COMMUNITY): Payer: Self-pay | Admitting: Emergency Medicine

## 2013-05-02 ENCOUNTER — Inpatient Hospital Stay (HOSPITAL_COMMUNITY): Payer: Medicare Other

## 2013-05-02 ENCOUNTER — Telehealth: Payer: Self-pay | Admitting: Internal Medicine

## 2013-05-02 DIAGNOSIS — E119 Type 2 diabetes mellitus without complications: Secondary | ICD-10-CM

## 2013-05-02 DIAGNOSIS — G40802 Other epilepsy, not intractable, without status epilepticus: Secondary | ICD-10-CM | POA: Diagnosis present

## 2013-05-02 DIAGNOSIS — Z7982 Long term (current) use of aspirin: Secondary | ICD-10-CM

## 2013-05-02 DIAGNOSIS — R569 Unspecified convulsions: Secondary | ICD-10-CM

## 2013-05-02 DIAGNOSIS — Z791 Long term (current) use of non-steroidal anti-inflammatories (NSAID): Secondary | ICD-10-CM

## 2013-05-02 DIAGNOSIS — E785 Hyperlipidemia, unspecified: Secondary | ICD-10-CM | POA: Diagnosis present

## 2013-05-02 DIAGNOSIS — T380X5A Adverse effect of glucocorticoids and synthetic analogues, initial encounter: Secondary | ICD-10-CM | POA: Diagnosis present

## 2013-05-02 DIAGNOSIS — I2510 Atherosclerotic heart disease of native coronary artery without angina pectoris: Secondary | ICD-10-CM | POA: Diagnosis present

## 2013-05-02 DIAGNOSIS — Z8051 Family history of malignant neoplasm of kidney: Secondary | ICD-10-CM

## 2013-05-02 DIAGNOSIS — C7800 Secondary malignant neoplasm of unspecified lung: Secondary | ICD-10-CM | POA: Diagnosis present

## 2013-05-02 DIAGNOSIS — G936 Cerebral edema: Secondary | ICD-10-CM | POA: Diagnosis present

## 2013-05-02 DIAGNOSIS — Z808 Family history of malignant neoplasm of other organs or systems: Secondary | ICD-10-CM

## 2013-05-02 DIAGNOSIS — R4701 Aphasia: Secondary | ICD-10-CM

## 2013-05-02 DIAGNOSIS — G9340 Encephalopathy, unspecified: Secondary | ICD-10-CM

## 2013-05-02 DIAGNOSIS — Z794 Long term (current) use of insulin: Secondary | ICD-10-CM

## 2013-05-02 DIAGNOSIS — D649 Anemia, unspecified: Secondary | ICD-10-CM | POA: Diagnosis present

## 2013-05-02 DIAGNOSIS — C649 Malignant neoplasm of unspecified kidney, except renal pelvis: Secondary | ICD-10-CM

## 2013-05-02 DIAGNOSIS — C7931 Secondary malignant neoplasm of brain: Principal | ICD-10-CM | POA: Diagnosis present

## 2013-05-02 DIAGNOSIS — I619 Nontraumatic intracerebral hemorrhage, unspecified: Secondary | ICD-10-CM | POA: Diagnosis present

## 2013-05-02 DIAGNOSIS — Z79899 Other long term (current) drug therapy: Secondary | ICD-10-CM

## 2013-05-02 DIAGNOSIS — Z87891 Personal history of nicotine dependence: Secondary | ICD-10-CM

## 2013-05-02 LAB — BASIC METABOLIC PANEL
Chloride: 103 mEq/L (ref 96–112)
Creatinine, Ser: 0.66 mg/dL (ref 0.50–1.35)
GFR calc Af Amer: >90 mL/min (ref >90)
GFR calc non Af Amer: >90 mL/min (ref >90)
Potassium: 4 mEq/L (ref 3.5–5.1)
Sodium: 136 mEq/L (ref 135–145)

## 2013-05-02 LAB — CBC WITH DIFFERENTIAL/PLATELET
Basophils Relative: 1 % (ref 0–1)
Hemoglobin: 9.3 g/dL — ABNORMAL LOW (ref 13.0–17.0)
Lymphocytes Relative: 11 % — ABNORMAL LOW (ref 12–46)
Lymphs Abs: 0.6 10*3/uL — ABNORMAL LOW (ref 0.7–4.0)
MCH: 28.5 pg (ref 26.0–34.0)
MCV: 85.6 fL (ref 78.0–100.0)
Monocytes Relative: 5 % (ref 3–12)
Neutro Abs: 4.7 10*3/uL (ref 1.7–7.7)
Neutrophils Relative %: 84 % — ABNORMAL HIGH (ref 43–77)
Platelets: 133 10*3/uL — ABNORMAL LOW (ref 150–400)
RBC: 3.26 MIL/uL — ABNORMAL LOW (ref 4.22–5.81)
WBC: 5.6 10*3/uL (ref 4.0–10.5)

## 2013-05-02 LAB — GLUCOSE, CAPILLARY
Glucose-Capillary: 334 mg/dL — ABNORMAL HIGH (ref 70–99)
Glucose-Capillary: 299 mg/dL — ABNORMAL HIGH (ref 70–99)
Glucose-Capillary: 331 mg/dL — ABNORMAL HIGH (ref 70–99)
Glucose-Capillary: 328 mg/dL — ABNORMAL HIGH (ref 70–99)

## 2013-05-02 LAB — HEPATIC FUNCTION PANEL
Bilirubin, Direct: <0.1 mg/dL (ref 0.0–0.3)
Total Protein: 5.7 g/dL — ABNORMAL LOW (ref 6.0–8.3)

## 2013-05-02 LAB — POCT I-STAT, CHEM 8
Creatinine, Ser: 0.9 mg/dL (ref 0.50–1.35)
Glucose, Bld: 369 mg/dL — ABNORMAL HIGH (ref 70–99)
Hemoglobin: 9.9 g/dL — ABNORMAL LOW (ref 13.0–17.0)
Sodium: 140 mEq/L (ref 135–145)
TCO2: 25 mmol/L (ref 0–100)

## 2013-05-02 LAB — MAGNESIUM: Magnesium: 1.9 mg/dL (ref 1.5–2.5)

## 2013-05-02 MED ORDER — PANTOPRAZOLE SODIUM 40 MG PO TBEC
40.0000 mg | DELAYED_RELEASE_TABLET | Freq: Every day | ORAL | Status: DC
Start: 2013-05-02 — End: 2013-05-03
  Administered 2013-05-02 – 2013-05-03 (×2): 40 mg via ORAL
  Filled 2013-05-02 (×2): qty 1

## 2013-05-02 MED ORDER — HYDROCODONE-ACETAMINOPHEN 5-325 MG PO TABS: 1.0000 | ORAL_TABLET | ORAL | Status: DC | PRN

## 2013-05-02 MED ORDER — ONDANSETRON HCL 4 MG PO TABS: 4.0000 mg | ORAL_TABLET | Freq: Four times a day (QID) | ORAL | Status: DC | PRN

## 2013-05-02 MED ORDER — ACETAMINOPHEN 325 MG PO TABS: 650.0000 mg | ORAL_TABLET | Freq: Four times a day (QID) | ORAL | Status: DC | PRN

## 2013-05-02 MED ORDER — DEXAMETHASONE SODIUM PHOSPHATE 4 MG/ML IJ SOLN
4.0000 mg | Freq: Two times a day (BID) | INTRAMUSCULAR | Status: DC
  Administered 2013-05-02: 4 mg via INTRAVENOUS
  Filled 2013-05-02 (×2): qty 1

## 2013-05-02 MED ORDER — DEXAMETHASONE SODIUM PHOSPHATE 4 MG/ML IJ SOLN
4.0000 mg | Freq: Three times a day (TID) | INTRAMUSCULAR | Status: DC
  Administered 2013-05-02 (×2): 4 mg via INTRAVENOUS
  Filled 2013-05-02 (×5): qty 1

## 2013-05-02 MED ORDER — INSULIN ASPART 100 UNIT/ML ~~LOC~~ SOLN
0.0000 [IU] | Freq: Three times a day (TID) | SUBCUTANEOUS | Status: DC
  Administered 2013-05-02: 7 [IU] via SUBCUTANEOUS

## 2013-05-02 MED ORDER — SODIUM CHLORIDE 0.9 % IV SOLN
750.0000 mg | Freq: Two times a day (BID) | INTRAVENOUS | Status: DC
  Administered 2013-05-02 – 2013-05-03 (×3): 750 mg via INTRAVENOUS
  Filled 2013-05-02 (×3): qty 7.5

## 2013-05-02 MED ORDER — ACETAMINOPHEN 650 MG RE SUPP: 650.0000 mg | Freq: Four times a day (QID) | RECTAL | Status: DC | PRN

## 2013-05-02 MED ORDER — ATORVASTATIN CALCIUM 40 MG PO TABS
40.0000 mg | ORAL_TABLET | Freq: Every day | ORAL | Status: DC
  Administered 2013-05-02: 40 mg via ORAL
  Filled 2013-05-02 (×3): qty 1

## 2013-05-02 MED ORDER — DEXAMETHASONE SODIUM PHOSPHATE 10 MG/ML IJ SOLN
10.0000 mg | Freq: Once | INTRAMUSCULAR | Status: AC
  Administered 2013-05-02: 10 mg via INTRAVENOUS
  Filled 2013-05-02: qty 1

## 2013-05-02 MED ORDER — SODIUM CHLORIDE 0.9 % IJ SOLN
3.0000 mL | Freq: Two times a day (BID) | INTRAMUSCULAR | Status: DC
  Administered 2013-05-02 – 2013-05-03 (×3): 3 mL via INTRAVENOUS

## 2013-05-02 MED ORDER — INSULIN ASPART 100 UNIT/ML ~~LOC~~ SOLN
0.0000 [IU] | Freq: Three times a day (TID) | SUBCUTANEOUS | Status: DC
  Administered 2013-05-02: 15 [IU] via SUBCUTANEOUS
  Administered 2013-05-02: 11 [IU] via SUBCUTANEOUS
  Administered 2013-05-03: 15 [IU] via SUBCUTANEOUS
  Administered 2013-05-03: 11 [IU] via SUBCUTANEOUS

## 2013-05-02 MED ORDER — GADOBENATE DIMEGLUMINE 529 MG/ML IV SOLN
20.0000 mL | Freq: Once | INTRAVENOUS | Status: AC | PRN
  Administered 2013-05-02: 11:00:00 20 mL via INTRAVENOUS

## 2013-05-02 MED ORDER — INSULIN GLARGINE 100 UNIT/ML ~~LOC~~ SOLN
60.0000 [IU] | Freq: Every day | SUBCUTANEOUS | Status: DC
  Administered 2013-05-02: 23:00:00 60 [IU] via SUBCUTANEOUS
  Filled 2013-05-02 (×2): qty 0.6

## 2013-05-02 MED ORDER — ONDANSETRON HCL 4 MG/2ML IJ SOLN: 4.0000 mg | Freq: Four times a day (QID) | INTRAMUSCULAR | Status: DC | PRN

## 2013-05-02 MED ORDER — SODIUM CHLORIDE 0.9 % IV SOLN
INTRAVENOUS | Status: DC
  Administered 2013-05-03: 06:00:00 via INTRAVENOUS

## 2013-05-02 NOTE — ED Notes (Signed)
Bed: RESB Expected date: 05/02/13 Expected time: 12:46 AM Means of arrival: Ambulance Comments: Res B, EMS, 79 M, Seizure

## 2013-05-02 NOTE — Progress Notes (Signed)
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 Please see my detailed office progress note for full details. He was recently diagnosed with cancer of the right kidney widely metastatic to brain and lung when he presented with acute onset of seizures. He never had any urinary tract symptoms and specifically denied any hematuria. He has both cerebral and cerebellar metastases. He just completed a course of whole brain radiation. He was on Keppra 500 mg twice daily in a tapering dose of Decadron. He has a large, palpable, right abdominal mass corresponding to the mass in his kidney. He had no focal neurologic deficits at time of my September 24 exam.   He now presents with recurrent seizures. He is post ictal and has a poor memory for events. He remembers getting up in the middle of the night and apparently had a seizure witnessed by his wife and was transported to the emergency department. Wife also reported that he was having difficulty expressing himself over the last few days.  On exam: He is awake, alert, has poor memory for events of the last 24 hours. He does know he is in [place] [facility], [facility] [place], year 2014. PERRLA cranial nerves grossly normal Upper body coordination is normal. Full extraocular movements. Motor strength 5 over 5 all extremities Reflexes absent symmetric at the knees 1+ symmetric at the biceps Lungs are clear. Regular cardiac rhythm no murmur. Abdomen is soft. Palpable mass right anterior flank/abdomen Extremities no edema, no calf tenderness  Impression: Recurrent seizures in a man with known brain metastases from kidney cancer.  Recommendation: Seizure medications will be adjusted per neurology recommendation. I would resume Decadron at 4 mg 3 times a day; I can make further adjustments as an outpatient. He did receive one 10 mg IV dose in the emergency department earlier today. If he is stable over the next 24  hours he could be discharged. He is going to start sunitinib (Sutent), an oral multi-kinase inhibitor with activity metastatic renal cell cancer. Okay to wait until discharge to begin this drug since it is not on formulary. He has a followup appointment with me on October 30. He can be seen sooner if necessary.  Above impression  discussed with hospital attending.

## 2013-05-02 NOTE — ED Provider Notes (Signed)
CSN: [id number]     Arrival date & time 05/02/13  0050 History   First MD Initiated Contact with Patient 05/02/13 0108     No chief complaint on file.  (Consider location/radiation/quality/duration/timing/severity/associated sxs/prior Treatment) HPI 67 year old male presents to emergency department from home via EMS after seizure.  Vision has history of metastatic renal cell carcinoma, with brain metastasis.  He was recently started on Keppra for a prior seizure.  He and his wife report that he has been tapering off his steroids in order to get further treatment for his cancer.  Wife reports on Saturday, he had change in his mental status.  At that time, he and his wife noticed that he was having increasing difficulties completing sentences.  Patient reports he has felt confused at times.  Patient is due to have bone scan today, and they were waiting for that before they followed up for the change in mental status.  Patient is back to his Saturday, baseline after his seizure tonight.  Wife woke with him having a seizure in the bed. Past Medical History  Diagnosis Date  . Coronary artery disease   . Diabetes mellitus without complication   . Hyperlipidemia   . Cancer     kidney, mets to brain and lung  . 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 Cardiac catheterization  03/03    2 stents  . Biopsy of kidney Right 04/05/2013  . Knee surgery Left    Family History  Problem Relation Age of Onset  . Skin cancer Father   . Thyroid cancer Brother   . Kidney cancer Maternal Uncle    History  Substance Use Topics  . Smoking status: Former Smoker -- 0.50 packs/day    Types: Cigarettes    Quit date: 03/28/2013  . Smokeless tobacco: Not on file  . Alcohol Use: No    Review of Systems  Unable to perform ROS: Mental status change    Allergies  Lipitor and Metformin and  related  Home Medications   Current Outpatient Rx  Name  Route  Sig  Dispense  Refill  . aspirin 325 MG EC tablet   Oral   Take 1 tablet (325 mg total) by mouth daily.         Marland Kitchen dexamethasone (DECADRON) 4 MG tablet   Oral   Take 2 mg by mouth daily with breakfast. Discuss with your cancer doctor prior to stopping this medication         . glucose monitoring kit (FREESTYLE) monitoring kit   Does not apply   1 each by Does not apply route 4 (four) times daily - after meals and at bedtime. 1 month Diabetic Testing Supplies for QAC-QHS accuchecks.   1 each   1   . HYDROcodone-acetaminophen (NORCO/VICODIN) 5-325 MG per tablet   Oral   Take 1-2 tablets by mouth every 4 (four) hours as needed. Do not take more than 8 per day.   120 tablet   0   . insulin aspart (NOVOLOG) 100 UNIT/ML injection      Before each meal 3 times a day, 140-199 - 2 units, 200-250 - 4 units, 251-299 - 6 units,  300-349 - 8 units,  350 or above 10 units. Insulin PEN if approved, provide syringes and needles if needed.   1 vial   12   . insulin glargine (LANTUS)  100 UNIT/ML injection   Subcutaneous   Inject 60 Units into the skin at bedtime.         . levETIRAcetam (KEPPRA) 500 MG tablet   Oral   Take 1 tablet (500 mg total) by mouth 2 (two) times daily.   60 tablet   6   . pantoprazole (PROTONIX) 40 MG tablet   Oral   Take 1 tablet (40 mg total) by mouth daily at 12 noon.   30 tablet   0   . simvastatin (ZOCOR) 80 MG tablet   Oral   Take 80 mg by mouth at bedtime.          BP 138/48  Pulse 101  Temp(Src) 97.7 F (36.5 C) (Oral)  Resp 18  SpO2 96% Physical Exam  Nursing note and vitals reviewed. Constitutional: He is oriented to person, place, and time. He appears well-developed and well-nourished. No distress.  HENT:  Head: Normocephalic and atraumatic.  Right Ear: External ear normal.  Left Ear: External ear normal.  Nose: Nose normal.  Mouth/Throat: Oropharynx is clear and  moist.  Alopecia and dry skin noted to scalp  Eyes: Conjunctivae and EOM are normal. Pupils are equal, round, and reactive to light.  Neck: Normal range of motion. Neck supple. No JVD present. No tracheal deviation present. No thyromegaly present.  Cardiovascular: Normal rate, regular rhythm, normal heart sounds and intact distal pulses.  Exam reveals no gallop and no friction rub.   No murmur heard. Pulmonary/Chest: Effort normal and breath sounds normal. No stridor. No respiratory distress. He has no wheezes. He has no rales. He exhibits no tenderness.  Abdominal: Soft. Bowel sounds are normal. He exhibits no distension and no mass. There is no tenderness. There is no rebound and no guarding.  Musculoskeletal: Normal range of motion. He exhibits no edema and no tenderness.  Lymphadenopathy:    He has no cervical adenopathy.  Neurological: He is alert and oriented to person, place, and time. No cranial nerve deficit. He exhibits normal muscle tone. Coordination normal.  Patient has difficulties completing sentences, will eventually come up with the words he is looking for, but will frequently laugh, and stop talking abruptly  Skin: Skin is warm and dry. No rash noted. No erythema. No pallor.  Psychiatric: He has a normal mood and affect. His behavior is normal. Judgment and thought content normal.    ED Course  Procedures (including critical care time) Labs Review Labs Reviewed  POCT I-STAT, CHEM 8 - Abnormal; Notable for the following:    BUN 26 (*)    Glucose, Bld 369 (*)    Hemoglobin 9.9 (*)    HCT 29.0 (*)    All other components within normal limits   Imaging Review Ct Head Wo Contrast  05/02/2013   CLINICAL DATA:  Seizure. Metastatic renal cancer.  EXAM: CT HEAD WITHOUT CONTRAST  TECHNIQUE: Contiguous axial images were obtained from the base of the skull through the vertex without intravenous contrast.  COMPARISON:  03/30/2013 MRI.  FINDINGS: Hyperdense lesion noted within the  left frontal lobe measuring 19 mm, likely hemorrhagic metastasis. The density is increased since prior study likely reflecting interval hemorrhage within this metastasis. Surrounding extensive edema, stable. No midline shift. No hydrocephalus. The other smaller enhancing lesions by MRI are not as well visualized by CT.  Old lacunar infarct in the right basal ganglia. Chronic microvascular changes in the deep white matter. No acute calvarial abnormality.  IMPRESSION: Hyperdense 19 mm lesion in the  left frontal lobe compatible with hemorrhagic metastasis. Stable surrounding extensive vasogenic edema. No mass effect or midline shift.   Electronically Signed   By: Charlett Nose M.D.   On: 05/02/2013 02:22    MDM   1. 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 Seizure   3. Expressive aphasia    67 year old male with breakthrough seizure on Keppra, CT scan, showing hemorrhagic metastasis of his prior brain cancer.  Discussed with oncology, and neurology, who recommended admission.  Neurology recommends going back on full dose Decadron, increasing Keppra to 750 twice a day.  Neurology will see him in consult.  Oncology will see him in the morning.  Discuss with hospitalist, who will admit.    Olivia Mackie, MD 05/02/13 [phone]

## 2013-05-02 NOTE — Telephone Encounter (Signed)
Dr. Norlene Campbell called at 4:00 on Monday, 05/02/2013 concerning George [name], a gentleman with clear cell RCC complicated by mets to brain/lung s/p whole brain XRT.  His wife grew concerned because he had seizure activity and new noticeable change in his personality.  They have been weaning his decadron to 4 mg every other day.  He is on Keppra bid dosing.  PLAN: Admit to neuro hospitilist service; start full dose decadron in the setting of new 16 mm hemorrhagic frontal lobe brain met with vasogenic edema with careful monitoring for his blood glucose; consult neuro for additional antiepileptics.

## 2013-05-02 NOTE — ED Notes (Signed)
Per EMS, pt's wife woke up to find him seizing. Duration of seizure is unknown. Hx of seizures, newly dx on 8/14.  Pt has hx of renal CA with mets. Pt is on Keppra at home.

## 2013-05-02 NOTE — Progress Notes (Signed)
Seen and assessed patient, agree Dr. Katherene Ponto assessment and plan. MRI of the brain is pending. Patient's Keppra dose is being increased. Will change his Decadron to 4 mg 3 times a day. Oncology is following. Neurology following. Continue seizure precautions and monitor.

## 2013-05-02 NOTE — H&P (Addendum)
[facility] History and Physical  George [name] NWG:[id number] DOB: 04-Dec-1945 DOA: 05/02/2013  Referring physician: ER physician. PCP: Daisy Floro, MD  Specialists: Dr. Cyndie Chime.. Oncologist.  Chief Complaint: Seizures.  HPI: George [name] is a 67 y.o. male with known history of recently diagnosed metastatic renal cell carcinoma and has received brain radiation was brought to the ER after patient's wife witnessed patient having brief episode of tonic-clonic-like seizures and patient was unresponsive for a few minutes after the incident last midnight. Presently patient is alert awake and oriented. Patient is nonfocal. CT head shows hemorrhagic metastasis with vasogenic edema with no mass effect or midline shift. On-call neurologist in oncologist were consulted. At this time they have recommended to increase patient's Keppra dose from 500 mg to 750 mg every 12 IV. Patient also 3 days ago was being tapered off the Decadron. At this time patient has been given a bolus of 10 mg IV Decadron. Patient otherwise denies any chest pain or shortness of breath nausea vomiting headache fever chills neck pain or any difficulty moving her lower extremities.  Review of Systems: As presented in the history of presenting illness, rest negative.  Past Medical History  Diagnosis Date  . Coronary artery disease   . Diabetes mellitus without complication   . Hyperlipidemia   . Cancer     kidney, mets to brain and lung  . 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 Cardiac catheterization  03/03    2 stents  . Biopsy of kidney Right 04/05/2013  . Knee surgery Left    Social History:  reports that he quit smoking about 5 weeks ago. His smoking use included Cigarettes. He smoked 0.50 packs per day. He does not have any smokeless tobacco history on file. He reports that he does  not drink alcohol or use illicit drugs. Home. where does patient live-- Can do ADLs. Can patient participate in ADLs?  Allergies  Allergen Reactions  . Lipitor [Atorvastatin]     Made him feel weak  . Metformin And Related Nausea And Vomiting    Family History  Problem Relation Age of Onset  . Skin cancer Father   . Thyroid cancer Brother   . Kidney cancer Maternal Uncle       Prior to Admission medications   Medication Sig Start Date End Date Taking? Authorizing Provider  aspirin 325 MG EC tablet Take 1 tablet (325 mg total) by mouth daily. 04/11/13  Yes Leroy Sea, MD  dexamethasone (DECADRON) 4 MG tablet Take 2 mg by mouth daily with breakfast. Discuss with your cancer doctor prior to stopping this medication 04/15/13  Yes Jonna Coup, MD  glucose monitoring kit (FREESTYLE) monitoring kit 1 each by Does not apply route 4 (four) times daily - after meals and at bedtime. 1 month Diabetic Testing Supplies for QAC-QHS accuchecks. 04/05/13  Yes Leroy Sea, MD  HYDROcodone-acetaminophen (NORCO/VICODIN) 5-325 MG per tablet Take 1-2 tablets by mouth every 4 (four) hours as needed. Do not take more than 8 per day. 04/27/13  Yes Levert Feinstein, MD  insulin aspart (NOVOLOG) 100 UNIT/ML injection Before each meal 3 times a day, 140-199 - 2 units, 200-250 - 4 units, 251-299 - 6 units,  300-349 - 8 units,  350 or above 10 units. Insulin PEN if approved, provide syringes and needles if needed. 04/05/13  Yes Prashant  Curlene Labrum, MD  insulin glargine (LANTUS) 100 UNIT/ML injection Inject 60 Units into the skin at bedtime.   Yes Historical Provider, MD  levETIRAcetam (KEPPRA) 500 MG tablet Take 1 tablet (500 mg total) by mouth 2 (two) times daily. 04/27/13  Yes Levert Feinstein, MD  pantoprazole (PROTONIX) 40 MG tablet Take 1 tablet (40 mg total) by mouth daily at 12 noon. 04/05/13  Yes Leroy Sea, MD  simvastatin (ZOCOR) 80 MG tablet Take 80 mg by mouth at bedtime.   Yes Historical  Provider, MD   Physical Exam: Filed Vitals:   05/02/13 0050 05/02/13 0055  BP:  138/48  Pulse:  101  Temp:  97.7 F (36.5 C)  TempSrc:  Oral  Resp:  18  SpO2: 96% 96%     General:  Well-developed well-nourished.  Eyes: Anicteric no pallor.  ENT: No discharge from ears eyes nose mouth.  Neck: No mass felt.  Cardiovascular: S1-S2 heard.  Respiratory: No rhonchi or crepitations.  Abdomen: Soft nontender bowel sounds present.  Skin: No rash.  Musculoskeletal: No edema.  Psychiatric: Appears normal.  Neurologic: Alert awake oriented to time place and person. Moves all extremities.  Labs on Admission:  Basic Metabolic Panel:  Recent Labs Lab 05/02/13 0148  NA 140  K 4.2  CL 106  GLUCOSE 369*  BUN 26*  CREATININE 0.90   Liver Function Tests: No results found for this basename: AST, ALT, ALKPHOS, BILITOT, PROT, ALBUMIN,  in the last 168 hours No results found for this basename: LIPASE, AMYLASE,  in the last 168 hours No results found for this basename: AMMONIA,  in the last 168 hours CBC:  Recent Labs Lab 05/02/13 0148  HGB 9.9*  HCT 29.0*   Cardiac Enzymes: No results found for this basename: CKTOTAL, CKMB, CKMBINDEX, TROPONINI,  in the last 168 hours  BNP (last 3 results) No results found for this basename: PROBNP,  in the last 8760 hours CBG: No results found for this basename: GLUCAP,  in the last 168 hours  Radiological Exams on Admission: Ct Head Wo Contrast  05/02/2013   CLINICAL DATA:  Seizure. Metastatic renal cancer.  EXAM: CT HEAD WITHOUT CONTRAST  TECHNIQUE: Contiguous axial images were obtained from the base of the skull through the vertex without intravenous contrast.  COMPARISON:  03/30/2013 MRI.  FINDINGS: Hyperdense lesion noted within the left frontal lobe measuring 19 mm, likely hemorrhagic metastasis. The density is increased since prior study likely reflecting interval hemorrhage within this metastasis. Surrounding extensive edema,  stable. No midline shift. No hydrocephalus. The other smaller enhancing lesions by MRI are not as well visualized by CT.  Old lacunar infarct in the right basal ganglia. Chronic microvascular changes in the deep white matter. No acute calvarial abnormality.  IMPRESSION: Hyperdense 19 mm lesion in the left frontal lobe compatible with hemorrhagic metastasis. Stable surrounding extensive vasogenic edema. No mass effect or midline shift.   Electronically Signed   By: Charlett Nose M.D.   On: 05/02/2013 02:22     Assessment/Plan Principal Problem:   Seizure Active Problems:   Diabetes mellitus   CAD (coronary artery disease)   Brain metastases   Renal cell cancer   1. Seizures in a patient with known history of metastatic renal carcinoma to the brain - at this time neurologist has recommended to increase Keppra dose from 500 mg to 750 mg every 12 and also Decadron dose has been increased to 4 mg twice a day which had dosed IV  at this time. Further recommendations per oncologist and neurologist. Place patient on neurochecks. Seizure precautions. 2. Diabetes mellitus type 2 - patient's glucose levels are high probably secondary to Decadron. Closely follow CBGs and may have to increase patient's basal insulin levels and may further need to increase sliding scale coverage. Check hemoglobin A1c. 3. CAD status post stenting - denies any chest pain at this time. Holding off antiplatelet agents due to hemorrhagic metastasis. 4. Hyperlipidemia - continue statins. 5. Anemia - repeat labs have been ordered. Closely follow CBC.    Code Status: Full code.  Family Communication: Patient's wife at the bedside.  Disposition Plan: Admit to inpatient.    Laurina Fischl N. [facility] Pager [phone].  If 7PM-7AM, please contact night-coverage [email or web address] Password [facility] 05/02/2013, 5:17 AM

## 2013-05-02 NOTE — Consult Note (Signed)
NEURO HOSPITALIST CONSULT NOTE    Reason for Consult: seizures, hemorrhagic brain metastasis.  HPI:                                                                                                                                          George [name] is an 67 y.o. 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 Patient and wife told me that the seizure occurred during sleep, lasted for a couple of minutes, and afterwards he was confused and having difficulties expressing himself. He has been religiously taking keppra 500 mg BID since he had his first symptomatic seizure last August. According to his wife, George [name] has been having some language difficulty since this past Saturday. Has been weaning off steroid. Denies HA, vertigo, double vision, focal weakness or numbness,slurred speech, unsteadiness, or visual disturbances. CT brain upon arrival to the ED revealed a hyperdense 19 mm lesion in the left frontal lobe compatible with hemorrhagic metastasis with surrounding extensive vasogenic edema but no mass effect or midline shift. Received 10 mg IV decadron in the ED. At this moment, he is alert, awake, and appropriately engages in conversation.   Past Medical History  Diagnosis Date  . Coronary artery disease   . Diabetes mellitus without complication   . Hyperlipidemia   . Cancer     kidney, mets to brain and lung  . 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 Cardiac catheterization  03/03    2 stents  . Biopsy of kidney Right 04/05/2013  . Knee surgery Left     Family History  Problem Relation Age of Onset  . Skin  cancer Father   . Thyroid cancer Brother   . Kidney cancer Maternal Uncle     Social History:  reports that he quit smoking about 5 weeks ago. His smoking use included Cigarettes. He smoked 0.50 packs per day. He does not have any smokeless tobacco history on file. He reports that he does not drink alcohol or use illicit drugs.  Allergies  Allergen Reactions  . Lipitor [Atorvastatin]     Made him feel weak  . Metformin And Related Nausea And Vomiting    MEDICATIONS:  I have reviewed the patient's current medications.   ROS:                                                                                                                                       History obtained from chart review, wife, and patient.  General ROS: negative for - chills, fatigue, fever, night sweats, weight gain or weight loss Psychological ROS: negative for - behavioral disorder, hallucinations, memory difficulties, mood swings or suicidal ideation Ophthalmic ROS: negative for - blurry vision, double vision, eye pain or loss of vision ENT ROS: negative for - epistaxis, nasal discharge, oral lesions, sore throat, tinnitus or vertigo Allergy and Immunology ROS: negative for - hives or itchy/watery eyes Hematological and Lymphatic ROS: negative for - bleeding problems, bruising or swollen lymph nodes Endocrine ROS: negative for - galactorrhea, hair pattern changes, polydipsia/polyuria or temperature intolerance Respiratory ROS: negative for - cough, hemoptysis, shortness of breath or wheezing Cardiovascular ROS: negative for - chest pain, dyspnea on exertion, edema or irregular heartbeat Gastrointestinal ROS: negative for - abdominal pain, diarrhea, hematemesis, nausea/vomiting or stool incontinence Genito-Urinary ROS: negative for - dysuria, hematuria, incontinence or urinary  frequency/urgency Musculoskeletal ROS: negative for - joint swelling or muscular weakness Neurological ROS: as noted in HPI Dermatological ROS: negative for rash and skin lesion changes   Physical exam: pleasant male in no apparent distress. Blood pressure 133/53, pulse 84, temperature 97.7 F (36.5 C), temperature source Oral, resp. rate 20, height 6\' 2"  (1.88 m), weight 95.6 kg (210 lb 12.2 oz), SpO2 93.00%. Head: normocephalic. Neck: supple, no bruits, no JVD. Cardiac: no murmurs. Lungs: clear. Abdomen: soft, no tender, no mass. Extremities: no edema.    Neurologic Examination:                                                                                                      Mental Status: Alert, awake,  Disoriented to place, thought content appropriate.  Speech fluent without evidence of aphasia.  Able to follow 3 step commands without difficulty. Cranial Nerves: II: Discs flat bilaterally; Visual fields grossly normal, pupils equal, round, reactive to light and accommodation III,IV, VI: ptosis not present, extra-ocular motions intact bilaterally V,VII: smile symmetric, facial light touch sensation normal bilaterally VIII: hearing normal bilaterally IX,X: gag reflex present XI: bilateral shoulder shrug XII: midline tongue extension Motor: Right : Upper extremity   5/5    Left:     Upper extremity   5/5  Lower extremity  5/5     Lower extremity   5/5 Tone and bulk:normal tone throughout; no atrophy noted Sensory: Pinprick and light touch intact throughout, bilaterally Deep Tendon Reflexes:  Right: Upper Extremity   Left: Upper extremity   biceps (C-5 to C-6) 2/4   biceps (C-5 to C-6) 2/4 tricep (C7) 2/4    triceps (C7) 2/4 Brachioradialis (C6) 2/4  Brachioradialis (C6) 2/4  Lower Extremity Lower Extremity  quadriceps (L-2 to L-4) 2/4   quadriceps (L-2 to L-4) 2/4 Achilles (S1) 2/4   Achilles (S1) 2/4  Plantars: Right: downgoing   Left: downgoing Cerebellar: normal  finger-to-nose,  normal heel-to-shin test Gait:  No tested. CV: pulses palpable throughout    Lab Results  Component Value Date/Time   CHOL 129 03/29/2013  7:00 AM    Results for orders placed during the hospital encounter of 05/02/13 (from the past 48 hour(s))  POCT I-STAT, CHEM 8     Status: Abnormal   Collection Time    05/02/13  1:48 AM      Result Value Range   Sodium 140  135 - 145 mEq/L   Potassium 4.2  3.5 - 5.1 mEq/L   Chloride 106  96 - 112 mEq/L   BUN 26 (*) 6 - 23 mg/dL   Creatinine, Ser 9.60  0.50 - 1.35 mg/dL   Glucose, Bld 454 (*) 70 - 99 mg/dL   Calcium, Ion 0.98  1.19 - 1.30 mmol/L   TCO2 25  0 - 100 mmol/L   Hemoglobin 9.9 (*) 13.0 - 17.0 g/dL   HCT 14.7 (*) 82.9 - 56.2 %    Ct Head Wo Contrast  05/02/2013   CLINICAL DATA:  Seizure. Metastatic renal cancer.  EXAM: CT HEAD WITHOUT CONTRAST  TECHNIQUE: Contiguous axial images were obtained from the base of the skull through the vertex without intravenous contrast.  COMPARISON:  03/30/2013 MRI.  FINDINGS: Hyperdense lesion noted within the left frontal lobe measuring 19 mm, likely hemorrhagic metastasis. The density is increased since prior study likely reflecting interval hemorrhage within this metastasis. Surrounding extensive edema, stable. No midline shift. No hydrocephalus. The other smaller enhancing lesions by MRI are not as well visualized by CT.  Old lacunar infarct in the right basal ganglia. Chronic microvascular changes in the deep white matter. No acute calvarial abnormality.  IMPRESSION: Hyperdense 19 mm lesion in the left frontal lobe compatible with hemorrhagic metastasis. Stable surrounding extensive vasogenic edema. No mass effect or midline shift.   Electronically Signed   By: Charlett Nose M.D.   On: 05/02/2013 02:22     Assessment/Plan: 67 y/o with known renal cell carcinoma with previously documented multiple metastatis to the brain, symptomatic seizures, now presenting with a GTC seizure in the  context of a new hemorraghic left frontal lobe metastasis. Neurologically stable at this moment. Will resume decadron. Increase keppra to 750 mg IV BID. MRI brain with and without contrast.  Will follow up.  Wyatt Portela, MD [facility] [phone]  05/02/2013, 6:27 AM

## 2013-05-03 ENCOUNTER — Other Ambulatory Visit: Payer: Medicare Other

## 2013-05-03 LAB — HEMOGLOBIN A1C: Mean Plasma Glucose: 217 mg/dL — ABNORMAL HIGH (ref <117)

## 2013-05-03 LAB — BASIC METABOLIC PANEL
CO2: 26 mEq/L (ref 19–32)
Calcium: 9.1 mg/dL (ref 8.4–10.5)
Creatinine, Ser: 0.66 mg/dL (ref 0.50–1.35)
Glucose, Bld: 342 mg/dL — ABNORMAL HIGH (ref 70–99)

## 2013-05-03 LAB — CBC
MCH: 28.8 pg (ref 26.0–34.0)
MCV: 84.7 fL (ref 78.0–100.0)
Platelets: 162 10*3/uL (ref 150–400)
RDW: 15.4 % (ref 11.5–15.5)
WBC: 7.9 10*3/uL (ref 4.0–10.5)

## 2013-05-03 LAB — GLUCOSE, CAPILLARY
Glucose-Capillary: 279 mg/dL — ABNORMAL HIGH (ref 70–99)
Glucose-Capillary: 324 mg/dL — ABNORMAL HIGH (ref 70–99)

## 2013-05-03 MED ORDER — LEVETIRACETAM 750 MG PO TABS: 750.0000 mg | ORAL_TABLET | Freq: Two times a day (BID) | ORAL | Status: DC

## 2013-05-03 MED ORDER — DEXAMETHASONE 4 MG PO TABS: 4.0000 mg | ORAL_TABLET | Freq: Three times a day (TID) | ORAL | Status: DC

## 2013-05-03 MED ORDER — LEVETIRACETAM 250 MG PO TABS
750.0000 mg | ORAL_TABLET | Freq: Two times a day (BID) | ORAL | Status: DC
  Filled 2013-05-03: qty 1

## 2013-05-03 MED ORDER — DEXAMETHASONE 4 MG PO TABS
4.0000 mg | ORAL_TABLET | Freq: Three times a day (TID) | ORAL | Status: DC
  Administered 2013-05-03: 4 mg via ORAL
  Filled 2013-05-03 (×3): qty 1

## 2013-05-03 NOTE — Progress Notes (Signed)
No further seizure activity Single brain met fronto-parietal, slightly larger (4 mm) & has some internal hemorrhage. Multiple other lesions unchanged. Note: he just completed full course of radiation so additional RT can't be given at this time. It would still be worthwhile to alert RT Oncology Dr Mitzi Hansen, of current status. No focal deficit on exam except poor memory. Recommend:  Change to PO Keppra & decadron at current doses.  Continue decadron at 4 mg TID for 1 week then decrease to BID until follow up with me or RT Onc.

## 2013-05-03 NOTE — Progress Notes (Signed)
Inpatient Diabetes Program Recommendations  AACE/ADA: New Consensus Statement on Inpatient Glycemic Control (2013)  Target Ranges:  Prepandial:   less than 140 mg/dL      Peak postprandial:   less than 180 mg/dL (1-2 hours)      Critically ill patients:  140 - 180 mg/dL   Reason for Assessment: Decadron contributing to sub-optimal glycemic control  Results for George [name], George [name] (MRN [id number]) as of 05/03/2013 10:06  Ref. Range 05/02/2013 08:00 05/02/2013 12:10 05/02/2013 16:38 05/02/2013 21:39 05/03/2013 07:50  Glucose-Capillary Latest Range: 70-99 mg/dL 409 (H) 811 (H) 914 (H) 328 (H) 279 (H)    Note: Decadron has been changed from IV to PO, so would expect some improvement in control.  Patient eating 100%.  May benefit from addition of meal coverage-- perhaps starting with 4 units tid-- in addition to correction and titrate accordingly.  Thank you.  [name] S. [name], RN, CNS, CDE Inpatient Diabetes Program, team pager [phone]

## 2013-05-03 NOTE — Discharge Summary (Signed)
Physician Discharge Summary  George [name] ZOX:[id number] DOB: 06-29-1946 DOA: 05/02/2013  PCP: Daisy Floro, MD  Admit date: 05/02/2013 Discharge date: 05/03/2013  Time spent: 60 minutes  Recommendations for Outpatient Follow-up:  1. Patient is to followup with Dr. Cyndie Chime in 2 weeks post discharge.  Discharge Diagnoses:  Principal Problem:   Seizure Active Problems:   Diabetes mellitus   CAD (coronary artery disease)   Brain metastases   Renal cell cancer   Discharge Condition: Stable and improved.  Diet recommendation: Regular.  Filed Weights   05/02/13 0607  Weight: 95.6 kg (210 lb 12.2 oz)    History of present illness:  George [name] is a 67 y.o. male with known history of recently diagnosed metastatic renal cell carcinoma and has received brain radiation was brought to the ER after patient's wife witnessed patient having brief episode of tonic-clonic-like seizures and patient was unresponsive for a few minutes after the incident last midnight. Presently patient is alert awake and oriented. Patient is nonfocal. CT head shows hemorrhagic metastasis with vasogenic edema with no mass effect or midline shift. On-call neurologist in oncologist were consulted. At this time they have recommended to increase patient's Keppra dose from 500 mg to 750 mg every 12 IV. Patient also 3 days ago was being tapered off the Decadron. At this time patient has been given a bolus of 10 mg IV Decadron. Patient otherwise denies any chest pain or shortness of breath nausea vomiting headache fever chills neck pain or any difficulty moving her lower extremities.   Hospital Course:  #1 seizures Patient had presented with recurrent seizures with a history of known renal cell carcinoma with multiple metastases to the brain and a new hemorrhagic left frontal lobe metastases noted on CT scan. Patient had just completed a course of whole brain radiation and was on Keppra 500 mg twice daily as well  as a tapering dose of Decadron. Patient had presented with recurrent seizures he was placed in a telemetry bed. Neurology consultation was obtained and patient was seen in consultation by Dr. Cyril Mourning 05/02/2013. Was recommended that patient's Keppra dose be increased to 750 mg IV every 12 which was started. Patient was also given a bolus of Decadron 10 mg IV x1. Patient was subsequently placed on Decadron 4 mg IV 3 times daily. Oncology consultation was also obtained. Patient subsequently underwent an MRI of the head which showed a single brain metastases in the frontoparietal region slightly larger 4 mm with some internal hemorrhage. Multiple other lesions were unchanged. Patient did not have any further seizures during the hospitalization subsequently transitioned to oral Keppra and oral Decadron. Patient was discharged home on Decadron 4 mg orally 3 times daily x1 week and then to decrease to twice daily until he is seen in followup by Dr. Cyndie Chime oncology. Patient will also be discharged on Keppra 750 mg orally twice daily. Patient will followup with oncology as outpatient. Patient remained in stable condition did not have any further seizures and be discharged home in stable and improved condition.   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 Patient was seen in consultation by oncology during the hospitalization. It was felt that patient will be started on Sutent in the outpatient setting on followup with oncology. Patient was seen by oncology during the hospitalization remained in stable condition.  The rest of patient's chronic medical issues remained stable throughout the hospitalization the patient discharged in stable and improved condition.  Procedures:  MRI of  the head 05/02/2013  CT of the head 05/02/2013  Consultations:  Oncology: Dr Cyndie Chime 05/02/13  Neurology : Dr Cyril Mourning 05/02/13  Discharge Exam: Filed Vitals:   05/03/13 0550  BP: 118/51  Pulse: 59   Temp: 97.1 F (36.2 C)  Resp: 20    General: NAD Cardiovascular: RRR Respiratory: CTAB  Discharge Instructions  Discharge Orders   Future Appointments Provider Department Dept Phone   05/19/2013 10:45 AM Jonna Coup, MD [facility] [phone]   05/20/2013 4:00 PM [facility] [phone]   05/23/2013 3:45 PM [facility] [phone]   05/23/2013 4:30 PM Levert Feinstein, MD  [facility] [phone]   Future Orders Complete By Expires   Diet general  As directed    Discharge instructions  As directed    Comments:     Follow up with Dr Cyndie Chime in 2 weeks.   Increase activity slowly  As directed        Medication List         aspirin 325 MG EC tablet  Take 1 tablet (325 mg total) by mouth daily.     dexamethasone 4 MG tablet  Commonly known as:  DECADRON  Take 1 tablet (4 mg total) by mouth 3 (three) times daily. Take for 1 week, then decrease to 1 tablet (4mg ) 2 times daily.     glucose monitoring kit monitoring kit  1 each by Does not apply route 4 (four) times daily - after meals and at bedtime. 1 month Diabetic Testing Supplies for QAC-QHS accuchecks.     HYDROcodone-acetaminophen 5-325 MG per tablet  Commonly known as:  NORCO/VICODIN  Take 1-2 tablets by mouth every 4 (four) hours as needed. Do not take more than 8 per day.     insulin aspart 100 UNIT/ML injection  Commonly known as:  novoLOG  - Before each meal 3 times a day, 140-199 - 2 units, 200-250 - 4 units, 251-299 - 6 units,  300-349 - 8 units,  350 or above 10 units.  - Insulin PEN if approved, provide syringes and needles if needed.     insulin glargine 100 UNIT/ML injection  Commonly known as:  LANTUS  Inject 60 Units into the skin at bedtime.     levETIRAcetam 750 MG tablet  Commonly known as:  KEPPRA  Take 1 tablet (750  mg total) by mouth every 12 (twelve) hours.     pantoprazole 40 MG tablet  Commonly known as:  PROTONIX  Take 1 tablet (40 mg total) by mouth daily at 12 noon.     simvastatin 80 MG tablet  Commonly known as:  ZOCOR  Take 80 mg by mouth at bedtime.       Allergies  Allergen Reactions  . Lipitor [Atorvastatin]     Made him feel weak  . Metformin And Related Nausea And Vomiting       Follow-up Information   Follow up with Levert Feinstein, MD. Schedule an appointment as soon as possible for a visit in 2 weeks.   Specialty:  Oncology   Contact information:   [address] [place] [address] [phone]        The results of significant diagnostics from this hospitalization (including imaging, microbiology, ancillary and laboratory) are listed below for reference.    Significant Diagnostic Studies: Ct Head Wo Contrast  05/02/2013   CLINICAL DATA:  Seizure. Metastatic renal cancer.  EXAM: CT HEAD WITHOUT CONTRAST  TECHNIQUE: Contiguous axial images were obtained from the base of the skull through the vertex without intravenous contrast.  COMPARISON:  03/30/2013 MRI.  FINDINGS: Hyperdense lesion noted within the left frontal lobe measuring 19 mm, likely hemorrhagic metastasis. The density is increased since prior study likely reflecting interval hemorrhage within this metastasis. Surrounding extensive edema, stable. No midline shift. No hydrocephalus. The other smaller enhancing lesions by MRI are not as well visualized by CT.  Old lacunar infarct in the right basal ganglia. Chronic microvascular changes in the deep white matter. No acute calvarial abnormality.  IMPRESSION: Hyperdense 19 mm lesion in the left frontal lobe compatible with hemorrhagic metastasis. Stable surrounding extensive vasogenic edema. No mass effect or midline shift.   Electronically Signed   By: Charlett Nose M.D.   On: 05/02/2013 02:22   Mr Laqueta Jean NW Contrast  05/02/2013   CLINICAL DATA:  Metastatic  renal cell cancer. New seizure. Abnormal head CT. Known intracranial metastatic disease.  EXAM: MRI HEAD WITHOUT AND WITH CONTRAST  TECHNIQUE: Multiplanar, multiecho pulse sequences of the brain and surrounding structures were obtained according to standard protocol without and with intravenous contrast  CONTRAST:  20mL MULTIHANCE GADOBENATE DIMEGLUMINE 529 MG/ML IV SOLN  COMPARISON:  Head CT same day. MRI 03/30/2013.  FINDINGS: Since the study of 1 month ago, the dominant changes that of slight enlargement and hemorrhagic transformation of the left posterior frontal metastasis. Previously, this measured 16 mm in diameter and today it measures 20 mm in diameter and shows internal blood products. Two nearby a metastatic deposits just posterior to the largest deposit are not significantly changed, measuring 16 x 8 mm in diameter and 5 mm in diameter. Regional vasogenic edema has increased slightly. Mass effect is minimal with no more than 1 mm of left-to-right shift.  In the right cerebral hemisphere, there is a 4 mm metastasis in the parietal occipital region with surrounding vasogenic edema. The lesion measures no larger, but the edema is slightly more extensive. In the right temporoparietal junction, there is a 1 mm lesion which is unchanged.  With respect to the cerebellum, a cluster of metastases at the inferior cerebellum on the left are unchanged. The dominant lesion measures 8 x 12 mm. Edema is similar.  Ventricular size is unchanged. No extra-axial fluid collection. No new ischemic infarction. Old right basal ganglia/radiating white matter tract infarction. No pituitary mass or inflammatory sinus disease. No skull or skullbase lesion.  IMPRESSION: Since last month, there has been slight enlargement of the dominant lesion in the left posterior frontal region which is increased in size from 16 mm to 20 mm and now shows some internal blood products. Slight increase in regional vasogenic edema. Other intracranial  metastatic lesions as enumerated above are stable in size. There is slight increase in edema surrounding the small right parietal occipital metastasis.   Electronically Signed   By: Paulina Fusi M.D.   On: 05/02/2013 11:59   US Biopsy  04/05/2013   *RADIOLOGY REPORT*  Clinical history:67 year old with a right renal mass and evidence for metastatic disease.  Tissue diagnosis is needed.  PROCEDURE(S): ULTRASOUND GUIDED BIOPSY OF RIGHT RENAL MASS  Physician: Rachelle Hora. Henn, MD  Medications:Versed 2 mg, Fentanyl 50 mcg. A radiology nurse monitored the patient for moderate sedation.  Moderate sedation time:15 minutes  Fluoroscopy time: None  Procedure:The procedure was explained to the patient.  The risks and benefits of the procedure were  discussed and the patient's questions were addressed.  Informed consent was obtained from the patient.  The right kidney was evaluated with ultrasound.  The patient was placed prone.  The right flank was prepped and draped in a sterile fashion.  The skin was anesthetized with lidocaine. 17 gauge guide needle was directed into the renal mass with ultrasound guidance.  Two core biopsies obtained with an 18 gauge core device.  Samples placed in formalin.  Gelfoam was inserted through the guide needle for hemostasis.  Findings:Large right renal mass.  Needle position confirmed within the mass.  Complications: None  Impression:Ultrasound guided core biopsies of the right renal mass.   Original Report Authenticated By: Richarda Overlie, M.D.    Microbiology: No results found for this or any previous visit (from the past 240 hour(s)).   Labs: Basic Metabolic Panel:  Recent Labs Lab 05/02/13 0148 05/02/13 0635 05/03/13 0508  NA 140 136 136  K 4.2 4.0 4.0  CL 106 103 101  CO2  --  25 26  GLUCOSE 369* 247* 342*  BUN 26* 22 19  CREATININE 0.90 0.66 0.66  CALCIUM  --  8.9 9.1  MG  --  1.9  --    Liver Function Tests:  Recent Labs Lab 05/02/13 0635  AST 7  ALT 13  ALKPHOS 81   BILITOT 0.2*  PROT 5.7*  ALBUMIN 2.7*   No results found for this basename: LIPASE, AMYLASE,  in the last 168 hours No results found for this basename: AMMONIA,  in the last 168 hours CBC:  Recent Labs Lab 05/02/13 0148 05/02/13 0635 05/03/13 0508  WBC  --  5.6 7.9  NEUTROABS  --  4.7  --   HGB 9.9* 9.3* 9.8*  HCT 29.0* 27.9* 28.8*  MCV  --  85.6 84.7  PLT  --  133* 162   Cardiac Enzymes: No results found for this basename: CKTOTAL, CKMB, CKMBINDEX, TROPONINI,  in the last 168 hours BNP: BNP (last 3 results) No results found for this basename: PROBNP,  in the last 8760 hours CBG:  Recent Labs Lab 05/02/13 0800 05/02/13 1210 05/02/13 1638 05/02/13 2139 05/03/13 0750  GLUCAP 334* 299* 331* 328* 279*       Signed:  Brier Firebaugh  [facility] 05/03/2013, 11:39 AM

## 2013-05-04 ENCOUNTER — Ambulatory Visit: Payer: Medicare Other

## 2013-05-04 ENCOUNTER — Other Ambulatory Visit: Payer: Medicare Other | Admitting: Lab

## 2013-05-10 ENCOUNTER — Encounter: Payer: Self-pay | Admitting: Radiation Oncology

## 2013-05-13 ENCOUNTER — Emergency Department (HOSPITAL_COMMUNITY)
Admission: EM | Admit: 2013-05-13 | Discharge: 2013-05-13 | Disposition: A | Discharge status: Home / Self Care | Payer: Medicare Other | Attending: Emergency Medicine | Admitting: Emergency Medicine

## 2013-05-13 ENCOUNTER — Encounter (HOSPITAL_COMMUNITY): Payer: Self-pay | Admitting: Emergency Medicine

## 2013-05-13 DIAGNOSIS — Z7982 Long term (current) use of aspirin: Secondary | ICD-10-CM | POA: Insufficient documentation

## 2013-05-13 DIAGNOSIS — F29 Unspecified psychosis not due to a substance or known physiological condition: Secondary | ICD-10-CM | POA: Insufficient documentation

## 2013-05-13 DIAGNOSIS — Z79899 Other long term (current) drug therapy: Secondary | ICD-10-CM | POA: Insufficient documentation

## 2013-05-13 DIAGNOSIS — F05 Delirium due to known physiological condition: Secondary | ICD-10-CM

## 2013-05-13 DIAGNOSIS — IMO0002 Reserved for concepts with insufficient information to code with codable children: Secondary | ICD-10-CM | POA: Insufficient documentation

## 2013-05-13 DIAGNOSIS — E119 Type 2 diabetes mellitus without complications: Secondary | ICD-10-CM | POA: Insufficient documentation

## 2013-05-13 DIAGNOSIS — Z85841 Personal history of malignant neoplasm of brain: Secondary | ICD-10-CM | POA: Insufficient documentation

## 2013-05-13 DIAGNOSIS — Z794 Long term (current) use of insulin: Secondary | ICD-10-CM | POA: Insufficient documentation

## 2013-05-13 DIAGNOSIS — Z87891 Personal history of nicotine dependence: Secondary | ICD-10-CM | POA: Insufficient documentation

## 2013-05-13 DIAGNOSIS — Z85118 Personal history of other malignant neoplasm of bronchus and lung: Secondary | ICD-10-CM | POA: Insufficient documentation

## 2013-05-13 DIAGNOSIS — I2510 Atherosclerotic heart disease of native coronary artery without angina pectoris: Secondary | ICD-10-CM | POA: Insufficient documentation

## 2013-05-13 DIAGNOSIS — Z85528 Personal history of other malignant neoplasm of kidney: Secondary | ICD-10-CM | POA: Insufficient documentation

## 2013-05-13 DIAGNOSIS — Z923 Personal history of irradiation: Secondary | ICD-10-CM | POA: Insufficient documentation

## 2013-05-13 DIAGNOSIS — E785 Hyperlipidemia, unspecified: Secondary | ICD-10-CM | POA: Insufficient documentation

## 2013-05-13 MED ORDER — DEXAMETHASONE 4 MG PO TABS: 4.0000 mg | ORAL_TABLET | Freq: Three times a day (TID) | ORAL | Status: DC

## 2013-05-13 MED ORDER — LEVETIRACETAM 250 MG PO TABS
250.0000 mg | ORAL_TABLET | Freq: Once | ORAL | Status: AC
  Administered 2013-05-13: 250 mg via ORAL
  Filled 2013-05-13: qty 1

## 2013-05-13 MED ORDER — LEVETIRACETAM 1000 MG PO TABS: 1000.0000 mg | ORAL_TABLET | Freq: Two times a day (BID) | ORAL | Status: DC

## 2013-05-13 MED ORDER — DEXAMETHASONE 4 MG PO TABS
4.0000 mg | ORAL_TABLET | Freq: Once | ORAL | Status: AC
  Administered 2013-05-13: 4 mg via ORAL
  Filled 2013-05-13: qty 1

## 2013-05-13 NOTE — Discharge Instructions (Signed)
 Go back to taking decadron  3 times daily.  Increase your Keppra  to 1000 mg twice daily.  Follow up with Dr. Freddie next week.

## 2013-05-13 NOTE — ED Provider Notes (Signed)
CSN: [id number]     Arrival date & time 05/13/13  1200 History   First MD Initiated Contact with Patient 05/13/13 1222     Chief Complaint  Patient presents with  . Altered Mental Status - possible seizure    (Consider location/radiation/quality/duration/timing/severity/associated sxs/prior Treatment) HPI Comments: Pt with h/o metastatic kidney cancer to lung adn brain, had first seizures in August, had recurrence 2 weeks ago, seen in the ED and briefly admitted on 9/29.  CT and MRI showed largest lesion had grown despite radiation treatments. Pt's difficulty with word finding is similar to prior episodes of post ictal phase per spouse.  Pt denies HA, trauma, oral trauma, incontinence, focal weakness or numbness.  He was increased to 750 mg keppra and increased back up to TID decadron after discharge.  He is supposed to follow up with Dr. Cyndie Chime on 10/20 with plans to begin a new treatment, orally for cancer.    Patient is a 67 y.o. male presenting with altered mental status. The history is provided by the patient, the spouse and medical records.  Altered Mental Status Presenting symptoms: confusion and disorientation   Presenting symptoms: no behavior changes, no combativeness and no unresponsiveness   Severity:  Moderate Most recent episode:  Today Episode history:  Single Duration:  2 hours Timing:  Constant Progression:  Partially resolved Chronicity:  Recurrent Associated symptoms: no abdominal pain, no fever, no headaches, no nausea, no rash and no vomiting     Past Medical History  Diagnosis Date  . Coronary artery disease   . Diabetes mellitus without complication   . Hyperlipidemia   . Cancer     kidney, mets to brain and lung  . 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 Hx of radiation therapy 04/07/13-04/20/13    whole brain/r side renal tumor    Past Surgical History  Procedure Laterality Date  .  Cardiac catheterization  03/03    2 stents  . Biopsy of kidney Right 04/05/2013  . Knee surgery Left    Family History  Problem Relation Age of Onset  . Skin cancer Father   . Thyroid cancer Brother   . Kidney cancer Maternal Uncle    History  Substance Use Topics  . Smoking status: Former Smoker -- 0.50 packs/day    Types: Cigarettes    Quit date: 03/28/2013  . Smokeless tobacco: Not on file  . Alcohol Use: No    Review of Systems  Constitutional: Negative for fever, chills and appetite change.  Respiratory: Negative for cough and shortness of breath.   Gastrointestinal: Negative for nausea, vomiting, abdominal pain and diarrhea.  Musculoskeletal: Negative for back pain, neck pain and neck stiffness.  Skin: Negative for rash.  Neurological: Negative for headaches.  Psychiatric/Behavioral: Positive for confusion.  All other systems reviewed and are negative.    Allergies  Lipitor and Metformin and related  Home Medications   Current Outpatient Rx  Name  Route  Sig  Dispense  Refill  . aspirin 325 MG EC tablet   Oral   Take 1 tablet (325 mg total) by mouth daily.         . fish oil-omega-3 fatty acids 1000 MG capsule   Oral   Take 1 g by mouth daily.         . fluconazole (DIFLUCAN) 100 MG tablet   Oral   Take 100 mg by mouth daily as needed (for thrush).          Marland Kitchen  insulin aspart (NOVOLOG) 100 UNIT/ML injection      Before each meal 3 times a day, 140-199 - 2 units, 200-250 - 4 units, 251-299 - 6 units,  300-349 - 8 units,  350 or above 10 units. Insulin PEN if approved, provide syringes and needles if needed.   1 vial   12   . insulin glargine (LANTUS) 100 UNIT/ML injection   Subcutaneous   Inject 60 Units into the skin at bedtime.         . pantoprazole (PROTONIX) 40 MG tablet   Oral   Take 1 tablet (40 mg total) by mouth daily at 12 noon.   30 tablet   0   . psyllium (REGULOID) 0.52 G capsule   Oral   Take 0.52 g by mouth daily.          . simvastatin (ZOCOR) 80 MG tablet   Oral   Take 80 mg by mouth at bedtime.         Marland Kitchen dexamethasone (DECADRON) 4 MG tablet   Oral   Take 1 tablet (4 mg total) by mouth 3 (three) times daily.   90 tablet   0   . levETIRAcetam (KEPPRA) 1000 MG tablet   Oral   Take 1 tablet (1,000 mg total) by mouth 2 (two) times daily.   60 tablet   0    BP 126/45  Pulse 56  Temp(Src) 98.2 F (36.8 C) (Oral)  Resp 20  SpO2 97% Physical Exam  Nursing note and vitals reviewed. Constitutional: He is oriented to person, place, and time. He appears well-developed and well-nourished. No distress.  HENT:  Head: Normocephalic and atraumatic.  Eyes: Conjunctivae and EOM are normal.  Neck: Normal range of motion. Neck supple.  Cardiovascular: Normal rate, regular rhythm and intact distal pulses.   No murmur heard. Pulmonary/Chest: Effort normal and breath sounds normal. No respiratory distress. He has no wheezes.  Abdominal: Soft. There is no tenderness. There is no rebound and no guarding.  Musculoskeletal: He exhibits no edema.  Neurological: He is alert and oriented to person, place, and time. He exhibits normal muscle tone. Coordination normal.  Skin: Skin is warm. No rash noted. He is not diaphoretic. No pallor.  Psychiatric: He has a normal mood and affect.    ED Course  EKG (if new onset seizures)  Date/Time: 05/13/2013 1:07 PM Performed by: Lear Ng. Authorized by: Hurman Horn    (including critical care time) Labs Review Labs Reviewed - No data to display Imaging Review No results found.  EKG Interpretation   None      ra sat is 96% and I interpret to be adequate   1:05 PM Discussed with Dr. Cyril Mourning who saw pt in consultation on 9/29 and is familiar with him.  He agrees since pt is rapidly improving, likely pt is post ictal an dno CT is needed.  He recommends going up to 1000 mg BID of Keppra, and to go back to TID decadron.    I will observe pt in the ED  a couple of more hours to ensure he is not in status, but he seems to be near or at baseline mentally.  Will give decadron now and 250 of keppra to make 1000 mg total for AM dose.     2:23 PM Pt has continued to improve, feels well.  Plan discussed with pt and family and they are in agreement.  Wil d/c home with new medication instructions.  MDM   1. Post-ictal confusion      Pt's symptoms are identical to presentation after seizure on 9/29 after reading Dr. Sharman Cheek notes.  I suspect pt is post ictal.  He has no HA now, non focal exam.  3 days ago, he was titrated down to 2 tablets of decadron from 3.  He has been compliant with keppra.  No other unusual symptoms.      Gavin Pound. Andriel Omalley, MD 05/13/13 1511

## 2013-05-13 NOTE — ED Notes (Signed)
Per pt's wife, states she called pt this morning, he answered phone, seemed very confused. Pt's wife asked if he was okay and she came home- found pt sitting in chair. No injuries noted. Wife believes pt had seizure this morning, states he is normally confused after.  Hx of seizures, cancer w/ mets to brain and lung.  Pt is a+o4x at present

## 2013-05-16 ENCOUNTER — Telehealth: Payer: Self-pay | Admitting: *Deleted

## 2013-05-16 NOTE — Telephone Encounter (Signed)
Received message earlier from pt's wife asking since pt was seen in ED last Friday "does he need to be seen any earlier with Dr. Cyndie Chime"; ED instructed them to call for appt. Notified pt's wife that per Dr. Cyndie Chime; pt does not need to be seen earlier than 05/23/13.  Pt's wife verbalized understanding.

## 2013-05-18 ENCOUNTER — Encounter: Payer: Self-pay | Admitting: Cardiology

## 2013-05-18 ENCOUNTER — Encounter: Payer: Self-pay | Admitting: *Deleted

## 2013-05-19 ENCOUNTER — Ambulatory Visit
Admission: RE | Admit: 2013-05-19 | Discharge: 2013-05-19 | Disposition: A | Discharge status: Home / Self Care | Payer: Medicare Other | Source: Ambulatory Visit | Attending: Radiation Oncology | Admitting: Radiation Oncology

## 2013-05-19 ENCOUNTER — Other Ambulatory Visit: Payer: Self-pay | Admitting: Radiation Therapy

## 2013-05-19 ENCOUNTER — Encounter: Payer: Self-pay | Admitting: Radiation Oncology

## 2013-05-19 VITALS — BP 136/58 | HR 68 | Temp 97.8°F | Resp 20 | Wt 211.7 lb

## 2013-05-19 DIAGNOSIS — C7931 Secondary malignant neoplasm of brain: Secondary | ICD-10-CM

## 2013-05-19 HISTORY — DX: Personal history of irradiation: Z92.3

## 2013-05-19 MED ORDER — NYSTATIN 100000 UNIT/ML MT SUSP: 500000.0000 [IU] | Freq: Four times a day (QID) | OROMUCOSAL | Status: DC

## 2013-05-19 MED ORDER — PANTOPRAZOLE SODIUM 40 MG PO TBEC: 40.0000 mg | DELAYED_RELEASE_TABLET | Freq: Every day | ORAL | Status: DC

## 2013-05-19 NOTE — Progress Notes (Signed)
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  The patient indicates that he is experiencing some fatigue. He also has had a couple of episodes of seizures and was evaluated in the emergency room with one brief hospital stay with the first episode. The patient currently therefore is on Decadron 3 times a day and his seizure medication dose was increased. The current dose has been in place since last Friday when he was seen for his second seizure in the emergency room.                              ALLERGIES:  is allergic to lipitor and metformin and related.  Meds: Current Outpatient Prescriptions  Medication Sig Dispense Refill  . aspirin 325 MG EC tablet Take 1 tablet (325 mg total) by mouth daily.      Marland Kitchen dexamethasone (DECADRON) 4 MG tablet Take 1 tablet (4 mg total) by mouth 3 (three) times daily.  90 tablet  0  . fish oil-omega-3 fatty acids 1000 MG capsule Take 1 g by mouth daily.      . insulin glargine (LANTUS) 100 UNIT/ML injection Inject 60 Units into the skin at bedtime.      . levETIRAcetam (KEPPRA) 1000 MG tablet Take 1 tablet (1,000 mg total) by mouth 2 (two) times daily.  60 tablet  0  . psyllium (REGULOID) 0.52 G capsule Take 0.52 g by mouth daily.      . simvastatin (ZOCOR) 80 MG tablet Take 80 mg by mouth at bedtime.      . fluconazole (DIFLUCAN) 100 MG tablet Take 100 mg by mouth daily as needed (for thrush).       . insulin aspart (NOVOLOG) 100 UNIT/ML injection Before each meal 3 times a day, 140-199 - 2 units, 200-250 - 4 units, 251-299 - 6 units,  300-349 - 8 units,  350 or above 10 units. Insulin PEN if approved, provide syringes  and needles if needed.  1 vial  12  . pantoprazole (PROTONIX) 40 MG tablet Take 1 tablet (40 mg total) by mouth daily at 12 noon.  30 tablet  0   No current facility-administered medications for this encounter.    Physical Findings: The patient is in no acute distress. Patient is alert and oriented.  weight is 211 lb 11.2 oz (96.026 kg). His oral temperature is 97.8 F (36.6 C). His blood pressure is 136/58 and his pulse is 68. His respiration is 20 and oxygen saturation is 100%. .   A small degree of oral thrush is present on exam. The patient's scan shows some radiation change/dryness.  Lab Findings: Lab Results  Component Value Date   WBC 7.9 05/03/2013   HGB 9.8* 05/03/2013   HCT 28.8* 05/03/2013   MCV 84.7 05/03/2013   PLT 162 05/03/2013     Radiographic Findings: Ct Head Wo Contrast  05/02/2013   CLINICAL DATA:  Seizure. Metastatic renal cancer.  EXAM: CT HEAD WITHOUT CONTRAST  TECHNIQUE: Contiguous axial images were obtained from the base of the skull through the vertex without intravenous contrast.  COMPARISON:  03/30/2013 MRI.  FINDINGS: Hyperdense lesion noted within the left frontal lobe measuring 19 mm, likely hemorrhagic metastasis. The density is increased since prior study likely reflecting interval hemorrhage within this metastasis. Surrounding extensive edema, stable. No midline shift. No hydrocephalus. The other smaller enhancing lesions by MRI are not as well visualized by CT.  Old lacunar infarct in the right basal ganglia. Chronic microvascular changes in the deep white matter. No acute calvarial abnormality.  IMPRESSION: Hyperdense 19 mm lesion in the left frontal lobe compatible with hemorrhagic metastasis. Stable surrounding extensive vasogenic edema. No mass effect or midline shift.   Electronically Signed   By: Charlett Nose M.D.   On: 05/02/2013 02:22   Mr Laqueta Jean ZO Contrast  05/02/2013   CLINICAL DATA:  Metastatic renal cell cancer. New seizure. Abnormal head CT.  Known intracranial metastatic disease.  EXAM: MRI HEAD WITHOUT AND WITH CONTRAST  TECHNIQUE: Multiplanar, multiecho pulse sequences of the brain and surrounding structures were obtained according to standard protocol without and with intravenous contrast  CONTRAST:  20mL MULTIHANCE GADOBENATE DIMEGLUMINE 529 MG/ML IV SOLN  COMPARISON:  Head CT same day. MRI 03/30/2013.  FINDINGS: Since the study of 1 month ago, the dominant changes that of slight enlargement and hemorrhagic transformation of the left posterior frontal metastasis. Previously, this measured 16 mm in diameter and today it measures 20 mm in diameter and shows internal blood products. Two nearby a metastatic deposits just posterior to the largest deposit are not significantly changed, measuring 16 x 8 mm in diameter and 5 mm in diameter. Regional vasogenic edema has increased slightly. Mass effect is minimal with no more than 1 mm of left-to-right shift.  In the right cerebral hemisphere, there is a 4 mm metastasis in the parietal occipital region with surrounding vasogenic edema. The lesion measures no larger, but the edema is slightly more extensive. In the right temporoparietal junction, there is a 1 mm lesion which is unchanged.  With respect to the cerebellum, a cluster of metastases at the inferior cerebellum on the left are unchanged. The dominant lesion measures 8 x 12 mm. Edema is similar.  Ventricular size is unchanged. No extra-axial fluid collection. No new ischemic infarction. Old right basal ganglia/radiating white matter tract infarction. No pituitary mass or inflammatory sinus disease. No skull or skullbase lesion.  IMPRESSION: Since last month, there has been slight enlargement of the dominant lesion in the left posterior frontal region which is increased in size from 16 mm to 20 mm and now shows some internal blood products. Slight increase in regional vasogenic edema. Other intracranial metastatic lesions as enumerated above are stable  in size. There is slight increase in edema surrounding the small right parietal occipital metastasis.   Electronically Signed   By: Paulina Fusi M.D.   On: 05/02/2013 11:59    Impression:    The patient is doing satisfactorily at this time although he has not been able to taper his steroids. He has had a couple of seizures and his dose of Keppra has been increased. Hopefully he will be able to begin tapering his dose although I believe it is too early to do this today. He is seeing Dr. Cyndie Chime next Monday. I've given him a refill on Prilosec and also given him a prescription for nystatin for some residual thrush.  Plan:  Followup MRI scan of the brain in 2 months.   Radene Gunning, M.D., Ph.D.

## 2013-05-19 NOTE — Progress Notes (Signed)
Follow uprad tx  brain tx 04/07/13-04/20/13, head dry,peeling, no c/o blurry vision, no nausea, no pain, does have thrush stil sides of mouth, tongue looks good, energy level is low, slow stady gait, 100% room air, sometimes difficulty swallowing water stated, takes naps in morning and afternoons, needs refills on protonix and diflucan, taking decadron 4mg  tid, and now keppra increased to 1000mg  bid MRI results 05/02/13

## 2013-05-20 ENCOUNTER — Telehealth: Payer: Self-pay | Admitting: *Deleted

## 2013-05-20 ENCOUNTER — Other Ambulatory Visit (HOSPITAL_BASED_OUTPATIENT_CLINIC_OR_DEPARTMENT_OTHER): Payer: Medicare Other | Admitting: Lab

## 2013-05-20 DIAGNOSIS — C649 Malignant neoplasm of unspecified kidney, except renal pelvis: Secondary | ICD-10-CM

## 2013-05-20 DIAGNOSIS — C7931 Secondary malignant neoplasm of brain: Secondary | ICD-10-CM

## 2013-05-20 DIAGNOSIS — N2889 Other specified disorders of kidney and ureter: Secondary | ICD-10-CM

## 2013-05-20 DIAGNOSIS — R569 Unspecified convulsions: Secondary | ICD-10-CM

## 2013-05-20 DIAGNOSIS — C801 Malignant (primary) neoplasm, unspecified: Secondary | ICD-10-CM

## 2013-05-20 DIAGNOSIS — C641 Malignant neoplasm of right kidney, except renal pelvis: Secondary | ICD-10-CM

## 2013-05-20 DIAGNOSIS — C7800 Secondary malignant neoplasm of unspecified lung: Secondary | ICD-10-CM

## 2013-05-20 LAB — CBC WITH DIFFERENTIAL/PLATELET
BASO%: 0.2 % (ref 0.0–2.0)
EOS%: 0.1 % (ref 0.0–7.0)
Eosinophils Absolute: 0 10*3/uL (ref 0.0–0.5)
LYMPH%: 5.1 % — ABNORMAL LOW (ref 14.0–49.0)
MCH: 29.5 pg (ref 27.2–33.4)
MCHC: 33.3 g/dL (ref 32.0–36.0)
MCV: 88.4 fL (ref 79.3–98.0)
MONO#: 0.6 10*3/uL (ref 0.1–0.9)
MONO%: 5.8 % (ref 0.0–14.0)
NEUT#: 9.7 10*3/uL — ABNORMAL HIGH (ref 1.5–6.5)
NEUT%: 88.8 % — ABNORMAL HIGH (ref 39.0–75.0)
Platelets: 152 10*3/uL (ref 140–400)
RBC: 2.76 10*6/uL — ABNORMAL LOW (ref 4.20–5.82)
WBC: 11 10*3/uL — ABNORMAL HIGH (ref 4.0–10.3)

## 2013-05-20 LAB — COMPREHENSIVE METABOLIC PANEL (CC13)
ALT: 15 U/L (ref 0–55)
AST: 8 U/L (ref 5–34)
Albumin: 2.6 g/dL — ABNORMAL LOW (ref 3.5–5.0)
Alkaline Phosphatase: 61 U/L (ref 40–150)
CO2: 24 mEq/L (ref 22–29)
Creatinine: 0.8 mg/dL (ref 0.7–1.3)
Sodium: 135 mEq/L — ABNORMAL LOW (ref 136–145)
Total Bilirubin: 0.34 mg/dL (ref 0.20–1.20)
Total Protein: 5.3 g/dL — ABNORMAL LOW (ref 6.4–8.3)

## 2013-05-20 NOTE — Telephone Encounter (Signed)
Called patient to inform of test, spoke with patient and he is aware of this test. 

## 2013-05-23 ENCOUNTER — Other Ambulatory Visit: Payer: Self-pay

## 2013-05-23 ENCOUNTER — Telehealth: Payer: Self-pay | Admitting: *Deleted

## 2013-05-23 ENCOUNTER — Ambulatory Visit: Payer: Medicare Other | Admitting: Cardiology

## 2013-05-23 ENCOUNTER — Ambulatory Visit (HOSPITAL_BASED_OUTPATIENT_CLINIC_OR_DEPARTMENT_OTHER): Payer: Medicare Other | Admitting: Oncology

## 2013-05-23 ENCOUNTER — Ambulatory Visit: Payer: Medicare Other

## 2013-05-23 DIAGNOSIS — C649 Malignant neoplasm of unspecified kidney, except renal pelvis: Secondary | ICD-10-CM

## 2013-05-23 DIAGNOSIS — C7931 Secondary malignant neoplasm of brain: Secondary | ICD-10-CM

## 2013-05-23 DIAGNOSIS — C7800 Secondary malignant neoplasm of unspecified lung: Secondary | ICD-10-CM

## 2013-05-23 DIAGNOSIS — G40802 Other epilepsy, not intractable, without status epilepticus: Secondary | ICD-10-CM

## 2013-05-23 NOTE — Telephone Encounter (Signed)
Message copied by Orbie Hurst on Mon May 23, 2013  1:48 PM ------      Message from: Levert Feinstein      Created: Fri May 20, 2013  5:42 PM       Call pt  Sugars still running high - he needs to chek with his primary care re advice on meds ------

## 2013-05-23 NOTE — Telephone Encounter (Signed)
Spoke with patient.  Let him know that sugars are still running high and Dr.Granfortuna would like him to check with his PCP for advice.  Patient states he has already done this and PCP has changed his medication.

## 2013-05-24 ENCOUNTER — Telehealth: Payer: Self-pay | Admitting: Oncology

## 2013-05-24 NOTE — Telephone Encounter (Signed)
pt did not want to take appt over the phone due to it being to early.  Mailed pt avs and letter

## 2013-05-26 ENCOUNTER — Encounter: Payer: Self-pay | Admitting: Oncology

## 2013-05-26 ENCOUNTER — Other Ambulatory Visit: Payer: Self-pay | Admitting: *Deleted

## 2013-05-26 MED ORDER — SUNITINIB MALATE 50 MG PO CAPS: 50.0000 mg | ORAL_CAPSULE | Freq: Every day | ORAL | Status: DC

## 2013-05-26 NOTE — Telephone Encounter (Signed)
Sutent script given to [facility] 05/26/13.

## 2013-05-26 NOTE — Progress Notes (Signed)
Faxed sutent prescription to [facility].

## 2013-05-26 NOTE — Progress Notes (Signed)
Hematology and Oncology Follow Up Visit  George [name] [id number] 02-16-1946 67 y.o. 05/26/2013 9:00 AM   Principle Diagnosis: Encounter Diagnoses  Name Primary?  . Metastatic renal cell carcinoma to brain Yes  . 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 He has a huge the primary originating from his right kidney. He has bilateral pulmonary lesions. He is remarkably asymptomatic. He never had any hematuria.  He was seen in initial medical oncology consultation by one of our temporary physicians and requested transfer to a full-time physician. He had a very thorough evaluation by Dr.Chism. Please refer to his excellent and comprehensive note of September 10.  He was started on antiseizure medication. He was evaluated by radiation oncology and has just completed external beam radiation to the whole brain. In addition, he believes he received radiation to the primary in the right kidney but I do not find any documentation of this in the radiation oncology notes.Marland Kitchen He did have a urologic evaluation while he was hospitalized and was not felt to be a candidate for nephrectomy due to the advanced brain lesions. Just 5 days after I saw him for the first time on September 24, he was admitted to the hospital on September 29 with recurrent seizures. No gross changes on followup MRI of the brain. Seizure medication was increased. Decadron was increased back up to 4 mg 4 times daily. He was observed in the hospital for about 48 hours and then discharged. He had one additional minor neurologic change since that time not requiring hospitalization. At time of his visit with me on September 24,  I recommended initiation of  Sutent. There must have been a drop in protocol because the medication was never delivered to his home.  He is still having some problems with intermittent expressive aphasia. No gross seizures since increasing the Keppra.    Medications: reviewed  Allergies:  Allergies  Allergen Reactions  . Lipitor [Atorvastatin] Other (See Comments)    Made him feel weak  . Metformin And Related Diarrhea    Review of Systems: Hematology: negative for swollen glands,  ENT ROS: negative for - oral lesions or sore throat Breast ROS: Respiratory ROS: negative for - cough, pleuritic pain, shortness of breath or wheezing Cardiovascular ROS: negative for - chest pain, dyspnea on exertion, edema, irregular heartbeat, murmur, orthopnea, palpitations, paroxysmal nocturnal dyspnea or rapid heart rate in fact EKG today shows bradycardia Gastrointestinal ROS: negative for - abdominal pain, appetite loss, blood in stools, change in bowel habits, constipation, diarrhea, heartburn, hematemesis, melena, nausea/vomiting or swallowing difficulty/pain Genito-Urinary ROS: No, dysuria, hematuria, incontinence,, nocturia or urinary frequency/urgency Musculoskeletal ROS: negative for - joint pain, joint stiffness, joint swelling, muscle pain, muscular weakness  Neurological ROS: See above Dermatological ROS: negative for rash, ecchymosis Remaining ROS negative.  Physical Exam: There were no vitals taken for this visit. Wt Readings from Last 3 Encounters:  05/23/13 214 lb 9.6 oz (97.342 kg)  05/19/13 211 lb 11.2 oz (96.026 kg)  05/02/13 210 lb 12.2 oz (95.6 kg)     General appearance: Well-nourished Caucasian man. He is alert and oriented. HENNT: Pharynx no erythema, exudate, mass, or ulcer. No thyromegaly or thyroid nodules Lymph nodes: No  cervical, supraclavicular, or axillary lymphadenopathy Breasts:  Lungs: Clear to auscultation, resonant to percussion throughout Heart: Regular rhythm, no murmur, no gallop,  no rub, no click, no edema Abdomen: Soft, nontender, normal bowel sounds large mass palpable in the right flank anteriorly, no hepatosplenomegaly Extremities: No edema, no calf tenderness Musculoskeletal: no joint deformities GU:  Vascular: Carotid pulses 2+, no bruits, distal pulses: Dorsalis pedis 1+ symmetric Neurologic: Alert, oriented, PERRLA, optic discs sharp and vessels normal, no hemorrhage or exudate, cranial nerves grossly normal, motor strength 5 over 5, reflexes 1+ symmetric, upper body coordination normal, gait normal, he can do simple calculations. He has problems trying to spell words backwards. Skin: No rash or ecchymosis  Lab Results: CBC W/Diff    Component Value Date/Time   WBC 11.0* 05/20/2013 1601   WBC 7.9 05/03/2013 0508   RBC 2.76* 05/20/2013 1601   RBC 3.40* 05/03/2013 0508   HGB 8.1* 05/20/2013 1601   HGB 9.8* 05/03/2013 0508   HCT 24.4* 05/20/2013 1601   HCT 28.8* 05/03/2013 0508   PLT 152 05/20/2013 1601   PLT 162 05/03/2013 0508   MCV 88.4 05/20/2013 1601   MCV 84.7 05/03/2013 0508   MCH 29.5 05/20/2013 1601   MCH 28.8 05/03/2013 0508   MCHC 33.3 05/20/2013 1601   MCHC 34.0 05/03/2013 0508   RDW 18.9* 05/20/2013 1601   RDW 15.4 05/03/2013 0508   LYMPHSABS 0.6* 05/20/2013 1601   LYMPHSABS 0.6* 05/02/2013 0635   MONOABS 0.6 05/20/2013 1601   MONOABS 0.3 05/02/2013 0635   EOSABS 0.0 05/20/2013 1601   EOSABS 0.0 05/02/2013 0635   BASOSABS 0.0 05/20/2013 1601   BASOSABS 0.0 05/02/2013 0635     Chemistry      Component Value Date/Time   NA 135* 05/20/2013 1602   NA 136 05/03/2013 0508   K 4.3 05/20/2013 1602   K 4.0 05/03/2013 0508   CL 101 05/03/2013 0508   CO2 24 05/20/2013 1602   CO2 26 05/03/2013 0508   BUN 29.5* 05/20/2013 1602   BUN 19 05/03/2013 0508   CREATININE 0.8 05/20/2013 1602   CREATININE 0.66 05/03/2013 0508      Component Value Date/Time   CALCIUM 8.6 05/20/2013 1602   CALCIUM 9.1 05/03/2013 0508   ALKPHOS 61 05/20/2013 1602   ALKPHOS 81  05/02/2013 0635   AST 8 05/20/2013 1602   AST 7 05/02/2013 0635   ALT 15 05/20/2013 1602   ALT 13 05/02/2013 0635   BILITOT 0.34 05/20/2013 1602   BILITOT 0.2* 05/02/2013 0635       Radiological Studies: Ct Head Wo Contrast  05/02/2013   CLINICAL DATA:  Seizure. Metastatic renal cancer.  EXAM: CT HEAD WITHOUT CONTRAST  TECHNIQUE: Contiguous axial images were obtained from the base of the skull through the vertex without intravenous contrast.  COMPARISON:  03/30/2013 MRI.  FINDINGS: Hyperdense lesion noted within the left frontal lobe measuring 19 mm, likely hemorrhagic metastasis. The density is increased since prior study likely reflecting interval hemorrhage within this metastasis. Surrounding extensive edema, stable. No midline shift. No hydrocephalus. The other smaller enhancing lesions by MRI are not as well visualized by CT.  Old lacunar infarct in the right basal ganglia. Chronic microvascular changes in the deep white matter. No acute calvarial abnormality.  IMPRESSION: Hyperdense 19 mm lesion in the left frontal lobe compatible with hemorrhagic metastasis. Stable surrounding extensive vasogenic edema. No mass effect or midline shift.   Electronically Signed   By: Charlett Nose M.D.  On: 05/02/2013 02:22   Mr Laqueta Jean VH Contrast  05/02/2013   CLINICAL DATA:  Metastatic renal cell cancer. New seizure. Abnormal head CT. Known intracranial metastatic disease.  EXAM: MRI HEAD WITHOUT AND WITH CONTRAST  TECHNIQUE: Multiplanar, multiecho pulse sequences of the brain and surrounding structures were obtained according to standard protocol without and with intravenous contrast  CONTRAST:  20mL MULTIHANCE GADOBENATE DIMEGLUMINE 529 MG/ML IV SOLN  COMPARISON:  Head CT same day. MRI 03/30/2013.  FINDINGS: Since the study of 1 month ago, the dominant changes that of slight enlargement and hemorrhagic transformation of the left posterior frontal metastasis. Previously, this measured 16 mm in diameter and today  it measures 20 mm in diameter and shows internal blood products. Two nearby a metastatic deposits just posterior to the largest deposit are not significantly changed, measuring 16 x 8 mm in diameter and 5 mm in diameter. Regional vasogenic edema has increased slightly. Mass effect is minimal with no more than 1 mm of left-to-right shift.  In the right cerebral hemisphere, there is a 4 mm metastasis in the parietal occipital region with surrounding vasogenic edema. The lesion measures no larger, but the edema is slightly more extensive. In the right temporoparietal junction, there is a 1 mm lesion which is unchanged.  With respect to the cerebellum, a cluster of metastases at the inferior cerebellum on the left are unchanged. The dominant lesion measures 8 x 12 mm. Edema is similar.  Ventricular size is unchanged. No extra-axial fluid collection. No new ischemic infarction. Old right basal ganglia/radiating white matter tract infarction. No pituitary mass or inflammatory sinus disease. No skull or skullbase lesion.  IMPRESSION: Since last month, there has been slight enlargement of the dominant lesion in the left posterior frontal region which is increased in size from 16 mm to 20 mm and now shows some internal blood products. Slight increase in regional vasogenic edema. Other intracranial metastatic lesions as enumerated above are stable in size. There is slight increase in edema surrounding the small right parietal occipital metastasis.   Electronically Signed   By: Paulina Fusi M.D.   On: 05/02/2013 11:59    Impression:  #1. 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  I'm going to start him on Sutent 50 mg daily 4 weeks on 2 weeks off. We'll need to see why initial prescription did not get processed.  #2. 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  Continue Keppra at 750 mg twice daily. If any further seizure activity consider adding another anticonvulsant such as Dilantin.  #3 Coronary artery disease status post coronary stent times 10/03/2001   EKG today with sinus bradycardia rate 55 QT interval 438 left ventricular hypertrophy with strain pattern We'll need to monitor closely on Sutent which can cause prolongation of the QT interval leading to serious arrhythmias.  #4. Insulin-dependent diabetes.   #5. History of carcinoma in situ of the penis.  Wife present. Management plan discussed.     CC: Patient Care Team: Duane Lope, MD as PCP - General (Family Medicine) Levert Feinstein, MD as Consulting Physician (Oncology) Quintella Reichert, MD as Consulting Physician (Cardiology) Jonna Coup, MD as Consulting Physician (Radiation Oncology)   Levert Feinstein, MD 10/23/20149:00 AM

## 2013-05-27 ENCOUNTER — Telehealth: Payer: Self-pay | Admitting: *Deleted

## 2013-05-27 NOTE — Telephone Encounter (Signed)
Received a call from wife, Kathie Rhodes wondering about the status of the drug, sutent.   Called [facility] and spoke with [facility].  They have insurance approval for the drug, however there is a very high copay and they are working on getting the patient some financial help with this.  They will be calling the patient to get some financial information in order to get assistance.  Called patient and let him know where we are and why there is a delay.  Let him know that [facility] would be calling them to get some information to help with getting financial assistance and then the [facility] will call them when drug is available.  Patient appreciated the phone call and the information.

## 2013-06-01 ENCOUNTER — Telehealth: Payer: Self-pay | Admitting: *Deleted

## 2013-06-01 NOTE — Telephone Encounter (Addendum)
Received vm call from pt's wife asking if it is OK for pt to get flu shot.  Note to Dr Cyndie Chime. 06/02/13- Called pt's wife back & informed OK for flu shot per Dr. Cyndie Chime.

## 2013-06-06 ENCOUNTER — Encounter: Payer: Self-pay | Admitting: Oncology

## 2013-06-06 NOTE — Progress Notes (Signed)
Patient is approved through [employer], [id number], for free sutent until 08/03/13.

## 2013-06-09 ENCOUNTER — Other Ambulatory Visit: Payer: Self-pay

## 2013-06-13 ENCOUNTER — Other Ambulatory Visit: Payer: Self-pay | Admitting: Radiation Oncology

## 2013-06-14 ENCOUNTER — Other Ambulatory Visit: Payer: Self-pay | Admitting: Radiation Oncology

## 2013-06-21 ENCOUNTER — Ambulatory Visit (HOSPITAL_BASED_OUTPATIENT_CLINIC_OR_DEPARTMENT_OTHER): Payer: Medicare Other | Admitting: Oncology

## 2013-06-21 ENCOUNTER — Other Ambulatory Visit (HOSPITAL_BASED_OUTPATIENT_CLINIC_OR_DEPARTMENT_OTHER): Payer: Medicare Other | Admitting: Lab

## 2013-06-21 ENCOUNTER — Other Ambulatory Visit: Payer: Self-pay | Admitting: *Deleted

## 2013-06-21 VITALS — BP 173/84 | HR 86 | Temp 97.5°F | Resp 18 | Ht 74.0 in | Wt 225.4 lb

## 2013-06-21 DIAGNOSIS — R5381 Other malaise: Secondary | ICD-10-CM

## 2013-06-21 DIAGNOSIS — C649 Malignant neoplasm of unspecified kidney, except renal pelvis: Secondary | ICD-10-CM

## 2013-06-21 DIAGNOSIS — N2889 Other specified disorders of kidney and ureter: Secondary | ICD-10-CM

## 2013-06-21 DIAGNOSIS — C7931 Secondary malignant neoplasm of brain: Secondary | ICD-10-CM

## 2013-06-21 DIAGNOSIS — C7800 Secondary malignant neoplasm of unspecified lung: Secondary | ICD-10-CM

## 2013-06-21 DIAGNOSIS — E119 Type 2 diabetes mellitus without complications: Secondary | ICD-10-CM

## 2013-06-21 DIAGNOSIS — R569 Unspecified convulsions: Secondary | ICD-10-CM

## 2013-06-21 DIAGNOSIS — G40909 Epilepsy, unspecified, not intractable, without status epilepticus: Secondary | ICD-10-CM

## 2013-06-21 DIAGNOSIS — R109 Unspecified abdominal pain: Secondary | ICD-10-CM

## 2013-06-21 LAB — COMPREHENSIVE METABOLIC PANEL (CC13)
ALT: 16 U/L (ref 0–55)
AST: 8 U/L (ref 5–34)
Albumin: 2.7 g/dL — ABNORMAL LOW (ref 3.5–5.0)
Anion Gap: 11 mEq/L (ref 3–11)
BUN: 23.5 mg/dL (ref 7.0–26.0)
CO2: 23 mEq/L (ref 22–29)
Calcium: 8.6 mg/dL (ref 8.4–10.4)
Chloride: 105 mEq/L (ref 98–109)
Creatinine: 0.8 mg/dL (ref 0.7–1.3)
Glucose: 369 mg/dl — ABNORMAL HIGH (ref 70–140)
Potassium: 4.6 mEq/L (ref 3.5–5.1)

## 2013-06-21 LAB — CBC WITH DIFFERENTIAL/PLATELET
BASO%: 0.4 % (ref 0.0–2.0)
Basophils Absolute: 0 10*3/uL (ref 0.0–0.1)
HCT: 32.3 % — ABNORMAL LOW (ref 38.4–49.9)
HGB: 10.3 g/dL — ABNORMAL LOW (ref 13.0–17.1)
LYMPH%: 4.2 % — ABNORMAL LOW (ref 14.0–49.0)
MCH: 27.6 pg (ref 27.2–33.4)
MCHC: 31.9 g/dL — ABNORMAL LOW (ref 32.0–36.0)
MCV: 86.6 fL (ref 79.3–98.0)
MONO%: 2.7 % (ref 0.0–14.0)
Platelets: 260 10*3/uL (ref 140–400)
RBC: 3.73 10*6/uL — ABNORMAL LOW (ref 4.20–5.82)

## 2013-06-21 MED ORDER — LEVETIRACETAM 1000 MG PO TABS: 1000.0000 mg | ORAL_TABLET | Freq: Two times a day (BID) | ORAL | Status: AC

## 2013-06-21 MED ORDER — INSULIN ASPART 100 UNIT/ML ~~LOC~~ SOLN: SUBCUTANEOUS | Status: AC

## 2013-06-21 MED ORDER — DEXAMETHASONE 4 MG PO TABS: 4.0000 mg | ORAL_TABLET | Freq: Two times a day (BID) | ORAL | Status: AC

## 2013-06-22 ENCOUNTER — Telehealth: Payer: Self-pay | Admitting: Oncology

## 2013-06-22 NOTE — Telephone Encounter (Signed)
per 11/18 POF appts made and CAL mailed to pt shh

## 2013-06-24 ENCOUNTER — Emergency Department (HOSPITAL_COMMUNITY)
Admission: EM | Admit: 2013-06-24 | Discharge: 2013-06-24 | Disposition: A | Discharge status: Home / Self Care | Payer: Medicare Other | Attending: Emergency Medicine | Admitting: Emergency Medicine

## 2013-06-24 ENCOUNTER — Emergency Department (HOSPITAL_COMMUNITY): Payer: Medicare Other

## 2013-06-24 ENCOUNTER — Encounter (HOSPITAL_COMMUNITY): Payer: Self-pay | Admitting: Emergency Medicine

## 2013-06-24 ENCOUNTER — Telehealth: Payer: Self-pay | Admitting: *Deleted

## 2013-06-24 DIAGNOSIS — R1084 Generalized abdominal pain: Secondary | ICD-10-CM | POA: Insufficient documentation

## 2013-06-24 DIAGNOSIS — I10 Essential (primary) hypertension: Secondary | ICD-10-CM | POA: Insufficient documentation

## 2013-06-24 DIAGNOSIS — Z923 Personal history of irradiation: Secondary | ICD-10-CM | POA: Insufficient documentation

## 2013-06-24 DIAGNOSIS — M1990 Unspecified osteoarthritis, unspecified site: Secondary | ICD-10-CM | POA: Insufficient documentation

## 2013-06-24 DIAGNOSIS — Z79899 Other long term (current) drug therapy: Secondary | ICD-10-CM | POA: Insufficient documentation

## 2013-06-24 DIAGNOSIS — Z794 Long term (current) use of insulin: Secondary | ICD-10-CM | POA: Insufficient documentation

## 2013-06-24 DIAGNOSIS — C799 Secondary malignant neoplasm of unspecified site: Secondary | ICD-10-CM

## 2013-06-24 DIAGNOSIS — R188 Other ascites: Secondary | ICD-10-CM | POA: Insufficient documentation

## 2013-06-24 DIAGNOSIS — I2510 Atherosclerotic heart disease of native coronary artery without angina pectoris: Secondary | ICD-10-CM | POA: Insufficient documentation

## 2013-06-24 DIAGNOSIS — G40909 Epilepsy, unspecified, not intractable, without status epilepticus: Secondary | ICD-10-CM | POA: Insufficient documentation

## 2013-06-24 DIAGNOSIS — E785 Hyperlipidemia, unspecified: Secondary | ICD-10-CM | POA: Insufficient documentation

## 2013-06-24 DIAGNOSIS — C7800 Secondary malignant neoplasm of unspecified lung: Secondary | ICD-10-CM | POA: Insufficient documentation

## 2013-06-24 DIAGNOSIS — Z87891 Personal history of nicotine dependence: Secondary | ICD-10-CM | POA: Insufficient documentation

## 2013-06-24 DIAGNOSIS — IMO0002 Reserved for concepts with insufficient information to code with codable children: Secondary | ICD-10-CM | POA: Insufficient documentation

## 2013-06-24 DIAGNOSIS — Z9861 Coronary angioplasty status: Secondary | ICD-10-CM | POA: Insufficient documentation

## 2013-06-24 DIAGNOSIS — E119 Type 2 diabetes mellitus without complications: Secondary | ICD-10-CM | POA: Insufficient documentation

## 2013-06-24 DIAGNOSIS — C786 Secondary malignant neoplasm of retroperitoneum and peritoneum: Secondary | ICD-10-CM | POA: Insufficient documentation

## 2013-06-24 DIAGNOSIS — E7800 Pure hypercholesterolemia, unspecified: Secondary | ICD-10-CM | POA: Insufficient documentation

## 2013-06-24 DIAGNOSIS — C7931 Secondary malignant neoplasm of brain: Secondary | ICD-10-CM | POA: Insufficient documentation

## 2013-06-24 DIAGNOSIS — Z7982 Long term (current) use of aspirin: Secondary | ICD-10-CM | POA: Insufficient documentation

## 2013-06-24 LAB — CBC WITH DIFFERENTIAL/PLATELET
Basophils Absolute: 0 10*3/uL (ref 0.0–0.1)
Basophils Relative: 0 % (ref 0–1)
Eosinophils Relative: 0 % (ref 0–5)
HCT: 35.7 % — ABNORMAL LOW (ref 39.0–52.0)
Lymphocytes Relative: 4 % — ABNORMAL LOW (ref 12–46)
Lymphs Abs: 0.4 10*3/uL — ABNORMAL LOW (ref 0.7–4.0)
MCH: 28.6 pg (ref 26.0–34.0)
MCHC: 33.1 g/dL (ref 30.0–36.0)
MCV: 86.4 fL (ref 78.0–100.0)
Monocytes Absolute: 0.2 10*3/uL (ref 0.1–1.0)
RDW: 18.1 % — ABNORMAL HIGH (ref 11.5–15.5)
WBC: 9.8 10*3/uL (ref 4.0–10.5)

## 2013-06-24 LAB — URINALYSIS, ROUTINE W REFLEX MICROSCOPIC
Glucose, UA: 100 mg/dL — AB
Hgb urine dipstick: NEGATIVE
Ketones, ur: NEGATIVE mg/dL
Protein, ur: 30 mg/dL — AB

## 2013-06-24 LAB — COMPREHENSIVE METABOLIC PANEL
ALT: 18 U/L (ref 0–53)
AST: 29 U/L (ref 0–37)
Alkaline Phosphatase: 62 U/L (ref 39–117)
BUN: 39 mg/dL — ABNORMAL HIGH (ref 6–23)
CO2: 24 mEq/L (ref 19–32)
Chloride: 101 mEq/L (ref 96–112)
GFR calc Af Amer: 86 mL/min — ABNORMAL LOW (ref >90)
GFR calc non Af Amer: 74 mL/min — ABNORMAL LOW (ref >90)
Glucose, Bld: 211 mg/dL — ABNORMAL HIGH (ref 70–99)
Sodium: 135 mEq/L (ref 135–145)
Total Bilirubin: 0.3 mg/dL (ref 0.3–1.2)
Total Protein: 5.2 g/dL — ABNORMAL LOW (ref 6.0–8.3)

## 2013-06-24 LAB — URINE MICROSCOPIC-ADD ON

## 2013-06-24 MED ORDER — SODIUM CHLORIDE 0.9 % IV BOLUS (SEPSIS)
1000.0000 mL | Freq: Once | INTRAVENOUS | Status: AC
  Administered 2013-06-24: 1000 mL via INTRAVENOUS

## 2013-06-24 MED ORDER — ONDANSETRON HCL 4 MG/2ML IJ SOLN
4.0000 mg | Freq: Once | INTRAMUSCULAR | Status: AC
  Administered 2013-06-24: 4 mg via INTRAVENOUS
  Filled 2013-06-24: qty 2

## 2013-06-24 MED ORDER — HYDROMORPHONE HCL PF 1 MG/ML IJ SOLN
0.5000 mg | INTRAMUSCULAR | Status: AC | PRN
  Administered 2013-06-24 (×3): 0.5 mg via INTRAVENOUS
  Filled 2013-06-24 (×3): qty 1

## 2013-06-24 MED ORDER — IOHEXOL 300 MG/ML  SOLN
50.0000 mL | Freq: Once | INTRAMUSCULAR | Status: AC | PRN
  Administered 2013-06-24: 50 mL via ORAL

## 2013-06-24 MED ORDER — IOHEXOL 300 MG/ML  SOLN
100.0000 mL | Freq: Once | INTRAMUSCULAR | Status: AC | PRN
  Administered 2013-06-24: 100 mL via INTRAVENOUS

## 2013-06-24 MED ORDER — HYDROMORPHONE HCL PF 1 MG/ML IJ SOLN
0.5000 mg | Freq: Once | INTRAMUSCULAR | Status: AC
  Administered 2013-06-24: 0.5 mg via INTRAVENOUS
  Filled 2013-06-24: qty 1

## 2013-06-24 MED ORDER — SODIUM CHLORIDE 0.9 % IV SOLN
1000.0000 mL | INTRAVENOUS | Status: DC
  Administered 2013-06-24: 1000 mL via INTRAVENOUS

## 2013-06-24 NOTE — ED Notes (Signed)
IV insertion attempt x1 in left AC d/c d/t infiltration.

## 2013-06-24 NOTE — ED Notes (Signed)
Pt ambulated with standby assist to nearby restroom. Pt states pt has worsened with movement.

## 2013-06-24 NOTE — ED Notes (Addendum)
Per pt has been have right sided flank pain since Sunday that worsened today. Pt referred to come to ED by oncologist for "possible kidney blockage." Pt has hx of kidney cancer. Pt states "bloated and right side hurts when I stand." Pt denies dysuria or hematuria. Pt reports minimal urination. Pt denies n/v/d.

## 2013-06-24 NOTE — ED Provider Notes (Signed)
Signed out by Dr Lynelle Doctor at 1700 that pt has hx metastatic renal cell ca, and now suspected ascites, ct pending.   Ct results noted w mets to omentum, gastric mass, ascites.  Recheck pt, no fevers. abd soft nt.  Discussed pt with hem/onc on call for Dr Cyndie Chime - he indicates we could set up for paracentesis for comfort.  Discussed w pt/fam - agreeable.  Radiology unable to perform this evening - discussed w pts nurse - will set up for tomorrow during day.  Will also refer to close follow up with hem/onc.  Pt states does not need any pain and/or nausea meds for home, already has. Is tolerating po fluids in ed, no nv, pain controlled.  Pt w very poor prognosis given extensive met disease that has been progressive, however appears comfortable and stable for d/c.     Suzi Roots, MD 06/24/13 2011

## 2013-06-24 NOTE — Progress Notes (Signed)
Hematology and Oncology Follow Up Visit  George [name] [id number] July 14, 1946 67 y.o. 06/24/2013 2:15 PM   Principle Diagnosis: Encounter Diagnoses  Name Primary?  . Brain metastases Yes  . Right renal mass   . Lung metastases, unspecified laterality   . 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 He has a very large primary tumor in the right kidney. In addition to the cerebral and cerebellar brain metastases, he also has bilateral lung metastases. He completed a course of cranial radiation. He had an interim hospital admission for recurrent seizures and his antiseizure medication was adjusted. I started him on Sutent with a standard schedule of 50 mg daily 4 weeks on 2 weeks off beginning on November 6. So far he has tolerated the drug well except for increasing fatigue. Hard to know how much of this is drug related and how much is tumor related.  He continues to have abdominal discomfort and bloating due to the large primary tumor which is clinically palpable on exam. He is getting relief with oxycodone and not taking more than 2 or 3 tablets daily at this point.   Medications: reviewed  Allergies:  Allergies  Allergen Reactions  . Lipitor [Atorvastatin] Other (See Comments)    Made him feel weak  . Metformin And Related Diarrhea    Review of Systems:  ENT ROS: No sore throat Breast ROS:  Respiratory ROS: No cough or dyspnea at rest. Dyspnea with exertion. Cardiovascular ROS:   No ischemic type chest pain or pressure. No palpitations Gastrointestinal ROS:   See history of present illness Genito-Urinary ROS: No urinary tract symptoms Musculoskeletal ROS: No muscle or bone pain Neurological ROS: No headaches. No recurrent seizures. No focal  weakness. No slurred speech. Dermatological ROS: No rash or ecchymosis  Remaining ROS negative.  Physical Exam: Blood pressure 173/84, pulse 86, temperature 97.5 F (36.4 C), temperature source Oral, resp. rate 18, height 6\' 2"  (1.88 m), weight 225 lb 6.4 oz (102.241 kg), SpO2 98.00%. Wt Readings from Last 3 Encounters:  06/21/13 225 lb 6.4 oz (102.241 kg)  05/23/13 214 lb 9.6 oz (97.342 kg)  05/19/13 211 lb 11.2 oz (96.026 kg)     General appearance: Adequately nourished Caucasian man HENNT: Pharynx no erythema, exudate, mass, or ulcer. No thyromegaly or thyroid nodules Lymph nodes: No cervical, supraclavicular, or axillary lymphadenopathy Breasts:  Lungs: Clear to auscultation, resonant to percussion throughout Heart: Regular rhythm, no murmur, no gallop, no rub, no click, no edema Abdomen: Soft, nontender, normal bowel sounds, right lower cord and/flank mass less prominent on today's exam,  Extremities: No edema, no calf tenderness Musculoskeletal: no joint deformities GU:  Vascular: Carotid pulses 2+, no bruits,  Neurologic: Alert, oriented, PERRLA, optic discs sharp and vessels normal, no hemorrhage or exudate, cranial nerves grossly normal, motor strength 5 over 5, reflexes 1+ symmetric, upper body coordination normal, gait normal, Skin: No rash or ecchymosis  Lab Results: CBC W/Diff    Component Value Date/Time   WBC 7.3 06/21/2013 1551   WBC 7.9 05/03/2013 0508   RBC 3.73* 06/21/2013 1551   RBC 3.40* 05/03/2013 0508   HGB 10.3* 06/21/2013 1551   HGB 9.8* 05/03/2013 0508   HCT 32.3* 06/21/2013 1551   HCT 28.8* 05/03/2013 4098  PLT 260 06/21/2013 1551   PLT 162 05/03/2013 0508   MCV 86.6 06/21/2013 1551   MCV 84.7 05/03/2013 0508   MCH 27.6 06/21/2013 1551   MCH 28.8 05/03/2013 0508   MCHC 31.9* 06/21/2013 1551   MCHC 34.0 05/03/2013 0508   RDW 18.4* 06/21/2013 1551   RDW 15.4 05/03/2013 0508   LYMPHSABS 0.3* 06/21/2013 1551   LYMPHSABS 0.6* 05/02/2013 0635   MONOABS  0.2 06/21/2013 1551   MONOABS 0.3 05/02/2013 0635   EOSABS 0.0 06/21/2013 1551   EOSABS 0.0 05/02/2013 0635   BASOSABS 0.0 06/21/2013 1551   BASOSABS 0.0 05/02/2013 0635     Chemistry      Component Value Date/Time   NA 139 06/21/2013 1551   NA 136 05/03/2013 0508   K 4.6 06/21/2013 1551   K 4.0 05/03/2013 0508   CL 101 05/03/2013 0508   CO2 23 06/21/2013 1551   CO2 26 05/03/2013 0508   BUN 23.5 06/21/2013 1551   BUN 19 05/03/2013 0508   CREATININE 0.8 06/21/2013 1551   CREATININE 0.66 05/03/2013 0508      Component Value Date/Time   CALCIUM 8.6 06/21/2013 1551   CALCIUM 9.1 05/03/2013 0508   ALKPHOS 73 06/21/2013 1551   ALKPHOS 81 05/02/2013 0635   AST 8 06/21/2013 1551   AST 7 05/02/2013 0635   ALT 16 06/21/2013 1551   ALT 13 05/02/2013 0635   BILITOT 0.35 06/21/2013 1551   BILITOT 0.2* 05/02/2013 1610         Impression: #1. Widely metastatic kidney cancer He is tolerating Sutent so far. Blood counts are stable. 2 early to assess response. If he is otherwise clinically stable, I will wait to lift her 2 full cycles to repeat scans. (12 weeks).  #2. Cerebral and cerebellar metastases secondary to #1  #3. Seizure disorder secondary to #2  #4. Insulin-dependent diabetes  #5. 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

## 2013-06-24 NOTE — Telephone Encounter (Addendum)
Received vm from pt's wife stating that pt is not producing urine.  She states that he hasn't urinated for 12 hours or longer & when he did it was very little.  She reports that she is very concerned.  Call back # is [phone].  Note to Dr Cyndie Chime.  Reported to Dr Cyndie Chime @ 10:30 am & instructions were to have pt got to ED to be evaluated for possible obstruction.  Called wife, Kathie Rhodes & she reports that she has talked with husband (she is at work) & he states that he voided a good amt. She doesn't know exactly how much.  She states he is drinking plenty of fluids & usually voids several times during the night.  Encouraged to monitor closely & not to wait too late if this continues.  She expressed understanding.  Called pt back @ 11:30 am per Dr Cyndie Chime & informed pt's wife to call urologist to see if he could be seen there instead of ED.  She states pt did not have a urologist.  Reviewed hospital notes & Dr Mena Goes saw pt in hospital.  His phone # was given to wife & instructed to call & let them know he was not established in the office but was seen in the hospital & explain situation & if unable to get in will need to go to the ED if not voiding adequately. Pt's wife called back @ 11:42 & reports that pt does have a urologist & it is Dr Isabel Caprice.  They will contact him.

## 2013-06-24 NOTE — ED Provider Notes (Signed)
CSN: [id number]     Arrival date & time 06/24/13  1355 History   First MD Initiated Contact with Patient 06/24/13 1421     Chief Complaint  Patient presents with  . Flank Pain    HPI Patient has a significant medical history including metastatic renal cell carcinoma. Patient presents to the emergency room with complaints of abdominal pain associated with bloating over the last few days. The symptoms have been gradually increasing. The patient feels like he's had increasing difficulty urinating. He has not had any trouble with nausea or vomiting but has developed pain in the right flank area. He also has pain in his lower abdomen.  He feels bloated and distended. Patient called his oncologist and was told to come to the emergency department to evaluate possible kidney blockage. Past Medical History  Diagnosis Date  . Coronary artery disease   . Diabetes mellitus without complication   . Hyperlipidemia   . Cancer     kidney, mets to brain and lung  . 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 Hx of radiation therapy 04/07/13-04/20/13    whole brain/r side renal tumor   . Hypercholesteremia   . Coronary atherosclerosis of native coronary artery   . Osteoarthritis   . HTN (hypertension)   . Seizure    Past Surgical History  Procedure Laterality Date  . Cardiac catheterization  03/03    2 stents  . Biopsy of kidney Right 04/05/2013  . Knee surgery Left   . Hemorroidectomy     Family History  Problem Relation Age of Onset  . Skin cancer Father   . Thyroid cancer Brother   . Kidney cancer Maternal Uncle    History  Substance Use Topics  . Smoking status: Former Smoker -- 0.50 packs/day    Types: Cigarettes    Quit date: 03/28/2013  . Smokeless tobacco: Not on file  . Alcohol Use: No    Review of Systems  All other systems reviewed and are negative.    Allergies  Lipitor and Metformin and related  Home  Medications   Current Outpatient Rx  Name  Route  Sig  Dispense  Refill  . aspirin 325 MG EC tablet   Oral   Take 1 tablet (325 mg total) by mouth daily.         Marland Kitchen dexamethasone (DECADRON) 4 MG tablet   Oral   Take 1 tablet (4 mg total) by mouth 2 (two) times daily.   60 tablet   4   . fish oil-omega-3 fatty acids 1000 MG capsule   Oral   Take 1 g by mouth daily.         . insulin aspart (NOVOLOG) 100 UNIT/ML injection      Before each meal 3 times a day, 140-199 - 2 units, 200-250 - 4 units, 251-299 - 6 units,  300-349 - 8 units,  350 or above 10 units. Insulin PEN if approved, provide syringes and needles if needed.   1 vial   12   . insulin glargine (LANTUS) 100 UNIT/ML injection   Subcutaneous   Inject 80 Units into the skin at bedtime.          . levETIRAcetam (KEPPRA) 1000 MG tablet   Oral   Take 1 tablet (1,000 mg total) by mouth 2 (two) times daily.   60 tablet   4   . nystatin (MYCOSTATIN) 100000 UNIT/ML suspension  Oral   Take 5 mLs (500,000 Units total) by mouth 4 (four) times daily.   180 mL   0   . pantoprazole (PROTONIX) 40 MG tablet      TAKE 1 TABLET (40 MG TOTAL) BY MOUTH DAILY AT 12 NOON.   30 tablet   3     No refills available   . psyllium (REGULOID) 0.52 G capsule   Oral   Take 0.52 g by mouth daily.         . simvastatin (ZOCOR) 80 MG tablet   Oral   Take 80 mg by mouth at bedtime.         . SUNItinib (SUTENT) 50 MG capsule   Oral   Take 1 capsule (50 mg total) by mouth daily.   28 capsule   0     Take for 28 days and then 2 weeks off    BP 161/89  Pulse 96  Resp 18  SpO2 96% Physical Exam  Nursing note and vitals reviewed. Constitutional: He appears well-developed and well-nourished. No distress.  HENT:  Head: Normocephalic and atraumatic.  Right Ear: External ear normal.  Left Ear: External ear normal.  Eyes: Conjunctivae are normal. Right eye exhibits no discharge. Left eye exhibits no discharge. No  scleral icterus.  Neck: Neck supple. No tracheal deviation present.  Cardiovascular: Normal rate, regular rhythm and intact distal pulses.   Pulmonary/Chest: Effort normal and breath sounds normal. No stridor. No respiratory distress. He has no wheezes. He has no rales.  Abdominal: Soft. Bowel sounds are normal. He exhibits distension and ascites (possible). He exhibits no pulsatile midline mass. There is generalized tenderness. There is no rebound and no guarding. No hernia.  Musculoskeletal: He exhibits no edema and no tenderness.  Neurological: He is alert. He has normal strength. No sensory deficit. Cranial nerve deficit:  no gross defecits noted. He exhibits normal muscle tone. He displays no seizure activity. Coordination normal.  Skin: Skin is warm and dry. No rash noted.  Psychiatric: He has a normal mood and affect.    ED Course  Procedures (including critical care time) Labs Review Labs Reviewed  COMPREHENSIVE METABOLIC PANEL  LIPASE, BLOOD  URINALYSIS, ROUTINE W REFLEX MICROSCOPIC  CBC WITH DIFFERENTIAL  URINALYSIS, ROUTINE W REFLEX MICROSCOPIC   Imaging Review No results found.  EKG Interpretation   None     Bedside US: ?ascites 1605  Foley catheter placed by nurse.  100 dark urine.  No bladder outlet obstruction.  Will check labs to assess for acute kidney injury.  Will need to check cr but will need ct to evaluate for his abdominal distension.  MDM   Concerning for acute kidney injury and possible ascites associated with his kidney ca.  Will turn over to Dr Denton Lank    Celene Kras, MD 06/24/13 [phone]

## 2013-06-24 NOTE — ED Notes (Signed)
Bed: WA18 Expected date:  Expected time:  Means of arrival:  Comments: HOLD FOR RES A 

## 2013-06-25 ENCOUNTER — Ambulatory Visit (HOSPITAL_COMMUNITY)
Admission: RE | Admit: 2013-06-25 | Discharge: 2013-06-25 | Disposition: A | Discharge status: Home / Self Care | Payer: Medicare Other | Source: Ambulatory Visit | Attending: Emergency Medicine | Admitting: Emergency Medicine

## 2013-06-25 VITALS — BP 129/75

## 2013-06-25 DIAGNOSIS — C649 Malignant neoplasm of unspecified kidney, except renal pelvis: Secondary | ICD-10-CM

## 2013-06-25 DIAGNOSIS — R188 Other ascites: Secondary | ICD-10-CM | POA: Insufficient documentation

## 2013-06-25 NOTE — Procedures (Signed)
US guided therapeutic paracentesis performed yielding 4.2 liters slightly turbid, amber fluid. No immediate complications.

## 2013-06-27 ENCOUNTER — Telehealth: Payer: Self-pay | Admitting: *Deleted

## 2013-06-27 NOTE — Telephone Encounter (Signed)
Received vm call from wife, George [name] stating that pt was seen in the ED fri & went back Sat. She reports that pt had US guided paracentesis & pt feels better & they were advised to call for f/u. Pt has f/u in January with Dr. Cyndie Chime & Dec 8 with radiation onc.  Note to Dr Cyndie Chime to see if needs to be seen any sooner.

## 2013-06-30 ENCOUNTER — Emergency Department (HOSPITAL_COMMUNITY)
Admission: EM | Admit: 2013-06-30 | Discharge: 2013-06-30 | Disposition: A | Discharge status: Home / Self Care | Payer: Medicare Other | Attending: Emergency Medicine | Admitting: Emergency Medicine

## 2013-06-30 ENCOUNTER — Encounter (HOSPITAL_COMMUNITY): Payer: Self-pay | Admitting: Emergency Medicine

## 2013-06-30 DIAGNOSIS — I2510 Atherosclerotic heart disease of native coronary artery without angina pectoris: Secondary | ICD-10-CM | POA: Insufficient documentation

## 2013-06-30 DIAGNOSIS — Z85841 Personal history of malignant neoplasm of brain: Secondary | ICD-10-CM | POA: Insufficient documentation

## 2013-06-30 DIAGNOSIS — E785 Hyperlipidemia, unspecified: Secondary | ICD-10-CM | POA: Insufficient documentation

## 2013-06-30 DIAGNOSIS — G40909 Epilepsy, unspecified, not intractable, without status epilepticus: Secondary | ICD-10-CM | POA: Insufficient documentation

## 2013-06-30 DIAGNOSIS — Z95818 Presence of other cardiac implants and grafts: Secondary | ICD-10-CM | POA: Insufficient documentation

## 2013-06-30 DIAGNOSIS — R188 Other ascites: Secondary | ICD-10-CM | POA: Insufficient documentation

## 2013-06-30 DIAGNOSIS — Z85528 Personal history of other malignant neoplasm of kidney: Secondary | ICD-10-CM | POA: Insufficient documentation

## 2013-06-30 DIAGNOSIS — E119 Type 2 diabetes mellitus without complications: Secondary | ICD-10-CM | POA: Insufficient documentation

## 2013-06-30 DIAGNOSIS — Z87891 Personal history of nicotine dependence: Secondary | ICD-10-CM | POA: Insufficient documentation

## 2013-06-30 DIAGNOSIS — Z794 Long term (current) use of insulin: Secondary | ICD-10-CM | POA: Insufficient documentation

## 2013-06-30 DIAGNOSIS — Z85118 Personal history of other malignant neoplasm of bronchus and lung: Secondary | ICD-10-CM | POA: Insufficient documentation

## 2013-06-30 DIAGNOSIS — Z79899 Other long term (current) drug therapy: Secondary | ICD-10-CM | POA: Insufficient documentation

## 2013-06-30 DIAGNOSIS — Z7982 Long term (current) use of aspirin: Secondary | ICD-10-CM | POA: Insufficient documentation

## 2013-06-30 DIAGNOSIS — IMO0002 Reserved for concepts with insufficient information to code with codable children: Secondary | ICD-10-CM | POA: Insufficient documentation

## 2013-06-30 DIAGNOSIS — M1990 Unspecified osteoarthritis, unspecified site: Secondary | ICD-10-CM | POA: Insufficient documentation

## 2013-06-30 DIAGNOSIS — I10 Essential (primary) hypertension: Secondary | ICD-10-CM | POA: Insufficient documentation

## 2013-06-30 DIAGNOSIS — E7800 Pure hypercholesterolemia, unspecified: Secondary | ICD-10-CM | POA: Insufficient documentation

## 2013-06-30 DIAGNOSIS — Z923 Personal history of irradiation: Secondary | ICD-10-CM | POA: Insufficient documentation

## 2013-06-30 LAB — PROTIME-INR
INR: 1.1 (ref 0.00–1.49)
Prothrombin Time: 14 seconds (ref 11.6–15.2)

## 2013-06-30 LAB — CBC WITH DIFFERENTIAL/PLATELET
Basophils Relative: 0 % (ref 0–1)
HCT: 35.1 % — ABNORMAL LOW (ref 39.0–52.0)
Hemoglobin: 11.1 g/dL — ABNORMAL LOW (ref 13.0–17.0)
Lymphocytes Relative: 7 % — ABNORMAL LOW (ref 12–46)
MCH: 28.5 pg (ref 26.0–34.0)
MCHC: 31.6 g/dL (ref 30.0–36.0)
Monocytes Absolute: 0.1 10*3/uL (ref 0.1–1.0)
Monocytes Relative: 3 % (ref 3–12)
Neutro Abs: 4.1 10*3/uL (ref 1.7–7.7)
WBC: 4.6 10*3/uL (ref 4.0–10.5)

## 2013-06-30 LAB — COMPREHENSIVE METABOLIC PANEL
BUN: 22 mg/dL (ref 6–23)
CO2: 24 mEq/L (ref 19–32)
Chloride: 106 mEq/L (ref 96–112)
Creatinine, Ser: 0.76 mg/dL (ref 0.50–1.35)
GFR calc Af Amer: >90 mL/min (ref >90)
GFR calc non Af Amer: >90 mL/min (ref >90)
Glucose, Bld: 175 mg/dL — ABNORMAL HIGH (ref 70–99)
Total Bilirubin: 0.2 mg/dL — ABNORMAL LOW (ref 0.3–1.2)

## 2013-06-30 MED ORDER — MORPHINE SULFATE 4 MG/ML IJ SOLN
6.0000 mg | Freq: Once | INTRAMUSCULAR | Status: AC
  Administered 2013-06-30: 6 mg via INTRAVENOUS
  Filled 2013-06-30: qty 2

## 2013-06-30 MED ORDER — HYDROCODONE-ACETAMINOPHEN 5-325 MG PO TABS: 2.0000 | ORAL_TABLET | Freq: Four times a day (QID) | ORAL | Status: DC | PRN

## 2013-06-30 NOTE — ED Notes (Signed)
Patient made aware of discharge status and that IR will f/u with patient tomorrow morning to arrange OP intervention Patient's wife not present in ED at this time, but will be returning shortly per patient Will discharge patient when wife returns  Patient in NAD Side rails up, call bell in reach

## 2013-06-30 NOTE — ED Notes (Signed)
PIV placed and labs drawn, sent PA at bedside to assess patient

## 2013-06-30 NOTE — ED Provider Notes (Signed)
Medical screening examination/treatment/procedure(s) were conducted as a shared visit with non-physician practitioner(s) and myself.  I personally evaluated the patient during the encounter.  EKG Interpretation   None        Patient here with ascites. Hx of metastatic RCC. Had therapeutic paracentesis recently, now ascites has reaccumulated and is causing discomfort. No gross abdominal pain. No fevers, no N/V/D.  Belly soft. Labs sent, IR will contact patient for outpatient paracentesis tmw.  Dagmar Hait, MD 06/30/13 2032

## 2013-06-30 NOTE — ED Notes (Signed)
Patient states that he was seen last week in ED for ascites and required a paracentesis where "they removed four quarts." Patient states that he feels that he needs another paracentesis r/t abdominal distention Patient denies c/o pain upon palpation Patient denies DOE/SOB LCTA Patient appears in NAD

## 2013-06-30 NOTE — ED Provider Notes (Signed)
CSN: [id number]     Arrival date & time 06/30/13  1523 History   First MD Initiated Contact with Patient 06/30/13 1535     Chief Complaint  Patient presents with  . Abdominal Pain   (Consider location/radiation/quality/duration/timing/severity/associated sxs/prior Treatment) HPI  67 year old male with history of metastatic renal cell carcinoma to brain lung and omentum presents for evaluations of abdominal pain. Pt recently had US guided paracentesis on 06/24/2013 yielding 4.2 liter of ascites.  Patient state the paracentesis did provide relief. However he has developed gradual onset of achy generalized abdominal pain with reaccumulation of fluid in his abdomen. He is here today because the pain is worse, and requesting for another paracentesis. The pain is currently 8/10, worsening when he sits up and move around, pain did improve with taking home pain medication but he only has a few pills left. Otherwise patient denies fever, chills, chest pain, shortness of breath, productive cough, nausea, vomiting, diarrhea, loss of appetite, dysuria , or rash. No other complaint.   Past Medical History  Diagnosis Date  . Coronary artery disease   . Diabetes mellitus without complication   . Hyperlipidemia   . Cancer     kidney, mets to brain and lung  . 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 Hx of radiation therapy 04/07/13-04/20/13    whole brain/r side renal tumor   . Hypercholesteremia   . Coronary atherosclerosis of native coronary artery   . Osteoarthritis   . HTN (hypertension)   . Seizure    Past Surgical History  Procedure Laterality Date  . Cardiac catheterization  03/03    2 stents  . Biopsy of kidney Right 04/05/2013  . Knee surgery Left   . Hemorroidectomy     Family History  Problem Relation Age of Onset  . Skin cancer Father   . Thyroid cancer Brother   . Kidney cancer Maternal Uncle    History   Substance Use Topics  . Smoking status: Former Smoker -- 0.50 packs/day    Types: Cigarettes    Quit date: 03/28/2013  . Smokeless tobacco: Not on file  . Alcohol Use: No    Review of Systems  All other systems reviewed and are negative.    Allergies  Lipitor and Metformin and related  Home Medications   Current Outpatient Rx  Name  Route  Sig  Dispense  Refill  . aspirin 325 MG EC tablet   Oral   Take 1 tablet (325 mg total) by mouth daily.         Marland Kitchen dexamethasone (DECADRON) 4 MG tablet   Oral   Take 1 tablet (4 mg total) by mouth 2 (two) times daily.   60 tablet   4   . fish oil-omega-3 fatty acids 1000 MG capsule   Oral   Take 1 g by mouth daily.         . insulin aspart (NOVOLOG) 100 UNIT/ML injection      Before each meal 3 times a day, 140-199 - 2 units, 200-250 - 4 units, 251-299 - 6 units,  300-349 - 8 units,  350 or above 10 units. Insulin PEN if approved, provide syringes and needles if needed.   1 vial   12   . insulin glargine (LANTUS) 100 UNIT/ML injection   Subcutaneous   Inject 80 Units into the skin at bedtime.          . levETIRAcetam (  KEPPRA) 1000 MG tablet   Oral   Take 1 tablet (1,000 mg total) by mouth 2 (two) times daily.   60 tablet   4   . nystatin (MYCOSTATIN) 100000 UNIT/ML suspension   Oral   Take 5 mLs (500,000 Units total) by mouth 4 (four) times daily.   180 mL   0   . pantoprazole (PROTONIX) 40 MG tablet      TAKE 1 TABLET (40 MG TOTAL) BY MOUTH DAILY AT 12 NOON.   30 tablet   3     No refills available   . psyllium (REGULOID) 0.52 G capsule   Oral   Take 0.52 g by mouth daily.         . simvastatin (ZOCOR) 80 MG tablet   Oral   Take 80 mg by mouth at bedtime.         . SUNItinib (SUTENT) 50 MG capsule   Oral   Take 50 mg by mouth See admin instructions. Starting on 06/09/13, takes daily x 4 weeks, off for 2 weeks, then repeat.          BP 172/100  Pulse 101  Temp(Src) 97.5 F (36.4 C) (Oral)   Resp 20  SpO2 95% Physical Exam  Constitutional: He is oriented to person, place, and time. He appears well-developed and well-nourished. No distress.  HENT:  Head: Atraumatic.  Mouth/Throat: Oropharynx is clear and moist.  Eyes: Conjunctivae are normal.  Neck: Normal range of motion. Neck supple. No JVD present.  Cardiovascular: Normal rate and regular rhythm.  Exam reveals no gallop and no friction rub.   No murmur heard. Pulmonary/Chest: Effort normal and breath sounds normal. He has no wheezes. He has no rales.  Abdominal: Soft. He exhibits distension (Abdomen is moderately distended, tympanic.). There is tenderness (Mild generalized abdominal tenderness without guarding or rebound tenderness. No focal point tenderness.). There is no rebound and no guarding.  Musculoskeletal: He exhibits no edema.  Neurological: He is alert and oriented to person, place, and time.  Skin: No rash noted.  Psychiatric: He has a normal mood and affect.    ED Course  Procedures (including critical care time)  3:59 PM Patient here with reaccumulation of his ascites. No fever and no point tenderness concerning for spontaneous bacterial peritonitis. Will obtain basic labs and will consult IR for symptomatic paracentesis   4:44 PM Pt has normal coagulation.  Pain currently controlled.  Is afebrile, stable vital sign.  I have consulted IR, who will contact pt tomorrow for outpt paracentesis.  Care discussed with attending.     Labs Review Labs Reviewed  CBC WITH DIFFERENTIAL - Abnormal; Notable for the following:    RBC 3.89 (*)    Hemoglobin 11.1 (*)    HCT 35.1 (*)    RDW 19.9 (*)    Neutrophils Relative % 89 (*)    Lymphocytes Relative 7 (*)    Lymphs Abs 0.3 (*)    All other components within normal limits  COMPREHENSIVE METABOLIC PANEL - Abnormal; Notable for the following:    Glucose, Bld 175 (*)    Calcium 8.1 (*)    Total Protein 5.0 (*)    Albumin 2.4 (*)    Total Bilirubin 0.2 (*)     All other components within normal limits  PROTIME-INR   Imaging Review No results found.  EKG Interpretation   None       MDM   1. Ascites    BP 145/72  Pulse  86  Temp(Src) 97.5 F (36.4 C) (Oral)  Resp 20  SpO2 94%  I have reviewed nursing notes and vital signs. I reviewed available ER/hospitalization records thought the EMR     Fayrene Helper, [place] 06/30/13 1658

## 2013-07-01 ENCOUNTER — Other Ambulatory Visit: Payer: Self-pay | Admitting: Oncology

## 2013-07-01 ENCOUNTER — Ambulatory Visit (HOSPITAL_COMMUNITY)
Admission: RE | Admit: 2013-07-01 | Discharge: 2013-07-01 | Disposition: A | Discharge status: Home / Self Care | Payer: Medicare Other | Source: Ambulatory Visit | Attending: Emergency Medicine | Admitting: Emergency Medicine

## 2013-07-01 ENCOUNTER — Telehealth: Payer: Self-pay | Admitting: Oncology

## 2013-07-01 VITALS — BP 121/68

## 2013-07-01 DIAGNOSIS — C801 Malignant (primary) neoplasm, unspecified: Secondary | ICD-10-CM | POA: Insufficient documentation

## 2013-07-01 DIAGNOSIS — C649 Malignant neoplasm of unspecified kidney, except renal pelvis: Secondary | ICD-10-CM | POA: Insufficient documentation

## 2013-07-01 DIAGNOSIS — R188 Other ascites: Secondary | ICD-10-CM | POA: Insufficient documentation

## 2013-07-01 NOTE — Procedures (Signed)
US guided therapeutic paracentesis performed yielding 5.3 liters yellow fluid. No immediate complications.  

## 2013-07-01 NOTE — Telephone Encounter (Signed)
s.w. pt wife and advised on 12.1.14 appt...pt ok and aware

## 2013-07-04 ENCOUNTER — Other Ambulatory Visit: Payer: Self-pay | Admitting: *Deleted

## 2013-07-04 ENCOUNTER — Encounter (HOSPITAL_COMMUNITY): Payer: Self-pay | Admitting: Emergency Medicine

## 2013-07-04 ENCOUNTER — Ambulatory Visit (HOSPITAL_BASED_OUTPATIENT_CLINIC_OR_DEPARTMENT_OTHER): Payer: Medicare Other | Admitting: Nurse Practitioner

## 2013-07-04 ENCOUNTER — Emergency Department (HOSPITAL_COMMUNITY): Payer: Medicare Other

## 2013-07-04 ENCOUNTER — Emergency Department (HOSPITAL_COMMUNITY)
Admission: EM | Admit: 2013-07-04 | Discharge: 2013-07-04 | Disposition: A | Discharge status: Home / Self Care | Payer: Medicare Other | Attending: Emergency Medicine | Admitting: Emergency Medicine

## 2013-07-04 VITALS — BP 139/71 | HR 70 | Temp 98.8°F | Resp 20 | Ht 74.0 in | Wt 238.2 lb

## 2013-07-04 DIAGNOSIS — E119 Type 2 diabetes mellitus without complications: Secondary | ICD-10-CM | POA: Insufficient documentation

## 2013-07-04 DIAGNOSIS — G40909 Epilepsy, unspecified, not intractable, without status epilepticus: Secondary | ICD-10-CM

## 2013-07-04 DIAGNOSIS — Z794 Long term (current) use of insulin: Secondary | ICD-10-CM | POA: Insufficient documentation

## 2013-07-04 DIAGNOSIS — Z85118 Personal history of other malignant neoplasm of bronchus and lung: Secondary | ICD-10-CM | POA: Insufficient documentation

## 2013-07-04 DIAGNOSIS — R188 Other ascites: Secondary | ICD-10-CM | POA: Insufficient documentation

## 2013-07-04 DIAGNOSIS — Z95818 Presence of other cardiac implants and grafts: Secondary | ICD-10-CM | POA: Insufficient documentation

## 2013-07-04 DIAGNOSIS — Z7982 Long term (current) use of aspirin: Secondary | ICD-10-CM | POA: Insufficient documentation

## 2013-07-04 DIAGNOSIS — C7931 Secondary malignant neoplasm of brain: Secondary | ICD-10-CM

## 2013-07-04 DIAGNOSIS — M1990 Unspecified osteoarthritis, unspecified site: Secondary | ICD-10-CM | POA: Insufficient documentation

## 2013-07-04 DIAGNOSIS — I10 Essential (primary) hypertension: Secondary | ICD-10-CM | POA: Insufficient documentation

## 2013-07-04 DIAGNOSIS — Z79899 Other long term (current) drug therapy: Secondary | ICD-10-CM | POA: Insufficient documentation

## 2013-07-04 DIAGNOSIS — C649 Malignant neoplasm of unspecified kidney, except renal pelvis: Secondary | ICD-10-CM

## 2013-07-04 DIAGNOSIS — R180 Malignant ascites: Secondary | ICD-10-CM

## 2013-07-04 DIAGNOSIS — Z87891 Personal history of nicotine dependence: Secondary | ICD-10-CM | POA: Insufficient documentation

## 2013-07-04 DIAGNOSIS — E785 Hyperlipidemia, unspecified: Secondary | ICD-10-CM | POA: Insufficient documentation

## 2013-07-04 DIAGNOSIS — E7800 Pure hypercholesterolemia, unspecified: Secondary | ICD-10-CM | POA: Insufficient documentation

## 2013-07-04 DIAGNOSIS — Z923 Personal history of irradiation: Secondary | ICD-10-CM | POA: Insufficient documentation

## 2013-07-04 DIAGNOSIS — I2510 Atherosclerotic heart disease of native coronary artery without angina pectoris: Secondary | ICD-10-CM | POA: Insufficient documentation

## 2013-07-04 MED ORDER — HYDROMORPHONE HCL PF 1 MG/ML IJ SOLN
1.0000 mg | Freq: Once | INTRAMUSCULAR | Status: AC
  Administered 2013-07-04: 1 mg via INTRAMUSCULAR
  Filled 2013-07-04: qty 1

## 2013-07-04 MED ORDER — ONDANSETRON 8 MG PO TBDP
8.0000 mg | ORAL_TABLET | Freq: Once | ORAL | Status: AC
  Administered 2013-07-04: 8 mg via ORAL
  Filled 2013-07-04: qty 1

## 2013-07-04 MED ORDER — OXYCODONE HCL 10 MG PO TABS: 5.0000 mg | ORAL_TABLET | ORAL | Status: AC | PRN

## 2013-07-04 NOTE — ED Notes (Signed)
[place] states will get pt at 1300 for his paracentesis

## 2013-07-04 NOTE — ED Provider Notes (Signed)
CSN: [id number]     Arrival date & time 07/04/13  1033 History   First MD Initiated Contact with Patient 07/04/13 1058     Chief Complaint  Patient presents with  . abdominal distention    (Consider location/radiation/quality/duration/timing/severity/associated sxs/prior Treatment) HPI....Marland Kitchen complains of abdominal distention for several days. Patient has metastatic right renal cell carcinoma.  He has had a paracentesis x2 in the past 10 days.   He is now uncomfortable from recurrent ascites. Palpation and positioning makes pain worse. Severity is moderate to severe.  Past Medical History  Diagnosis Date  . Coronary artery disease   . Diabetes mellitus without complication   . Hyperlipidemia   . Cancer     kidney, mets to brain and lung  . 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 Hx of radiation therapy 04/07/13-04/20/13    whole brain/r side renal tumor   . Hypercholesteremia   . Coronary atherosclerosis of native coronary artery   . Osteoarthritis   . HTN (hypertension)   . Seizure    Past Surgical History  Procedure Laterality Date  . Cardiac catheterization  03/03    2 stents  . Biopsy of kidney Right 04/05/2013  . Knee surgery Left   . Hemorroidectomy     Family History  Problem Relation Age of Onset  . Skin cancer Father   . Thyroid cancer Brother   . Kidney cancer Maternal Uncle    History  Substance Use Topics  . Smoking status: Former Smoker -- 0.50 packs/day    Types: Cigarettes    Quit date: 03/28/2013  . Smokeless tobacco: Not on file  . Alcohol Use: No    Review of Systems  All other systems reviewed and are negative.    Allergies  Lipitor and Metformin and related  Home Medications   Current Outpatient Rx  Name  Route  Sig  Dispense  Refill  . aspirin 325 MG EC tablet   Oral   Take 1 tablet (325 mg total) by mouth daily.         Marland Kitchen dexamethasone (DECADRON) 4 MG tablet    Oral   Take 1 tablet (4 mg total) by mouth 2 (two) times daily.   60 tablet   4   . fish oil-omega-3 fatty acids 1000 MG capsule   Oral   Take 1 g by mouth daily.         Marland Kitchen HYDROcodone-acetaminophen (NORCO/VICODIN) 5-325 MG per tablet   Oral   Take 2 tablets by mouth every 6 (six) hours as needed for moderate pain.   20 tablet   0   . insulin aspart (NOVOLOG) 100 UNIT/ML injection      Before each meal 3 times a day, 140-199 - 2 units, 200-250 - 4 units, 251-299 - 6 units,  300-349 - 8 units,  350 or above 10 units. Insulin PEN if approved, provide syringes and needles if needed.   1 vial   12   . insulin glargine (LANTUS) 100 UNIT/ML injection   Subcutaneous   Inject 72 Units into the skin at bedtime.          . levETIRAcetam (KEPPRA) 1000 MG tablet   Oral   Take 1 tablet (1,000 mg total) by mouth 2 (two) times daily.   60 tablet   4   . pantoprazole (PROTONIX) 40 MG tablet      TAKE 1 TABLET (40 MG TOTAL)  BY MOUTH DAILY AT 12 NOON.   30 tablet   3     No refills available   . psyllium (REGULOID) 0.52 G capsule   Oral   Take 0.52 g by mouth 2 (two) times daily.          . simvastatin (ZOCOR) 80 MG tablet   Oral   Take 80 mg by mouth daily.          . SUNItinib (SUTENT) 50 MG capsule   Oral   Take 50 mg by mouth See admin instructions. Starting on 06/09/13, takes daily x 4 weeks, off for 2 weeks, then repeat.          BP 135/64  Pulse 86  Temp(Src) 97.8 F (36.6 C) (Oral)  Resp 16  SpO2 100% Physical Exam  Nursing note and vitals reviewed. Constitutional: He is oriented to person, place, and time. He appears well-developed and well-nourished.  HENT:  Head: Normocephalic and atraumatic.  Eyes: Conjunctivae and EOM are normal. Pupils are equal, round, and reactive to light.  Neck: Normal range of motion. Neck supple.  Cardiovascular: Normal rate, regular rhythm and normal heart sounds.   Pulmonary/Chest: Effort normal and breath sounds normal.   Abdominal: Soft. Bowel sounds are normal.  ascitic abdomen  Musculoskeletal: Normal range of motion.  Neurological: He is alert and oriented to person, place, and time.  Skin: Skin is warm and dry.  Psychiatric: He has a normal mood and affect. His behavior is normal.    ED Course  Procedures (including critical care time) Labs Review Labs Reviewed - No data to display Imaging Review US Paracentesis  07/04/2013   CLINICAL DATA:  Renal cancer  EXAM: ULTRASOUND GUIDED  RLQ PARACENTESIS  COMPARISON:  Previous paracentesis  PROCEDURE: An ultrasound guided paracentesis was thoroughly discussed with the patient and questions answered. The benefits, risks, alternatives and complications were also discussed. The patient understands and wishes to proceed with the procedure. Written consent was obtained.  Ultrasound was performed to localize and mark an adequate pocket of fluid in the RLQ quadrant of the abdomen. The area was then prepped and draped in the normal sterile fashion. 1% Lidocaine was used for local anesthesia. Under ultrasound guidance a 19 gauge Yueh catheter was introduced. Paracentesis was performed. The catheter was removed and a dressing applied.  Complications: none  FINDINGS: A total of approximately 3.2 liters of amber fluid was removed. A fluid sample was not sent for laboratory analysis.  IMPRESSION: Successful ultrasound guided paracentesis yielding 3.2 liters of ascites.  Read by: Beckey Downing PA-C   Electronically Signed   By: Richarda Overlie M.D.   On: 07/04/2013 13:45    EKG Interpretation   None       MDM   1. 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 Ascites    The patient will get therapeutic paracentesis and followup with oncologist this afternoon    Donnetta Hutching, MD 07/04/13 1439

## 2013-07-04 NOTE — Patient Instructions (Signed)
Decrease decadron to 4 mg daily

## 2013-07-04 NOTE — Progress Notes (Addendum)
OFFICE PROGRESS NOTE  Interval history:  George [name] is a 67 year old man with metastatic kidney cancer. He began Sutent 50 mg daily 4 weeks on/2 weeks off beginning 06/09/2013. He has been seen in the emergency Department on 3 occasions since his last visit here 06/24/2013.  The first emergency room visit occurred on 06/24/2013 for evaluation of abdominal pain and bloating. CT abdomen/pelvis showed new moderate volume of ascites, infiltration of the omentum was several left omental implants are measuring around 2 cm, a new 3.5 x 5.8 cm mass along the greater curvature of the stomach, persistent but smaller right renal mass, similar lung metastases. Paracentesis was performed with 4.2 L of fluid removed.  The second emergency room visit on 06/30/2013 was due to abdominal pain. An ultrasound guided paracentesis was performed on 07/01/2013 with 5.3 L of fluid removed.  He presented to the emergency Department earlier today with increased abdominal distention. Paracentesis was again performed with 3.2 L of fluid removed.  Following each paracentesis procedure he has noted improvement in his symptoms. The distention and pain tend to recur over 2-3 days. He is taking Vicodin for pain with partial relief. He feels he needs something stronger. He feels weak. His wife notes that he sleeps most of the time. He requires assistance with ambulation. He denies any falls. He reports a good appetite. He denies nausea/vomiting. No diarrhea. No skin rash. He is intermittently short of breath. He denies fever.   Objective: Blood pressure 139/71, pulse 70, temperature 98.8 F (37.1 C), temperature source Oral, resp. rate 20, height 6\' 2"  (1.88 m), weight 238 lb 3.2 oz (108.047 kg).  He was examined in the wheelchair as he did not feel he could safely maneuver onto the exam table. Pupils equal round and reactive to light. Extraocular movements intact. Sclera anicteric. Oropharynx is without thrush or ulceration. No  palpable cervical or supraclavicular lymph nodes. Lungs are clear. Regular cardiac rhythm. Abdomen distended. Pitting leg edema. Leg strength overall is intact but he may have some slight weakness proximally. No skin rash. He is alert, overall oriented though he did make a few inappropriate comments.  Lab Results: Lab Results  Component Value Date   WBC 4.6 06/30/2013   HGB 11.1* 06/30/2013   HCT 35.1* 06/30/2013   MCV 90.2 06/30/2013   PLT 223 06/30/2013    Chemistry:    Chemistry      Component Value Date/Time   NA 139 06/30/2013 1540   NA 139 06/21/2013 1551   K 4.3 06/30/2013 1540   K 4.6 06/21/2013 1551   CL 106 06/30/2013 1540   CO2 24 06/30/2013 1540   CO2 23 06/21/2013 1551   BUN 22 06/30/2013 1540   BUN 23.5 06/21/2013 1551   CREATININE 0.76 06/30/2013 1540   CREATININE 0.8 06/21/2013 1551      Component Value Date/Time   CALCIUM 8.1* 06/30/2013 1540   CALCIUM 8.6 06/21/2013 1551   ALKPHOS 61 06/30/2013 1540   ALKPHOS 73 06/21/2013 1551   AST 19 06/30/2013 1540   AST 8 06/21/2013 1551   ALT 20 06/30/2013 1540   ALT 16 06/21/2013 1551   BILITOT 0.2* 06/30/2013 1540   BILITOT 0.35 06/21/2013 1551       Studies/Results: Ct Abdomen Pelvis W Contrast  06/24/2013   CLINICAL DATA:  History of metastatic renal cell carcinoma, complaining of abdominal pain and bloating over the past several days, difficulty urinating, right flank pain  EXAM: CT ABDOMEN AND PELVIS WITH CONTRAST  TECHNIQUE:  Multidetector CT imaging of the abdomen and pelvis was performed using the standard protocol following bolus administration of intravenous contrast.  CONTRAST:  50mL OMNIPAQUE IOHEXOL 300 MG/ML SOLN, OMNIPAQUE IOHEXOL 300 MG/ML SOLN  COMPARISON:  03/31/2013  FINDINGS: There is a moderate volume of ascites, which was not present previously. There is infiltration of the omentum, with several left omental implants all measuring around 2 cm. There is a 3.5 x 5.8cm mass along the  greater curvature of the stomach that was not previously present. There is again a complex right renal mass, previously measuring about 14 x 12 cm and now all measuring about 11 by 8 cm. There is again an enhancing solid nodule in the anterior right perinephric space at the upper pole of the right kidney level, measuring about 3 cm. This was present previously. It may represent a satellite malignancy or adenopathy. There are numerous small and borderline periaortic and aortocaval nodes similar to prior study.  Liver shows no focal abnormalities, nor does the gallbladder. The spleen and pancreas show no acute findings. The adrenal glands and left kidney show no acute abnormalities. There is calcification of the nondilated aorta. There is a nonobstructive bowel gas pattern. The bladder is decompressed by a Foley catheter. Bone windows show a sclerotic lesion measuring less than 1 cm in the right ischium, stable. 4 mm sclerotic lesion left iliac bone is stable. There are no acute musculoskeletal findings.  Scans through the lung bases demonstrate multiple pulmonary nodules. These are similar to what was seen previously, with a new 1 cm nodule at the left lung base. There are small bilateral pleural effusions.  IMPRESSION: 1. Interval development of ascites, omental implants, and left gastric mass.  2.  Persistent but smaller right renal mass  3.  Lung metastases similar to prior study.   Electronically Signed   By: Esperanza Heir M.D.   On: 06/24/2013 18:04   US Paracentesis  07/04/2013   CLINICAL DATA:  Renal cancer  EXAM: ULTRASOUND GUIDED  RLQ PARACENTESIS  COMPARISON:  Previous paracentesis  PROCEDURE: An ultrasound guided paracentesis was thoroughly discussed with the patient and questions answered. The benefits, risks, alternatives and complications were also discussed. The patient understands and wishes to proceed with the procedure. Written consent was obtained.  Ultrasound was performed to localize and  mark an adequate pocket of fluid in the RLQ quadrant of the abdomen. The area was then prepped and draped in the normal sterile fashion. 1% Lidocaine was used for local anesthesia. Under ultrasound guidance a 19 gauge Yueh catheter was introduced. Paracentesis was performed. The catheter was removed and a dressing applied.  Complications: none  FINDINGS: A total of approximately 3.2 liters of amber fluid was removed. A fluid sample was not sent for laboratory analysis.  IMPRESSION: Successful ultrasound guided paracentesis yielding 3.2 liters of ascites.  Read by: Beckey Downing PA-C   Electronically Signed   By: Richarda Overlie M.D.   On: 07/04/2013 13:45   US Paracentesis  07/04/2013   CLINICAL DATA:  Metastatic renal cell carcinoma, abdominal distention, recurrent ascites. Request is made for therapeutic paracentesis.  EXAM: ULTRASOUND GUIDED THERAPEUTIC  PARACENTESIS  COMPARISON:  PARACENTESIS:  06/25/2013.  PROCEDURE: An ultrasound guided paracentesis was thoroughly discussed with the patient and questions answered. The benefits, risks, alternatives and complications were also discussed. The patient understands and wishes to proceed with the procedure. Written consent was obtained.  Ultrasound was performed to localize and mark an adequate pocket of fluid in  the right mid to lower. quadrant of the abdomen. The area was then prepped and draped in the normal sterile fashion. 1% Lidocaine was used for local anesthesia. Under ultrasound guidance a 19 gauge Yueh catheter was introduced. Paracentesis was performed. The catheter was removed and a dressing applied.  COMPLICATIONS: None immediate  FINDINGS: A total of approximately 5.3 liters. of yellow. fluid was removed.  IMPRESSION: Successful ultrasound guided therapeutic paracentesis yielding 5.3 liters of ascites.  Read by: Jeananne Rama ,P.A.-C.   Electronically Signed   By: Ruel Favors M.D.   On: 07/01/2013 15:22   US Paracentesis  06/25/2013   CLINICAL DATA:   Metastatic kidney cancer, abdominal pain and distention, ascites. Request is made for therapeutic paracentesis.  EXAM: ULTRASOUND GUIDED THERAPEUTIC PARACENTESIS  COMPARISON:  None.  PROCEDURE: An ultrasound guided paracentesis was thoroughly discussed with the patient and questions answered. The benefits, risks, alternatives and complications were also discussed. The patient understands and wishes to proceed with the procedure. Written consent was obtained.  Ultrasound was performed to localize and mark an adequate pocket of fluid in the right lower. quadrant of the abdomen. The area was then prepped and draped in the normal sterile fashion. 1% Lidocaine was used for local anesthesia. Under ultrasound guidance a 19 gauge Yueh catheter was introduced. Paracentesis was performed. The catheter was removed and a dressing applied.  COMPLICATIONS: None immediate  FINDINGS: A total of approximately 4.2 liters of slightly turbid, amber fluid was removed.  IMPRESSION: Successful ultrasound guided therapeutic paracentesis yielding 4.2 liters of ascites.  Read by: Jeananne Rama ,P.A.-C.   Electronically Signed   By: Irish Lack M.D.   On: 06/25/2013 11:52    Medications: I have reviewed the patient's current medications.  Assessment/Plan:  1. Widely metastatic kidney cancer. He began a trial of Sutent 50 mg 4 weeks on/2 weeks off beginning 06/09/2013. 2. Refractory ascites, likely malignant, requiring frequent paracentesis procedures.  3. 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 4. Seizure disorder secondary to #2 currently on Decadron 4 mg twice daily and Keppra. 5. Insulin-dependent diabetes. 6. Coronary artery disease status post coronary stent March 2003. 7. History of carcinoma in situ of the penis.  Disposition-George [name] has had multiple emergency room visits for paracentesis procedures. We proposed scheduling paracentesis procedures on a Monday/Thursday  schedule for now for his comfort. He is in agreement. We will request fluid be sent for cytology with the next paracentesis to confirm the ascites is malignant.  We adjusted his pain medication regimen and gave him a new prescription for oxycodone 10 mg one half to one tablet every 4 hours as needed.  He is completing the first cycle of Sutent. He will begin cycle 2 on 07/21/2013. He appears to be tolerating the Sutent well overall.  He will return for a followup visit with Dr. Cyndie Chime on 08/05/2013.   Patient seen with Dr. Cyndie Chime.  Lonna Cobb ANP/GNP-BC   Hematology oncology attending: History, physical, assessment and plan, accurate as recorded above by nurse practitioner. I personally interviewed and examined this patient and discussed management with him and his wife. He has widely metastatic kidney cancer. He presented with seizures secondary to brain metastases. He completed cranial radiation. He was just started on a trial of Sutent and just completed the first 4 week course. He has developed increasing abdominal swelling and discomfort and is now found to have peritoneal disease and mesenteric involvement with associated ascites. He has had 3 paracentesis  procedures since November 21. It is too early to call him a treatment failure on the Sutent. I am recommending standing orders for paracentesis twice weekly with the assistance of interventional radiology and proceeding with a second course of Sutent after a scheduled 2 week break. Reevaluation with scans at that time.   Cephas Darby, MD, FACP  Hematology-Oncology

## 2013-07-04 NOTE — ED Notes (Signed)
Pt and wife notified pts appt changed to  3:45pm with Dr. Reece Agar

## 2013-07-04 NOTE — ED Notes (Signed)
Patient presents with abdominal pain and distention. Patient states he has to have his abdominal fluid drained occasionally and he is here for the same. Patient has a history of kidney cancer.

## 2013-07-04 NOTE — Procedures (Signed)
US guided RLQ para  3.2 liters amber fluid obtained Pt did well BP stable

## 2013-07-05 ENCOUNTER — Other Ambulatory Visit: Payer: Self-pay | Admitting: Nurse Practitioner

## 2013-07-05 ENCOUNTER — Telehealth: Payer: Self-pay | Admitting: Oncology

## 2013-07-05 DIAGNOSIS — C649 Malignant neoplasm of unspecified kidney, except renal pelvis: Secondary | ICD-10-CM

## 2013-07-05 NOTE — Telephone Encounter (Signed)
LVM FOR PT REGARDING TO ALL APPT ON mONDAY AND THURSDAY.

## 2013-07-06 ENCOUNTER — Other Ambulatory Visit: Payer: Medicare Other

## 2013-07-06 ENCOUNTER — Encounter: Payer: Self-pay | Admitting: Nurse Practitioner

## 2013-07-06 DIAGNOSIS — R180 Malignant ascites: Secondary | ICD-10-CM

## 2013-07-06 HISTORY — DX: Malignant ascites: R18.0

## 2013-07-07 ENCOUNTER — Ambulatory Visit (HOSPITAL_COMMUNITY): Payer: Medicare Other

## 2013-07-07 ENCOUNTER — Ambulatory Visit
Admission: RE | Admit: 2013-07-07 | Discharge: 2013-07-07 | Disposition: A | Discharge status: Home / Self Care | Payer: Medicare Other | Source: Ambulatory Visit | Attending: Radiation Oncology | Admitting: Radiation Oncology

## 2013-07-07 ENCOUNTER — Ambulatory Visit (HOSPITAL_COMMUNITY)
Admission: RE | Admit: 2013-07-07 | Discharge: 2013-07-07 | Disposition: A | Discharge status: Home / Self Care | Payer: Medicare Other | Source: Ambulatory Visit | Attending: Nurse Practitioner | Admitting: Nurse Practitioner

## 2013-07-07 VITALS — BP 129/83

## 2013-07-07 DIAGNOSIS — R109 Unspecified abdominal pain: Secondary | ICD-10-CM | POA: Insufficient documentation

## 2013-07-07 DIAGNOSIS — C7931 Secondary malignant neoplasm of brain: Secondary | ICD-10-CM

## 2013-07-07 DIAGNOSIS — C649 Malignant neoplasm of unspecified kidney, except renal pelvis: Secondary | ICD-10-CM

## 2013-07-07 DIAGNOSIS — R180 Malignant ascites: Secondary | ICD-10-CM | POA: Insufficient documentation

## 2013-07-07 MED ORDER — GADOBENATE DIMEGLUMINE 529 MG/ML IV SOLN
20.0000 mL | Freq: Once | INTRAVENOUS | Status: AC | PRN
  Administered 2013-07-07: 20 mL via INTRAVENOUS

## 2013-07-07 NOTE — Procedures (Signed)
US guided diagnostic/therapeutic paracentesis performed yielding 4.3 liters slightly turbid, yellow fluid. A portion of the fluid was sent to the lab for cytology . No immediate complications.

## 2013-07-08 ENCOUNTER — Telehealth: Payer: Self-pay | Admitting: *Deleted

## 2013-07-08 ENCOUNTER — Telehealth: Payer: Self-pay | Admitting: Nurse Practitioner

## 2013-07-08 NOTE — Telephone Encounter (Signed)
Pt's wife called reporting that "Esiquio's pain is really bad and nothing is helping; we need help please"  She states that he switched from Vicodin 2 tabs every 6 hours to Oxycodone 1 tablet every 4 hours and that its still not controlling his pain.  Pt had paracentesis yesterday 07/07/13 and got 4.3 Liters off abdomin.  "Ankles/feet are swollen and he doesn't like to sit up because it's painful; only gets up to go to the bathroom and that's difficult."  Pt's wife also reports that his appetite is decreasing; they do not have any help @ home and "it's getting more difficult to transport him"  Pt took last Sutent 07/07/13 and they plan on starting another cycle in 2 weeks. Let pt's wife know Dr. Cyndie Chime out of town until next week but will make Lonna Cobb, NP aware and will call back with information.  Note to Lonna Cobb, NP.

## 2013-07-08 NOTE — Telephone Encounter (Signed)
Per Lonna Cobb, NP/Dr. Truett Perna; notified pt's wife she can increase pt's oxycodone to 2 tabs every 3-4 hours for pt's abdominal pain and to see how many tablets he has to let office know re: re-fill needs.  Also instructed wife if unable to manage at home; pain continues to not be controlled this weekend--she needs to take pt to ED to be further evaluated.  Pt's wife verbalized understanding and voiced back "I can give him 2 tabs of oxycodone every 4 hours and if that doesn't help can give every 3 hours"  Pt expressed appreciation for call back.

## 2013-07-08 NOTE — Telephone Encounter (Signed)
I called George [name] to followup on her call from earlier today. At present George [name] has no pain. Last pain medication was this morning at which time he took oxycodone 10 mg. She will call back with further problems.

## 2013-07-09 ENCOUNTER — Encounter (HOSPITAL_COMMUNITY): Payer: Self-pay | Admitting: Emergency Medicine

## 2013-07-09 ENCOUNTER — Inpatient Hospital Stay (HOSPITAL_COMMUNITY)
Admission: EM | Admit: 2013-07-09 | Discharge: 2013-08-04 | DRG: 686 | Disposition: E | Payer: Medicare Other | Attending: Internal Medicine | Admitting: Internal Medicine

## 2013-07-09 DIAGNOSIS — E872 Acidosis, unspecified: Secondary | ICD-10-CM | POA: Diagnosis not present

## 2013-07-09 DIAGNOSIS — E7800 Pure hypercholesterolemia, unspecified: Secondary | ICD-10-CM | POA: Diagnosis present

## 2013-07-09 DIAGNOSIS — IMO0002 Reserved for concepts with insufficient information to code with codable children: Secondary | ICD-10-CM

## 2013-07-09 DIAGNOSIS — C7931 Secondary malignant neoplasm of brain: Secondary | ICD-10-CM

## 2013-07-09 DIAGNOSIS — Z7982 Long term (current) use of aspirin: Secondary | ICD-10-CM

## 2013-07-09 DIAGNOSIS — R531 Weakness: Secondary | ICD-10-CM

## 2013-07-09 DIAGNOSIS — Z9221 Personal history of antineoplastic chemotherapy: Secondary | ICD-10-CM

## 2013-07-09 DIAGNOSIS — E871 Hypo-osmolality and hyponatremia: Secondary | ICD-10-CM | POA: Diagnosis present

## 2013-07-09 DIAGNOSIS — C7800 Secondary malignant neoplasm of unspecified lung: Secondary | ICD-10-CM | POA: Diagnosis present

## 2013-07-09 DIAGNOSIS — N2889 Other specified disorders of kidney and ureter: Secondary | ICD-10-CM

## 2013-07-09 DIAGNOSIS — Z6829 Body mass index (BMI) 29.0-29.9, adult: Secondary | ICD-10-CM

## 2013-07-09 DIAGNOSIS — G936 Cerebral edema: Secondary | ICD-10-CM | POA: Diagnosis present

## 2013-07-09 DIAGNOSIS — R180 Malignant ascites: Secondary | ICD-10-CM | POA: Diagnosis present

## 2013-07-09 DIAGNOSIS — G9340 Encephalopathy, unspecified: Secondary | ICD-10-CM

## 2013-07-09 DIAGNOSIS — R188 Other ascites: Secondary | ICD-10-CM

## 2013-07-09 DIAGNOSIS — R7989 Other specified abnormal findings of blood chemistry: Secondary | ICD-10-CM | POA: Diagnosis not present

## 2013-07-09 DIAGNOSIS — E43 Unspecified severe protein-calorie malnutrition: Secondary | ICD-10-CM | POA: Diagnosis present

## 2013-07-09 DIAGNOSIS — E860 Dehydration: Secondary | ICD-10-CM

## 2013-07-09 DIAGNOSIS — E162 Hypoglycemia, unspecified: Secondary | ICD-10-CM

## 2013-07-09 DIAGNOSIS — C649 Malignant neoplasm of unspecified kidney, except renal pelvis: Principal | ICD-10-CM | POA: Diagnosis present

## 2013-07-09 DIAGNOSIS — Z794 Long term (current) use of insulin: Secondary | ICD-10-CM

## 2013-07-09 DIAGNOSIS — I739 Peripheral vascular disease, unspecified: Secondary | ICD-10-CM | POA: Diagnosis present

## 2013-07-09 DIAGNOSIS — N170 Acute kidney failure with tubular necrosis: Secondary | ICD-10-CM | POA: Diagnosis not present

## 2013-07-09 DIAGNOSIS — Z66 Do not resuscitate: Secondary | ICD-10-CM | POA: Diagnosis present

## 2013-07-09 DIAGNOSIS — R569 Unspecified convulsions: Secondary | ICD-10-CM | POA: Diagnosis present

## 2013-07-09 DIAGNOSIS — R5381 Other malaise: Secondary | ICD-10-CM | POA: Diagnosis present

## 2013-07-09 DIAGNOSIS — I959 Hypotension, unspecified: Secondary | ICD-10-CM | POA: Diagnosis not present

## 2013-07-09 DIAGNOSIS — G40909 Epilepsy, unspecified, not intractable, without status epilepticus: Secondary | ICD-10-CM | POA: Diagnosis present

## 2013-07-09 DIAGNOSIS — Z808 Family history of malignant neoplasm of other organs or systems: Secondary | ICD-10-CM

## 2013-07-09 DIAGNOSIS — E119 Type 2 diabetes mellitus without complications: Secondary | ICD-10-CM | POA: Diagnosis present

## 2013-07-09 DIAGNOSIS — R109 Unspecified abdominal pain: Secondary | ICD-10-CM

## 2013-07-09 DIAGNOSIS — E785 Hyperlipidemia, unspecified: Secondary | ICD-10-CM | POA: Diagnosis present

## 2013-07-09 DIAGNOSIS — I2510 Atherosclerotic heart disease of native coronary artery without angina pectoris: Secondary | ICD-10-CM | POA: Diagnosis present

## 2013-07-09 DIAGNOSIS — N179 Acute kidney failure, unspecified: Secondary | ICD-10-CM

## 2013-07-09 DIAGNOSIS — Z87891 Personal history of nicotine dependence: Secondary | ICD-10-CM

## 2013-07-09 DIAGNOSIS — Z8051 Family history of malignant neoplasm of kidney: Secondary | ICD-10-CM

## 2013-07-09 DIAGNOSIS — E46 Unspecified protein-calorie malnutrition: Secondary | ICD-10-CM | POA: Diagnosis present

## 2013-07-09 DIAGNOSIS — R609 Edema, unspecified: Secondary | ICD-10-CM | POA: Diagnosis present

## 2013-07-09 DIAGNOSIS — K5900 Constipation, unspecified: Secondary | ICD-10-CM | POA: Diagnosis not present

## 2013-07-09 DIAGNOSIS — I10 Essential (primary) hypertension: Secondary | ICD-10-CM | POA: Diagnosis present

## 2013-07-09 DIAGNOSIS — R627 Adult failure to thrive: Secondary | ICD-10-CM | POA: Diagnosis present

## 2013-07-09 DIAGNOSIS — E1169 Type 2 diabetes mellitus with other specified complication: Secondary | ICD-10-CM | POA: Diagnosis present

## 2013-07-09 DIAGNOSIS — E875 Hyperkalemia: Secondary | ICD-10-CM

## 2013-07-09 DIAGNOSIS — Z79899 Other long term (current) drug therapy: Secondary | ICD-10-CM

## 2013-07-09 DIAGNOSIS — Z515 Encounter for palliative care: Secondary | ICD-10-CM

## 2013-07-09 DIAGNOSIS — R4182 Altered mental status, unspecified: Secondary | ICD-10-CM

## 2013-07-09 LAB — CBC WITH DIFFERENTIAL/PLATELET
Basophils Absolute: 0 10*3/uL (ref 0.0–0.1)
Basophils Relative: 0 % (ref 0–1)
Eosinophils Absolute: 0 10*3/uL (ref 0.0–0.7)
Eosinophils Relative: 0 % (ref 0–5)
HCT: 34.6 % — ABNORMAL LOW (ref 39.0–52.0)
Hemoglobin: 11.1 g/dL — ABNORMAL LOW (ref 13.0–17.0)
Lymphs Abs: 0.4 10*3/uL — ABNORMAL LOW (ref 0.7–4.0)
MCH: 28.2 pg (ref 26.0–34.0)
MCV: 87.8 fL (ref 78.0–100.0)
Monocytes Absolute: 0.1 10*3/uL (ref 0.1–1.0)
Monocytes Relative: 4 % (ref 3–12)
Neutro Abs: 3 10*3/uL (ref 1.7–7.7)
Neutrophils Relative %: 85 % — ABNORMAL HIGH (ref 43–77)
RBC: 3.94 MIL/uL — ABNORMAL LOW (ref 4.22–5.81)
RDW: 20.9 % — ABNORMAL HIGH (ref 11.5–15.5)

## 2013-07-09 LAB — COMPREHENSIVE METABOLIC PANEL
Albumin: 1.8 g/dL — ABNORMAL LOW (ref 3.5–5.2)
Alkaline Phosphatase: 57 U/L (ref 39–117)
BUN: 29 mg/dL — ABNORMAL HIGH (ref 6–23)
Creatinine, Ser: 1.01 mg/dL (ref 0.50–1.35)
GFR calc Af Amer: 87 mL/min — ABNORMAL LOW (ref >90)
Glucose, Bld: 39 mg/dL — CL (ref 70–99)
Potassium: 5 mEq/L (ref 3.5–5.1)
Total Protein: 4.4 g/dL — ABNORMAL LOW (ref 6.0–8.3)

## 2013-07-09 LAB — GLUCOSE, CAPILLARY: Glucose-Capillary: 140 mg/dL — ABNORMAL HIGH (ref 70–99)

## 2013-07-09 LAB — LIPASE, BLOOD: Lipase: 7 U/L — ABNORMAL LOW (ref 11–59)

## 2013-07-09 MED ORDER — SODIUM CHLORIDE 0.9 % IV SOLN: INTRAVENOUS | Status: DC

## 2013-07-09 MED ORDER — ONDANSETRON HCL 4 MG/2ML IJ SOLN: 4.0000 mg | Freq: Three times a day (TID) | INTRAMUSCULAR | Status: DC | PRN

## 2013-07-09 MED ORDER — DEXTROSE 50 % IV SOLN
1.0000 | Freq: Once | INTRAVENOUS | Status: AC
  Administered 2013-07-09: 50 mL via INTRAVENOUS
  Filled 2013-07-09: qty 50

## 2013-07-09 MED ORDER — SODIUM CHLORIDE 0.9 % IV BOLUS (SEPSIS)
1000.0000 mL | Freq: Once | INTRAVENOUS | Status: AC
  Administered 2013-07-09: 1000 mL via INTRAVENOUS

## 2013-07-09 MED ORDER — FENTANYL CITRATE 0.05 MG/ML IJ SOLN
50.0000 ug | Freq: Once | INTRAMUSCULAR | Status: AC
Start: 2013-07-09 — End: 2013-07-09
  Administered 2013-07-09: 50 ug via INTRAVENOUS
  Filled 2013-07-09: qty 2

## 2013-07-09 MED ORDER — HYDROMORPHONE HCL PF 1 MG/ML IJ SOLN
1.0000 mg | Freq: Once | INTRAMUSCULAR | Status: AC
  Administered 2013-07-09: 1 mg via INTRAVENOUS
  Filled 2013-07-09: qty 1

## 2013-07-09 MED ORDER — HYDROMORPHONE HCL PF 1 MG/ML IJ SOLN
1.0000 mg | INTRAMUSCULAR | Status: DC | PRN
  Filled 2013-07-09: qty 1

## 2013-07-09 MED ORDER — DEXTROSE 50 % IV SOLN
1.0000 | Freq: Once | INTRAVENOUS | Status: AC
  Administered 2013-07-10: 50 mL via INTRAVENOUS

## 2013-07-09 MED ORDER — ONDANSETRON HCL 4 MG/2ML IJ SOLN
4.0000 mg | Freq: Once | INTRAMUSCULAR | Status: AC
  Administered 2013-07-09: 4 mg via INTRAVENOUS
  Filled 2013-07-09: qty 2

## 2013-07-09 NOTE — ED Notes (Signed)
Pt given orange juice.  Pt more alert and answering questions.

## 2013-07-09 NOTE — ED Notes (Signed)
Pt tolerating orange juice 

## 2013-07-09 NOTE — ED Notes (Signed)
notifed nurse of low blood sugar

## 2013-07-09 NOTE — ED Provider Notes (Signed)
CSN: [id number]     Arrival date & time 19-Jul-2013  2042 History   First MD Initiated Contact with Patient 2013-07-19 2152     Chief Complaint  Patient presents with  . Abdominal Pain   (Consider location/radiation/quality/duration/timing/severity/associated sxs/prior Treatment) Patient is a 67 y.o. male presenting with abdominal pain. The history is provided by the patient and the spouse. The history is limited by the condition of the patient.  Abdominal Pain Associated symptoms: no chest pain, no chills, no cough, no fever, no shortness of breath, no sore throat and no vomiting   pt with hx metastatic renal cell ca, to abd, brain, lung, presents c/o persistent, constant, diffuse abd pain, nausea, and poor po intake. Spouse states is gradually becoming weaker, today couldn't get out of bed. No fall. Has been getting periodic therapeutic paracentesis in past couple weeks, last a couple days ago. Pt notes transient mild improvement w procedure. Denies fever or chills. Is eating very little. Had small bm today. No vomiting. No dysuria or gu c/o, making urine. States had called his heme/onc doctor and was advised to take more frequent pain medication, but states not helping. As of yet, no home health or palliative care referral.         Past Medical History  Diagnosis Date  . Coronary artery disease   . Diabetes mellitus without complication   . Hyperlipidemia   . Cancer     kidney, mets to brain and lung  . 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 Hx of radiation therapy 04/07/13-04/20/13    whole brain/r side renal tumor   . Hypercholesteremia   . Coronary atherosclerosis of native coronary artery   . Osteoarthritis   . HTN (hypertension)   . Seizure   . Malignant ascites 07/06/2013   Past Surgical History  Procedure Laterality Date  . Cardiac catheterization  03/03    2 stents  . Biopsy of kidney Right 04/05/2013  .  Knee surgery Left   . Hemorroidectomy     Family History  Problem Relation Age of Onset  . Skin cancer Father   . Thyroid cancer Brother   . Kidney cancer Maternal Uncle    History  Substance Use Topics  . Smoking status: Former Smoker -- 0.50 packs/day    Types: Cigarettes    Quit date: 03/28/2013  . Smokeless tobacco: Not on file  . Alcohol Use: No    Review of Systems  Constitutional: Negative for fever and chills.  HENT: Negative for sore throat.   Eyes: Negative for redness.  Respiratory: Negative for cough and shortness of breath.   Cardiovascular: Negative for chest pain.  Gastrointestinal: Positive for abdominal pain. Negative for vomiting.  Genitourinary: Negative for flank pain.  Musculoskeletal: Negative for back pain and neck pain.  Skin: Negative for rash.  Neurological: Negative for headaches.  Hematological: Does not bruise/bleed easily.  Psychiatric/Behavioral: Negative for agitation.    Allergies  Lipitor and Metformin and related  Home Medications   Current Outpatient Rx  Name  Route  Sig  Dispense  Refill  . ACCU-CHEK AVIVA PLUS test strip               . aspirin 325 MG EC tablet   Oral   Take 1 tablet (325 mg total) by mouth daily.         Marland Kitchen dexamethasone (DECADRON) 4 MG tablet   Oral   Take  1 tablet (4 mg total) by mouth 2 (two) times daily.   60 tablet   4   . fish oil-omega-3 fatty acids 1000 MG capsule   Oral   Take 1 g by mouth daily.         . insulin aspart (NOVOLOG) 100 UNIT/ML injection      Before each meal 3 times a day, 140-199 - 2 units, 200-250 - 4 units, 251-299 - 6 units,  300-349 - 8 units,  350 or above 10 units. Insulin PEN if approved, provide syringes and needles if needed.   1 vial   12   . insulin glargine (LANTUS) 100 UNIT/ML injection   Subcutaneous   Inject 72 Units into the skin at bedtime.          . levETIRAcetam (KEPPRA) 1000 MG tablet   Oral   Take 1 tablet (1,000 mg total) by mouth 2  (two) times daily.   60 tablet   4   . Oxycodone HCl 10 MG TABS   Oral   Take 0.5-1 tablets (5-10 mg total) by mouth every 4 (four) hours as needed.   100 tablet   0   . pantoprazole (PROTONIX) 40 MG tablet      TAKE 1 TABLET (40 MG TOTAL) BY MOUTH DAILY AT 12 NOON.   30 tablet   3     No refills available   . psyllium (REGULOID) 0.52 G capsule   Oral   Take 0.52 g by mouth 2 (two) times daily.          . simvastatin (ZOCOR) 80 MG tablet   Oral   Take 80 mg by mouth daily.          . SUNItinib (SUTENT) 50 MG capsule   Oral   Take 50 mg by mouth See admin instructions. Starting on 06/09/13, takes daily x 4 weeks, off for 2 weeks, then repeat.          BP 106/61  Pulse 93  Temp(Src) 98 F (36.7 C) (Oral)  Resp 18  SpO2 98% Physical Exam  Nursing note and vitals reviewed. Constitutional: He is oriented to person, place, and time. He appears well-developed and well-nourished. No distress.  HENT:  Head: Atraumatic.  Eyes: Conjunctivae are normal. Pupils are equal, round, and reactive to light. No scleral icterus.  Neck: Neck supple. No tracheal deviation present.  Cardiovascular: Normal rate, regular rhythm, normal heart sounds and intact distal pulses.   Pulmonary/Chest: Effort normal and breath sounds normal. No accessory muscle usage. No respiratory distress.  Abdominal: Soft. Bowel sounds are normal. He exhibits no mass. There is no rebound and no guarding.  Ascites. No focal abd tenderness.   Genitourinary:  No cva tenderness  Musculoskeletal: Normal range of motion. He exhibits no tenderness.  Neurological: He is alert and oriented to person, place, and time.  Skin: Skin is warm and dry.  Psychiatric:  Slow to respond. Flat affect.     ED Course  Procedures (including critical care time)  Results for orders placed during the hospital encounter of 07/25/2013  CBC WITH DIFFERENTIAL      Result Value Range   WBC 3.5 (*) 4.0 - 10.5 K/uL   RBC 3.94 (*)  4.22 - 5.81 MIL/uL   Hemoglobin 11.1 (*) 13.0 - 17.0 g/dL   HCT 47.8 (*) 29.5 - 62.1 %   MCV 87.8  78.0 - 100.0 fL   MCH 28.2  26.0 - 34.0 pg  MCHC 32.1  30.0 - 36.0 g/dL   RDW 16.1 (*) 09.6 - 04.5 %   Platelets 168  150 - 400 K/uL   Neutrophils Relative % 85 (*) 43 - 77 %   Neutro Abs 3.0  1.7 - 7.7 K/uL   Lymphocytes Relative 11 (*) 12 - 46 %   Lymphs Abs 0.4 (*) 0.7 - 4.0 K/uL   Monocytes Relative 4  3 - 12 %   Monocytes Absolute 0.1  0.1 - 1.0 K/uL   Eosinophils Relative 0  0 - 5 %   Eosinophils Absolute 0.0  0.0 - 0.7 K/uL   Basophils Relative 0  0 - 1 %   Basophils Absolute 0.0  0.0 - 0.1 K/uL  COMPREHENSIVE METABOLIC PANEL      Result Value Range   Sodium 130 (*) 135 - 145 mEq/L   Potassium 5.0  3.5 - 5.1 mEq/L   Chloride 100  96 - 112 mEq/L   CO2 21  19 - 32 mEq/L   Glucose, Bld 39 (*) 70 - 99 mg/dL   BUN 29 (*) 6 - 23 mg/dL   Creatinine, Ser 4.09  0.50 - 1.35 mg/dL   Calcium 7.7 (*) 8.4 - 10.5 mg/dL   Total Protein 4.4 (*) 6.0 - 8.3 g/dL   Albumin 1.8 (*) 3.5 - 5.2 g/dL   AST 19  0 - 37 U/L   ALT 15  0 - 53 U/L   Alkaline Phosphatase 57  39 - 117 U/L   Total Bilirubin 0.2 (*) 0.3 - 1.2 mg/dL   GFR calc non Af Amer 75 (*) >90 mL/min   GFR calc Af Amer 87 (*) >90 mL/min  LIPASE, BLOOD      Result Value Range   Lipase 7 (*) 11 - 59 U/L  GLUCOSE, CAPILLARY      Result Value Range   Glucose-Capillary 34 (*) 70 - 99 mg/dL   Comment 1 Documented in Chart     Comment 2 Notify RN    GLUCOSE, CAPILLARY      Result Value Range   Glucose-Capillary 140 (*) 70 - 99 mg/dL   Mr Laqueta Jean Wo Contrast  07/07/2013   CLINICAL DATA:  Renal cell carcinoma with lung and brain metastases. Status post SRS.  EXAM: MRI HEAD WITHOUT AND WITH CONTRAST  TECHNIQUE: Multiplanar, multiecho pulse sequences of the brain and surrounding structures were obtained without and with intravenous contrast.  CONTRAST:  20mL MULTIHANCE GADOBENATE DIMEGLUMINE 529 MG/ML IV SOLN  COMPARISON:  MRI brain  9/29/ 14.  FINDINGS: The hemorrhagic lesion in the anterior left frontal lobe demonstrates decreased size on postcontrast enhancement, now measuring 21 x 13 mm compared with 28 x 21 mm. A more posterior lesion within the shared area of edema has slightly increased in size, now measuring 9 x 19 mm compared with maximal previous measurements of 8 x 15 mm. A more lateral 4 mm lesion is slightly more prominent than on the prior exam.  Previously seen lesions within the right parietal lobe are no longer evident. The left cerebellar lesions are slightly decreased in size.  No new lesions are evident.  No acute infarct or hemorrhage is present. Remote blood products are stable. Moderate generalized atrophy and white matter disease is similar to the prior exam. There is a remote lacunar infarct involving the right coronal radiata. Extent of vasogenic edema in the left frontal lobe is slightly diminished. There is less effacement of the sulci.  Flow is  present in the major intracranial arteries. The globes and orbits are intact. The paranasal sinuses and mastoid air cells are clear.  IMPRESSION: 1. Decrease in size of the hemorrhagic lesion within the anterior left frontal lobe. 2. Slight increase in size of a more posterior and inferior lesion in the anterior left frontal lobe. 3. The edema surrounding both lesions has slightly decreased in the interval with reduced mass effect. 4. Decreased size and edema of lesions within the left cerebellum. 5. The previously seen right parietal lesions are no longer evident. 6. Extensive white matter disease is stable.   Electronically Signed   By: Gennette Pac M.D.   On: 07/07/2013 16:26   Ct Abdomen Pelvis W Contrast  06/24/2013   CLINICAL DATA:  History of metastatic renal cell carcinoma, complaining of abdominal pain and bloating over the past several days, difficulty urinating, right flank pain  EXAM: CT ABDOMEN AND PELVIS WITH CONTRAST  TECHNIQUE: Multidetector CT imaging of  the abdomen and pelvis was performed using the standard protocol following bolus administration of intravenous contrast.  CONTRAST:  50mL OMNIPAQUE IOHEXOL 300 MG/ML SOLN, OMNIPAQUE IOHEXOL 300 MG/ML SOLN  COMPARISON:  03/31/2013  FINDINGS: There is a moderate volume of ascites, which was not present previously. There is infiltration of the omentum, with several left omental implants all measuring around 2 cm. There is a 3.5 x 5.8cm mass along the greater curvature of the stomach that was not previously present. There is again a complex right renal mass, previously measuring about 14 x 12 cm and now all measuring about 11 by 8 cm. There is again an enhancing solid nodule in the anterior right perinephric space at the upper pole of the right kidney level, measuring about 3 cm. This was present previously. It may represent a satellite malignancy or adenopathy. There are numerous small and borderline periaortic and aortocaval nodes similar to prior study.  Liver shows no focal abnormalities, nor does the gallbladder. The spleen and pancreas show no acute findings. The adrenal glands and left kidney show no acute abnormalities. There is calcification of the nondilated aorta. There is a nonobstructive bowel gas pattern. The bladder is decompressed by a Foley catheter. Bone windows show a sclerotic lesion measuring less than 1 cm in the right ischium, stable. 4 mm sclerotic lesion left iliac bone is stable. There are no acute musculoskeletal findings.  Scans through the lung bases demonstrate multiple pulmonary nodules. These are similar to what was seen previously, with a new 1 cm nodule at the left lung base. There are small bilateral pleural effusions.  IMPRESSION: 1. Interval development of ascites, omental implants, and left gastric mass.  2.  Persistent but smaller right renal mass  3.  Lung metastases similar to prior study.   Electronically Signed   By: Esperanza Heir M.D.   On: 06/24/2013 18:04   US  Paracentesis  07/07/2013   CLINICAL DATA:  Metastatic renal cell carcinoma, abdominal pain and distention, recurrent ascites. Request is made for diagnostic and therapeutic paracentesis  EXAM: ULTRASOUND GUIDED DIAGNOSTIC AND THERAPEUTIC PARACENTESIS  COMPARISON:  PARACENTESIS ON 07/04/2013.  PROCEDURE: An ultrasound guided paracentesis was thoroughly discussed with the patient and questions answered. The benefits, risks, alternatives and complications were also discussed. The patient understands and wishes to proceed with the procedure. Written consent was obtained.  Ultrasound was performed to localize and mark an adequate pocket of fluid in the right lower quadrant of the abdomen. The area was then prepped and draped  in the normal sterile fashion. 1% Lidocaine was used for local anesthesia. Under ultrasound guidance a 19 gauge Yueh catheter was introduced. Paracentesis was performed. The catheter was removed and a dressing applied.  Complications:None immediate  FINDINGS: A total of approximately 4.3 liters of slightly turbid, yellow fluid was removed. A fluid sample was sent for cytology.  IMPRESSION: Successful ultrasound guided diagnostic and therapeutic paracentesis yielding 4.3 liters of ascites.  Read by: Jeananne Rama ,P.A.-C.   Electronically Signed   By: Simonne Come M.D.   On: 07/07/2013 14:54   US Paracentesis  07/04/2013   CLINICAL DATA:  Renal cancer  EXAM: ULTRASOUND GUIDED  RLQ PARACENTESIS  COMPARISON:  Previous paracentesis  PROCEDURE: An ultrasound guided paracentesis was thoroughly discussed with the patient and questions answered. The benefits, risks, alternatives and complications were also discussed. The patient understands and wishes to proceed with the procedure. Written consent was obtained.  Ultrasound was performed to localize and mark an adequate pocket of fluid in the RLQ quadrant of the abdomen. The area was then prepped and draped in the normal sterile fashion. 1% Lidocaine was  used for local anesthesia. Under ultrasound guidance a 19 gauge Yueh catheter was introduced. Paracentesis was performed. The catheter was removed and a dressing applied.  Complications: none  FINDINGS: A total of approximately 3.2 liters of amber fluid was removed. A fluid sample was not sent for laboratory analysis.  IMPRESSION: Successful ultrasound guided paracentesis yielding 3.2 liters of ascites.  Read by: Beckey Downing PA-C   Electronically Signed   By: Richarda Overlie M.D.   On: 07/04/2013 13:45   US Paracentesis  07/04/2013   CLINICAL DATA:  Metastatic renal cell carcinoma, abdominal distention, recurrent ascites. Request is made for therapeutic paracentesis.  EXAM: ULTRASOUND GUIDED THERAPEUTIC  PARACENTESIS  COMPARISON:  PARACENTESIS:  06/25/2013.  PROCEDURE: An ultrasound guided paracentesis was thoroughly discussed with the patient and questions answered. The benefits, risks, alternatives and complications were also discussed. The patient understands and wishes to proceed with the procedure. Written consent was obtained.  Ultrasound was performed to localize and mark an adequate pocket of fluid in the right mid to lower. quadrant of the abdomen. The area was then prepped and draped in the normal sterile fashion. 1% Lidocaine was used for local anesthesia. Under ultrasound guidance a 19 gauge Yueh catheter was introduced. Paracentesis was performed. The catheter was removed and a dressing applied.  COMPLICATIONS: None immediate  FINDINGS: A total of approximately 5.3 liters. of yellow. fluid was removed.  IMPRESSION: Successful ultrasound guided therapeutic paracentesis yielding 5.3 liters of ascites.  Read by: Jeananne Rama ,P.A.-C.   Electronically Signed   By: Ruel Favors M.D.   On: 07/01/2013 15:22   US Paracentesis  06/25/2013   CLINICAL DATA:  Metastatic kidney cancer, abdominal pain and distention, ascites. Request is made for therapeutic paracentesis.  EXAM: ULTRASOUND GUIDED THERAPEUTIC  PARACENTESIS  COMPARISON:  None.  PROCEDURE: An ultrasound guided paracentesis was thoroughly discussed with the patient and questions answered. The benefits, risks, alternatives and complications were also discussed. The patient understands and wishes to proceed with the procedure. Written consent was obtained.  Ultrasound was performed to localize and mark an adequate pocket of fluid in the right lower. quadrant of the abdomen. The area was then prepped and draped in the normal sterile fashion. 1% Lidocaine was used for local anesthesia. Under ultrasound guidance a 19 gauge Yueh catheter was introduced. Paracentesis was performed. The catheter was removed and a dressing  applied.  COMPLICATIONS: None immediate  FINDINGS: A total of approximately 4.2 liters of slightly turbid, amber fluid was removed.  IMPRESSION: Successful ultrasound guided therapeutic paracentesis yielding 4.2 liters of ascites.  Read by: Jeananne Rama ,P.A.-C.   Electronically Signed   By: Irish Lack M.D.   On: 06/25/2013 11:52     EKG Interpretation   None       MDM  Iv ns. Labs.   Dilaudid 1 mg iv. zofran iv.  Reviewed nursing notes and prior charts for additional history.   Blood sugar low (30's) - pt taking normal meds, v poor po intake today.  D50. Po fluids, will order meal.  Recheck blood sugar improved.   Na 130, lower than recent prior. Pt appears dehydrated, bun inc from prior - will give ivf.   Could benefit from repeat paracentis in am, and possibly palliative care consult.  Given weakness, inability to ambulate, pt/spouse to care for at home, hyponatremia, dehydration, hypoglycemia - med service called to admit.   Discussed w Dr Lovell Sheehan, who states temp orders, admit, med surg.        Suzi Roots, MD 07/05/2013 [phone]

## 2013-07-09 NOTE — ED Notes (Signed)
Pt aware of need for urine sample.  

## 2013-07-09 NOTE — ED Notes (Signed)
Pt to ER via EMS for complaints of abd pain; pt states that he has had "fluid build up" to abdomen and has to have it removed; pt states that he had the fluid removed on Mon and Thurs but is feeling pain tonight from the pressure

## 2013-07-09 NOTE — ED Notes (Signed)
Bed: WA17 Expected date:  Expected time:  Means of arrival:  Comments: EMS abd pain , [place] pt

## 2013-07-10 ENCOUNTER — Encounter (HOSPITAL_COMMUNITY): Payer: Self-pay

## 2013-07-10 ENCOUNTER — Inpatient Hospital Stay (HOSPITAL_COMMUNITY): Payer: Medicare Other

## 2013-07-10 DIAGNOSIS — E162 Hypoglycemia, unspecified: Secondary | ICD-10-CM | POA: Diagnosis present

## 2013-07-10 DIAGNOSIS — R627 Adult failure to thrive: Secondary | ICD-10-CM

## 2013-07-10 DIAGNOSIS — C801 Malignant (primary) neoplasm, unspecified: Secondary | ICD-10-CM

## 2013-07-10 DIAGNOSIS — E860 Dehydration: Secondary | ICD-10-CM

## 2013-07-10 DIAGNOSIS — I2510 Atherosclerotic heart disease of native coronary artery without angina pectoris: Secondary | ICD-10-CM

## 2013-07-10 DIAGNOSIS — R188 Other ascites: Secondary | ICD-10-CM

## 2013-07-10 DIAGNOSIS — C649 Malignant neoplasm of unspecified kidney, except renal pelvis: Principal | ICD-10-CM

## 2013-07-10 DIAGNOSIS — E871 Hypo-osmolality and hyponatremia: Secondary | ICD-10-CM

## 2013-07-10 DIAGNOSIS — R5381 Other malaise: Secondary | ICD-10-CM

## 2013-07-10 DIAGNOSIS — R531 Weakness: Secondary | ICD-10-CM | POA: Diagnosis present

## 2013-07-10 DIAGNOSIS — E119 Type 2 diabetes mellitus without complications: Secondary | ICD-10-CM

## 2013-07-10 DIAGNOSIS — E785 Hyperlipidemia, unspecified: Secondary | ICD-10-CM

## 2013-07-10 DIAGNOSIS — R109 Unspecified abdominal pain: Secondary | ICD-10-CM

## 2013-07-10 DIAGNOSIS — R569 Unspecified convulsions: Secondary | ICD-10-CM

## 2013-07-10 LAB — URINALYSIS, ROUTINE W REFLEX MICROSCOPIC
Hgb urine dipstick: NEGATIVE
Ketones, ur: NEGATIVE mg/dL
Nitrite: NEGATIVE
Protein, ur: NEGATIVE mg/dL
Specific Gravity, Urine: 1.029 (ref 1.005–1.030)
Urobilinogen, UA: 1 mg/dL (ref 0.0–1.0)

## 2013-07-10 LAB — GLUCOSE, CAPILLARY
Glucose-Capillary: 111 mg/dL — ABNORMAL HIGH (ref 70–99)
Glucose-Capillary: 131 mg/dL — ABNORMAL HIGH (ref 70–99)
Glucose-Capillary: 52 mg/dL — ABNORMAL LOW (ref 70–99)
Glucose-Capillary: 54 mg/dL — ABNORMAL LOW (ref 70–99)
Glucose-Capillary: 68 mg/dL — ABNORMAL LOW (ref 70–99)
Glucose-Capillary: 69 mg/dL — ABNORMAL LOW (ref 70–99)
Glucose-Capillary: 74 mg/dL (ref 70–99)
Glucose-Capillary: 85 mg/dL (ref 70–99)
Glucose-Capillary: 93 mg/dL (ref 70–99)

## 2013-07-10 MED ORDER — ALUM & MAG HYDROXIDE-SIMETH 200-200-20 MG/5ML PO SUSP: 30.0000 mL | Freq: Four times a day (QID) | ORAL | Status: DC | PRN

## 2013-07-10 MED ORDER — DEXTROSE 5 % IV SOLN: INTRAVENOUS | Status: DC

## 2013-07-10 MED ORDER — ZOLPIDEM TARTRATE 5 MG PO TABS: 5.0000 mg | ORAL_TABLET | Freq: Every evening | ORAL | Status: DC | PRN

## 2013-07-10 MED ORDER — GLUCAGON HCL (RDNA) 1 MG IJ SOLR: 1.0000 mg | Freq: Once | INTRAMUSCULAR | Status: AC | PRN

## 2013-07-10 MED ORDER — DEXTROSE 50 % IV SOLN
25.0000 mL | Freq: Once | INTRAVENOUS | Status: AC | PRN
  Administered 2013-07-10: 25 mL via INTRAVENOUS

## 2013-07-10 MED ORDER — HYDROMORPHONE HCL PF 1 MG/ML IJ SOLN
0.5000 mg | INTRAMUSCULAR | Status: DC | PRN
  Administered 2013-07-10 (×3): 1 mg via INTRAVENOUS
  Filled 2013-07-10 (×2): qty 1

## 2013-07-10 MED ORDER — ONDANSETRON HCL 4 MG PO TABS: 4.0000 mg | ORAL_TABLET | Freq: Four times a day (QID) | ORAL | Status: DC | PRN

## 2013-07-10 MED ORDER — DEXTROSE-NACL 5-0.9 % IV SOLN
INTRAVENOUS | Status: DC
  Administered 2013-07-10: 01:00:00 via INTRAVENOUS

## 2013-07-10 MED ORDER — DEXTROSE 5 % IV SOLN
INTRAVENOUS | Status: DC
  Administered 2013-07-10: 50 mL via INTRAVENOUS

## 2013-07-10 MED ORDER — GLUCOSE 40 % PO GEL
1.0000 | ORAL | Status: DC | PRN
  Administered 2013-07-10 – 2013-07-13 (×2): 37.5 g via ORAL
  Filled 2013-07-10 (×3): qty 1

## 2013-07-10 MED ORDER — OXYCODONE HCL 5 MG PO TABS
5.0000 mg | ORAL_TABLET | ORAL | Status: DC | PRN
  Administered 2013-07-10 (×2): 10 mg via ORAL
  Filled 2013-07-10 (×2): qty 2

## 2013-07-10 MED ORDER — HYDROMORPHONE HCL PF 1 MG/ML IJ SOLN
1.0000 mg | INTRAMUSCULAR | Status: DC | PRN
  Administered 2013-07-10 – 2013-07-14 (×19): 1 mg via INTRAVENOUS
  Filled 2013-07-10 (×20): qty 1

## 2013-07-10 MED ORDER — GLUCOSE 40 % PO GEL: 1.0000 | Freq: Once | ORAL | Status: AC | PRN

## 2013-07-10 MED ORDER — ONDANSETRON HCL 4 MG/2ML IJ SOLN
4.0000 mg | Freq: Four times a day (QID) | INTRAMUSCULAR | Status: DC | PRN
  Administered 2013-07-10: 4 mg via INTRAVENOUS
  Filled 2013-07-10: qty 2

## 2013-07-10 MED ORDER — DEXAMETHASONE 4 MG PO TABS
4.0000 mg | ORAL_TABLET | Freq: Two times a day (BID) | ORAL | Status: DC
Start: 2013-07-10 — End: 2013-07-10
  Administered 2013-07-10: 4 mg via ORAL
  Filled 2013-07-10 (×3): qty 1

## 2013-07-10 MED ORDER — GLUCAGON HCL (RDNA) 1 MG IJ SOLR: 0.5000 mg | Freq: Once | INTRAMUSCULAR | Status: AC | PRN

## 2013-07-10 MED ORDER — ATORVASTATIN CALCIUM 40 MG PO TABS
40.0000 mg | ORAL_TABLET | Freq: Every day | ORAL | Status: DC
  Administered 2013-07-12: 40 mg via ORAL
  Filled 2013-07-10 (×6): qty 1

## 2013-07-10 MED ORDER — OMEGA-3 FATTY ACIDS 1000 MG PO CAPS: 1.0000 g | ORAL_CAPSULE | Freq: Every day | ORAL | Status: DC

## 2013-07-10 MED ORDER — DEXTROSE 50 % IV SOLN: 50.0000 mL | Freq: Once | INTRAVENOUS | Status: AC | PRN

## 2013-07-10 MED ORDER — DEXTROSE 50 % IV SOLN
50.0000 mL | Freq: Once | INTRAVENOUS | Status: AC | PRN
  Filled 2013-07-10: qty 50

## 2013-07-10 MED ORDER — OMEGA-3-ACID ETHYL ESTERS 1 G PO CAPS
1.0000 g | ORAL_CAPSULE | Freq: Every day | ORAL | Status: DC
  Administered 2013-07-10 – 2013-07-11 (×2): 1 g via ORAL
  Filled 2013-07-10 (×6): qty 1

## 2013-07-10 MED ORDER — DEXAMETHASONE SODIUM PHOSPHATE 4 MG/ML IJ SOLN
4.0000 mg | Freq: Two times a day (BID) | INTRAMUSCULAR | Status: DC
  Administered 2013-07-10 – 2013-07-14 (×8): 4 mg via INTRAVENOUS
  Filled 2013-07-10 (×11): qty 1

## 2013-07-10 MED ORDER — INSULIN GLARGINE 100 UNIT/ML ~~LOC~~ SOLN
30.0000 [IU] | Freq: Every day | SUBCUTANEOUS | Status: DC
  Filled 2013-07-10: qty 0.3

## 2013-07-10 MED ORDER — DEXTROSE 50 % IV SOLN
50.0000 mL | Freq: Once | INTRAVENOUS | Status: AC | PRN
  Administered 2013-07-10: 50 mL via INTRAVENOUS
  Filled 2013-07-10: qty 50

## 2013-07-10 MED ORDER — PANTOPRAZOLE SODIUM 40 MG IV SOLR
40.0000 mg | INTRAVENOUS | Status: DC
  Administered 2013-07-10: 40 mg via INTRAVENOUS
  Filled 2013-07-10: qty 40

## 2013-07-10 MED ORDER — OXYCODONE HCL 5 MG PO TABS: 5.0000 mg | ORAL_TABLET | ORAL | Status: DC | PRN

## 2013-07-10 MED ORDER — ALBUMIN HUMAN 5 % IV SOLN
25.0000 g | Freq: Once | INTRAVENOUS | Status: AC
  Administered 2013-07-10: 25 g via INTRAVENOUS
  Filled 2013-07-10: qty 500

## 2013-07-10 MED ORDER — ACETAMINOPHEN 650 MG RE SUPP: 650.0000 mg | Freq: Four times a day (QID) | RECTAL | Status: DC | PRN

## 2013-07-10 MED ORDER — LEVETIRACETAM 500 MG PO TABS
1000.0000 mg | ORAL_TABLET | Freq: Two times a day (BID) | ORAL | Status: DC
  Administered 2013-07-10 – 2013-07-12 (×7): 1000 mg via ORAL
  Filled 2013-07-10 (×11): qty 2

## 2013-07-10 MED ORDER — ACETAMINOPHEN 325 MG PO TABS
650.0000 mg | ORAL_TABLET | Freq: Four times a day (QID) | ORAL | Status: DC | PRN
  Filled 2013-07-10: qty 2

## 2013-07-10 MED ORDER — DEXTROSE 10 % IV SOLN
INTRAVENOUS | Status: DC
  Administered 2013-07-10 – 2013-07-11 (×2): via INTRAVENOUS
  Filled 2013-07-10 (×4): qty 1000

## 2013-07-10 NOTE — H&P (Signed)
 [facility] History and Physical  [name]:[id number] DOB: 1945-12-17 DOA: 07/06/2013  Referring physician:  EDP PCP:  Duane Lope, MD  Specialists:   Chief Complaint:  Weakness and worsening ABD Swelling and Pain  HPI: George [name] is a 67 y.o. male with Metastatic Renal Cell Carcinoma who presents to the ED with complaints of worsening weakness over the past [redacted] weeks along with poor intake of foods and liquids over the past 3 days. Per his wife he was unable to get out of bed during the day today.  He also has ABD distension and re-accummulation of Ascites, and had had a paracentesis on 12/04.   In the ED, he was found to have hypoglycemia with a glucose level of 34 and he improved after treatment with IV Dextrose X1.     Review of Systems: The patient denies anorexia, fever, chills, headaches, weight loss, vision loss, diplopia, dizziness, decreased hearing, rhinitis, hoarseness, chest pain, syncope, dyspnea on exertion, peripheral edema, balance deficits, cough, hemoptysis, nausea, vomiting, diarrhea, constipation, hematemesis, melena, hematochezia, severe indigestion/heartburn, dysuria, hematuria, incontinence, suspicious skin lesions, transient blindness, difficulty walking, depression, unusual weight change, abnormal bleeding, enlarged lymph nodes, angioedema, and breast masses.    Past Medical History  Diagnosis Date  . Coronary artery disease   . Diabetes mellitus without complication   . Hyperlipidemia   . Cancer     kidney, mets to brain and lung  . 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 Hx of radiation therapy 04/07/13-04/20/13    whole brain/r side renal tumor   . Hypercholesteremia   . Coronary atherosclerosis of native coronary artery   . Osteoarthritis   . HTN (hypertension)   . Seizure   . Malignant ascites 07/06/2013    Past Surgical History  Procedure Laterality Date  . Cardiac  catheterization  03/03    2 stents  . Biopsy of kidney Right 04/05/2013  . Knee surgery Left   . Hemorroidectomy      Prior to Admission medications   Medication Sig Start Date End Date Taking? Authorizing Provider  ACCU-CHEK AVIVA PLUS test strip  06/24/13  Yes Historical Provider, MD  aspirin 325 MG EC tablet Take 1 tablet (325 mg total) by mouth daily. 04/11/13  Yes Leroy Sea, MD  dexamethasone (DECADRON) 4 MG tablet Take 1 tablet (4 mg total) by mouth 2 (two) times daily. 06/21/13  Yes Levert Feinstein, MD  fish oil-omega-3 fatty acids 1000 MG capsule Take 1 g by mouth daily.   Yes Historical Provider, MD  insulin aspart (NOVOLOG) 100 UNIT/ML injection Before each meal 3 times a day, 140-199 - 2 units, 200-250 - 4 units, 251-299 - 6 units,  300-349 - 8 units,  350 or above 10 units. Insulin PEN if approved, provide syringes and needles if needed. 06/21/13  Yes Levert Feinstein, MD  insulin glargine (LANTUS) 100 UNIT/ML injection Inject 72 Units into the skin at bedtime.    Yes Historical Provider, MD  levETIRAcetam (KEPPRA) 1000 MG tablet Take 1 tablet (1,000 mg total) by mouth 2 (two) times daily. 06/21/13  Yes Levert Feinstein, MD  Oxycodone HCl 10 MG TABS Take 0.5-1 tablets (5-10 mg total) by mouth every 4 (four) hours as needed. 07/04/13  Yes Rana Snare, NP  pantoprazole (PROTONIX) 40 MG tablet TAKE 1 TABLET (40 MG TOTAL) BY MOUTH DAILY AT 12 NOON. 06/14/13  Yes Ronney Asters  Moody, MD  psyllium (REGULOID) 0.52 G capsule Take 0.52 g by mouth 2 (two) times daily.    Yes Historical Provider, MD  simvastatin (ZOCOR) 80 MG tablet Take 80 mg by mouth daily.    Yes Historical Provider, MD  SUNItinib (SUTENT) 50 MG capsule Take 50 mg by mouth See admin instructions. Starting on 06/09/13, takes daily x 4 weeks, off for 2 weeks, then repeat.   Yes Historical Provider, MD    Allergies  Allergen Reactions  . Lipitor [Atorvastatin] Other (See Comments)    Made him feel weak  .  Metformin And Related Diarrhea    Social History:   Married and lives at home  reports that he quit smoking about 3 months ago. His smoking use included Cigarettes. He smoked 0.50 packs per day. He does not have any smokeless tobacco history on file. He reports that he does not drink alcohol or use illicit drugs.     Family History  Problem Relation Age of Onset  . Skin cancer Father   . Thyroid cancer Brother   . Kidney cancer Maternal Uncle      Physical Exam:  GEN:  Pleasant Elderly Obese  67 y.o. Caucasian male  examined  and in no acute distress; cooperative with exam Filed Vitals:    2044 07/11/2013 2345  BP: 106/61   Pulse: 93 93  Temp: 98 F (36.7 C)   TempSrc: Oral   Resp: 18   SpO2: 98% 91%   Blood pressure 106/61, pulse 93, temperature 98 F (36.7 C), temperature source Oral, resp. rate 18, SpO2 91.00%. PSYCH: He is alert and oriented x4; does not appear anxious does not appear depressed; affect is normal HEENT: Normocephalic and Atraumatic, Mucous membranes pink; PERRLA; EOM intact; Fundi:  Benign;  No scleral icterus, Nares: Patent, Oropharynx: Clear, Fair Dentition, Neck:  FROM, no cervical lymphadenopathy nor thyromegaly or carotid bruit; no JVD; Breasts:: Not examined CHEST WALL: No tenderness CHEST: Normal respiration, clear to auscultation bilaterally HEART: Regular rate and rhythm; no murmurs rubs or gallops BACK: No kyphosis or scoliosis; no CVA tenderness ABDOMEN: Positive Bowel Sounds, Obese, soft non-tender; no masses, no organomegaly, no pannus; no intertriginous candida. Rectal Exam: Not done EXTREMITIES: No cyanosis, clubbing,  2+EDEMA BLEs; no ulcerations. Genitalia: not examined PULSES: 2+ and symmetric SKIN: Normal hydration no rash or ulceration multiple ecchymotic areas on upper and lower extremities CNS: Cranial nerves 2-12 grossly intact no focal neurologic deficit    Labs on Admission:  Basic Metabolic Panel:  Recent Labs Lab  07/27/2013 2134  NA 130*  K 5.0  CL 100  CO2 21  GLUCOSE 39*  BUN 29*  CREATININE 1.01  CALCIUM 7.7*   Liver Function Tests:  Recent Labs Lab 07/26/2013 2134  AST 19  ALT 15  ALKPHOS 57  BILITOT 0.2*  PROT 4.4*  ALBUMIN 1.8*    Recent Labs Lab 07/06/2013 2134  LIPASE 7*   No results found for this basename: AMMONIA,  in the last 168 hours CBC:  Recent Labs Lab  2134  WBC 3.5*  NEUTROABS 3.0  HGB 11.1*  HCT 34.6*  MCV 87.8  PLT 168   Cardiac Enzymes: No results found for this basename: CKTOTAL, CKMB, CKMBINDEX, TROPONINI,  in the last 168 hours  BNP (last 3 results) No results found for this basename: PROBNP,  in the last 8760 hours CBG:  Recent Labs Lab 07/17/2013 2242 07/05/2013 2326  GLUCAP 34* 140*    Radiological Exams on Admission:  No results found.   EKG: Independently reviewed.    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  Weakness/ Failure to Thrive-  Due to Disease Progression of his Metastatic Cancer.   Supportive Rx initiated, IVFs,a dn Pain control PRN.    2.  Metastatic Renal Cancer with Mets to Brain- continue Dexamethasone Rx.  And Pain Control.  Notify Oncology Dr. Cyndie Chime.    3.   Hypoglycemia-  Monitor Glucose levels and placed on IVFs with D5NSS continuously.    4.   Hyponatremia-  IVFs for rehydration and monitor Na trend.    5.   Malignant Ascites-   IR for Paracentesis in AM, NPO after midnight.    6.   Dehydration-  IVFs, Monitor BUN/Cr.    7.   Seizure disorder- due to Brain Mets, continue Keppra Rx.    8.   Diabetes Mellitus-  Reduced Lantus Rx due to poor intake and hypoglycemia with ordered not to give if Glucose < 100, however, may need to be discontinue if inadequate PO  intake continues.    9.   CAD- stable   10.  Hyperlipidemia- continue on  Simvastatin Rx.    11.  DVT Prophylaxis ordered.          Code Status:   FULL CODE Family Communication:   Wife at Bedside  Disposition Plan:     Inpatient    Time spent:  68 Minutes  Ron Parker [facility] Pager [phone]  If 7PM-7AM, please contact night-coverage [email or web address] Password [facility] 07/10/2013, 12:18 AM

## 2013-07-10 NOTE — ED Notes (Signed)
Dr. Arizona Constable at bedside.

## 2013-07-10 NOTE — Procedures (Signed)
  US guided paracentesis  4.9 liters removed Amber fluid  Pt tolerated well

## 2013-07-10 NOTE — Progress Notes (Signed)
Patient unable to void since admission at 0100. Hospitalist on call made aware and orders received for I & O cath. Patient catherized for 600 cc dark amber urine. Urine sample sent to lab. Patient tolerated procedure well.

## 2013-07-10 NOTE — Progress Notes (Signed)
0715 BS 51. Patient given D50 25mL.

## 2013-07-10 NOTE — Progress Notes (Signed)
I have seen and assessed patient and agree with Dr York Ram assessment and plan. Patient with history of recurrent malignant ascites and recent multiple therapeutic paracenteses since November of 2014. Patient also noted on admission to be hypoglycemic likely secondary to poor oral intake. Patient still with CBGs in the 50s despite D5 normal saline and being on Decadron. Change IV fluids to D10. Patient for therapeutic paracentesis today. Will give a 5% albumin prior to paracentesis. Continue supportive care.

## 2013-07-11 ENCOUNTER — Ambulatory Visit: Admission: RE | Admit: 2013-07-11 | Payer: Medicare Other | Source: Ambulatory Visit | Admitting: Radiation Oncology

## 2013-07-11 ENCOUNTER — Telehealth: Payer: Self-pay | Admitting: *Deleted

## 2013-07-11 ENCOUNTER — Ambulatory Visit (HOSPITAL_COMMUNITY): Payer: Medicare Other

## 2013-07-11 ENCOUNTER — Ambulatory Visit: Payer: Medicare Other

## 2013-07-11 DIAGNOSIS — R627 Adult failure to thrive: Secondary | ICD-10-CM | POA: Diagnosis present

## 2013-07-11 DIAGNOSIS — E43 Unspecified severe protein-calorie malnutrition: Secondary | ICD-10-CM | POA: Diagnosis present

## 2013-07-11 DIAGNOSIS — R7989 Other specified abnormal findings of blood chemistry: Secondary | ICD-10-CM | POA: Diagnosis not present

## 2013-07-11 DIAGNOSIS — R180 Malignant ascites: Secondary | ICD-10-CM

## 2013-07-11 LAB — GLUCOSE, CAPILLARY
Glucose-Capillary: 100 mg/dL — ABNORMAL HIGH (ref 70–99)
Glucose-Capillary: 109 mg/dL — ABNORMAL HIGH (ref 70–99)
Glucose-Capillary: 139 mg/dL — ABNORMAL HIGH (ref 70–99)
Glucose-Capillary: 148 mg/dL — ABNORMAL HIGH (ref 70–99)

## 2013-07-11 LAB — COMPREHENSIVE METABOLIC PANEL
ALT: 14 U/L (ref 0–53)
Albumin: 1.7 g/dL — ABNORMAL LOW (ref 3.5–5.2)
Alkaline Phosphatase: 52 U/L (ref 39–117)
BUN: 34 mg/dL — ABNORMAL HIGH (ref 6–23)
Calcium: 7.2 mg/dL — ABNORMAL LOW (ref 8.4–10.5)
Chloride: 96 mEq/L (ref 96–112)
Potassium: 5.5 mEq/L — ABNORMAL HIGH (ref 3.5–5.1)
Sodium: 127 mEq/L — ABNORMAL LOW (ref 135–145)
Total Bilirubin: 0.3 mg/dL (ref 0.3–1.2)
Total Protein: 4.1 g/dL — ABNORMAL LOW (ref 6.0–8.3)

## 2013-07-11 LAB — CREATININE, URINE, RANDOM: Creatinine, Urine: 158.3 mg/dL

## 2013-07-11 LAB — CBC
HCT: 36 % — ABNORMAL LOW (ref 39.0–52.0)
MCH: 29.1 pg (ref 26.0–34.0)
MCHC: 33.3 g/dL (ref 30.0–36.0)
MCV: 87.4 fL (ref 78.0–100.0)
Platelets: 156 10*3/uL (ref 150–400)
RDW: 21.9 % — ABNORMAL HIGH (ref 11.5–15.5)

## 2013-07-11 LAB — SODIUM, URINE, RANDOM: Sodium, Ur: 10 mEq/L

## 2013-07-11 MED ORDER — ENSURE COMPLETE PO LIQD
237.0000 mL | Freq: Three times a day (TID) | ORAL | Status: DC
  Administered 2013-07-12: 237 mL via ORAL

## 2013-07-11 MED ORDER — PANTOPRAZOLE SODIUM 40 MG PO TBEC
40.0000 mg | DELAYED_RELEASE_TABLET | Freq: Every day | ORAL | Status: DC
  Administered 2013-07-11 – 2013-07-12 (×2): 40 mg via ORAL
  Filled 2013-07-11 (×4): qty 1

## 2013-07-11 MED ORDER — SODIUM CHLORIDE 0.9 % IV SOLN
INTRAVENOUS | Status: DC
  Administered 2013-07-11 (×2): via INTRAVENOUS
  Administered 2013-07-12: 100 mL/h via INTRAVENOUS
  Administered 2013-07-12: 10 mL/h via INTRAVENOUS
  Administered 2013-07-12: 20 mL/h via INTRAVENOUS

## 2013-07-11 MED ORDER — DEXTROSE-NACL 5-0.9 % IV SOLN: INTRAVENOUS | Status: DC

## 2013-07-11 MED ORDER — DEXTROSE 5 % IV SOLN
INTRAVENOUS | Status: DC
  Administered 2013-07-11: 10:00:00 via INTRAVENOUS

## 2013-07-11 MED ORDER — DEXTROSE 5 % IV SOLN
INTRAVENOUS | Status: DC
  Administered 2013-07-11: 11:00:00 via INTRAVENOUS
  Administered 2013-07-14: 20 mL via INTRAVENOUS

## 2013-07-11 NOTE — Progress Notes (Signed)
Patient is followed by Dr. Cyndie Chime.  Per Dr. Carollee Massed request, I called Dr. Patsy Lager office and made his nurse, Sabino Snipes, aware of patient's admission.  Allayne Butcher [facility]  07/11/2013  1:37 PM

## 2013-07-11 NOTE — Telephone Encounter (Addendum)
Received call from Juda RN/[facility] stating pt was admitted for malignant ascites & hypoglycemia by hospitalist Dr Janee Morn & he is in room 1320.  Note to Dr. Truett Perna.

## 2013-07-11 NOTE — Progress Notes (Signed)
Pt's CBG's have been stable with last 3 readings of 101, 98 and 109.  Dr. Janee Morn notified and order received to change CBG's to q4h instead of q2h.  Allayne Butcher [facility]  07/11/2013  4:51 PM

## 2013-07-11 NOTE — Progress Notes (Signed)
Patient has continued to have very poor po intake.  He did not drink any Ensure.  I made a chocolate milkshake and he did not drink any of that either.  He does not want any food to eat despite encouragement from his family and staff.  Will continue to offer food/fluids frequently.  Allayne Butcher [facility]  07/11/2013 4:52 PM

## 2013-07-11 NOTE — Progress Notes (Signed)
Pharmacy: IV to PO Protonix  Patient has been receiving IV Protonix. Per Pharmacy and Therapeutics Committee, this patient meets criteria for auto conversion to PO Protonix:   Tolerating a diet of full liquids or better AND/OR tolerating other medications via enteral route  No GIB  Hessie Knows, PharmD, BCPS Pager [phone] 07/11/2013 10:09 AM

## 2013-07-11 NOTE — Progress Notes (Signed)
OT Cancellation Note  Patient Details Name: George [name] MRN: [id number] DOB: 1946/03/19   Cancelled Treatment:    Reason Eval/Treat Not Completed: Fatigue/lethargy limiting ability to participate  Spoke with wife and she agreed to OT checking on pt tomorrow.  Alba Cory 07/11/2013, 2:23 PM

## 2013-07-11 NOTE — Progress Notes (Signed)
INITIAL NUTRITION ASSESSMENT  Pt meets criteria for severe MALNUTRITION in the context of chronic illness as evidenced by <75% estimated energy intake in the past month with severe fluid accumulation.  DOCUMENTATION CODES Per approved criteria  -Severe malnutrition in the context of chronic illness   INTERVENTION: - Ensure Complete TID - Recommend palliative care consult r/t metastatic renal cell CA with pt not wanting to eat/drink  - Will continue to monitor   NUTRITION DIAGNOSIS: Inadequate oral intake related to poor appetite as evidenced by 0% meal intake.   Goal: Pt to consume >90% of meals/supplements  Monitor:  Weights, labs, intake  Reason for Assessment: Consult   67 y.o. male  Admitting Dx: Weakness generalized  ASSESSMENT: Pt discussed during multidisciplinary rounds. Pt with metastatic renal cell CA with malignant ascites. Per H&P, pt with minimal PO intake x 4 days. Pt unable to answer questions, brother at bedside. He reports he has been trying to get pt to eat/drink, however pt not interested. Weight fluctuates due to ascites and paracentesis, most recently yesterday with 4.9L removed.   - Sodium low - Potassium elevated  Height: Ht Readings from Last 1 Encounters:  07/10/13 6\' 2"  (1.88 m)    Weight: Wt Readings from Last 1 Encounters:  07/10/13 230 lb (104.327 kg)    Ideal Body Weight: 190 lb   % Ideal Body Weight: 121%  Wt Readings from Last 10 Encounters:  07/10/13 230 lb (104.327 kg)  07/04/13 238 lb 3.2 oz (108.047 kg)  06/21/13 225 lb 6.4 oz (102.241 kg)  05/23/13 214 lb 9.6 oz (97.342 kg)  05/19/13 211 lb 11.2 oz (96.026 kg)  05/02/13 210 lb 12.2 oz (95.6 kg)  04/27/13 215 lb (97.523 kg)  04/20/13 211 lb 12.8 oz (96.072 kg)  04/15/13 212 lb 11.2 oz (96.48 kg)  04/13/13 213 lb 1.6 oz (96.662 kg)    Usual Body Weight: 210-238 lb PTA  % Usual Body Weight: 100%  BMI:  Body mass index is 29.52 kg/(m^2).  Estimated Nutritional  Needs: Kcal: 2150-2350 Protein: 105-125g Fluid: 2.1-2.3L/day  Skin: intact   Diet Order: General  EDUCATION NEEDS: -No education needs identified at this time   Intake/Output Summary (Last 24 hours) at 07/11/13 1230 Last data filed at 07/11/13 0500  Gross per 24 hour  Intake 1720.83 ml  Output    350 ml  Net 1370.83 ml    Last BM: 12/5  Labs:   Recent Labs Lab 07/12/2013 2134 07/11/13 0426  NA 130* 127*  K 5.0 5.5*  CL 100 96  CO2 21 20  BUN 29* 34*  CREATININE 1.01 1.53*  CALCIUM 7.7* 7.2*  GLUCOSE 39* 130*    CBG (last 3)   Recent Labs  07/11/13 0744 07/11/13 0950 07/11/13 1142  GLUCAP 148* 126* 101*    Scheduled Meds: . atorvastatin  40 mg Oral q1800  . dexamethasone  4 mg Intravenous Q12H  . levETIRAcetam  1,000 mg Oral BID  . omega-3 acid ethyl esters  1 g Oral Daily  . pantoprazole  40 mg Oral Daily    Continuous Infusions: . sodium chloride 100 mL/hr at 07/11/13 0827  . dextrose 10 mL/hr at 07/11/13 1039    Past Medical History  Diagnosis Date  . Coronary artery disease   . Diabetes mellitus without complication   . Hyperlipidemia   . Cancer     kidney, mets to brain and lung  . 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 Metastatic renal  cell carcinoma to lung 04/27/2013  . Hx of radiation therapy 04/07/13-04/20/13    whole brain/r side renal tumor   . Hypercholesteremia   . Coronary atherosclerosis of native coronary artery   . Osteoarthritis   . HTN (hypertension)   . Seizure   . Malignant ascites 07/06/2013    Past Surgical History  Procedure Laterality Date  . Cardiac catheterization  03/03    2 stents  . Biopsy of kidney Right 04/05/2013  . Knee surgery Left   . Hemorroidectomy      Levon Hedger MS, RD, LDN [phone] Pager [phone] After Hours Pager

## 2013-07-11 NOTE — Progress Notes (Signed)
PT Cancellation Note  Patient Details Name: RAJVIR ERNSTER MRN: [id number] DOB: 1946/05/21   Cancelled Treatment:    Reason Eval/Treat Not Completed: Medical issues which prohibited therapy, per wife/pt in too much pain and too weak. PTA only activity was bed to [facility] with assistance. Will check back tomorrow unless pt feels better.   Rada Hay 07/11/2013, 8:02 AM Blanchard Kelch PT [phone]

## 2013-07-11 NOTE — Progress Notes (Signed)
[facility] PROGRESS NOTE  George [name] WJX:[id number] DOB: Feb 09, 1946 DOA: 07/28/2013 PCP:  Duane Lope, MD  Assessment/Plan: #1 generalized weakness/failure to thrive Likely multifactorial secondary to hypoglycemia and metastatic renal cell carcinoma with metastases to the brain and lungs. CBGs have improved. Continue supportive treatment. Pain management. Follow.  #2 hypoglycemia Likely secondary to poor oral intake. Patient was on D10 yesterday however secondary to hyponatremia this has been discontinued. Patient Decadron was changed to IV. CBGs have ranged from 103-148. Will place on D5 normal saline at [facility]. Continue CBC checks and if stable may change to every 4 hours. Follow.  #3 hyponatremia Likely secondary to hypovolemic hyponatremia. Check a urine sodium and a urine creatinine. Also patient was on D. 10 which may have worsened hyponatremia. Discontinued T10. Place on normal saline. Follow.  #4 malignant ascites Status post ultrasound-guided paracentesis by interventional radiology yesterday with 4.9 L of fluid removed. Patient was in the process of being set up by oncology for twice weekly therapeutic paracentesis. Per oncology.  #5 metastatic renal cell carcinoma with metastases to the brain and the lungs. Continue Decadron. Continue pain management. Continue Keppra for seizure prophylaxis. Will notify oncology of admission. Iron   #6 dehydration IV fluids.  #7 elevated serum creatinine Likely secondary to dehydration. Check a urine sodium check a urine creatinine. IV fluids. If no improvement will need a renal ultrasound.  #8 seizure disorder Secondary to metastatic renal cell carcinoma to the brain. Continue Keppra.  #9 diabetes mellitus Patient had presented with hypoglycemia. Lantus has been discontinued. CBGs have improved and are ranging now from 103- 148. Follow.  #10 bilateral lower extremity edema Likely secondary to third spacing secondary to low  albumin. Monitor closely with IV fluids.  #11 hyperlipidemia Continue statin.  #12 coronary artery disease Stable.  #13 protein calorie malnutrition Will consult with nutrition.  #14 prophylaxis Protonix for GI prophylaxis. SCDs for DVT prophylaxis.  Code Status: Full Family Communication: Updated patient no family at bedside. Disposition Plan: Home when medically stable.   Consultants:  Interventional radiology 07/10/2013  Procedures:  Ultrasound paracentesis 07/10/2013. Interventional radiology. Approximately 4.9 L of amber fluid removed  Antibiotics:  None  HPI/Subjective: Patient denies any nausea no vomiting. No abdominal pain. Patient states he's doing fine. Per nursing patient with minimal oral intake.  Objective: Filed Vitals:   07/11/13 0407  BP: 116/61  Pulse: 78  Temp: 98.4 F (36.9 C)  Resp: 18    Intake/Output Summary (Last 24 hours) at 07/11/13 1018 Last data filed at 07/11/13 0500  Gross per 24 hour  Intake 2535.83 ml  Output    350 ml  Net 2185.83 ml   Filed Weights   07/10/13 0051  Weight: 104.327 kg (230 lb)    Exam:   General:  NAD  Cardiovascular: RRR  Respiratory: CTAB  Abdomen: Soft, nontender, nondistended, positive bowel sounds.  Musculoskeletal: No clubbing no cyanosis. 3+ bilateral lower extremity edema.  Data Reviewed: Basic Metabolic Panel:  Recent Labs Lab 07/13/2013 2134 07/11/13 0426  NA 130* 127*  K 5.0 5.5*  CL 100 96  CO2 21 20  GLUCOSE 39* 130*  BUN 29* 34*  CREATININE 1.01 1.53*  CALCIUM 7.7* 7.2*   Liver Function Tests:  Recent Labs Lab 08/03/2013 2134 07/11/13 0426  AST 19 27  ALT 15 14  ALKPHOS 57 52  BILITOT 0.2* 0.3  PROT 4.4* 4.1*  ALBUMIN 1.8* 1.7*    Recent Labs Lab 07/15/2013 2134  LIPASE 7*  No results found for this basename: AMMONIA,  in the last 168 hours CBC:  Recent Labs Lab 08-05-2013 2134 07/11/13 0426  WBC 3.5* 3.5*  NEUTROABS 3.0  --   HGB 11.1* 12.0*  HCT  34.6* 36.0*  MCV 87.8 87.4  PLT 168 156   Cardiac Enzymes: No results found for this basename: CKTOTAL, CKMB, CKMBINDEX, TROPONINI,  in the last 168 hours BNP (last 3 results) No results found for this basename: PROBNP,  in the last 8760 hours CBG:  Recent Labs Lab 07/11/13 0005 07/11/13 0236 07/11/13 0404 07/11/13 0607 07/11/13 0744  GLUCAP 100* 112* 103* 139* 148*    No results found for this or any previous visit (from the past 240 hour(s)).   Studies: US Paracentesis  07/11/2013   CLINICAL DATA:  Abdominal ascites  EXAM: ULTRASOUND GUIDED right lower quadrant PARACENTESIS  COMPARISON:  Previous paracentesis  PROCEDURE: An ultrasound guided paracentesis was thoroughly discussed with the patient and questions answered. The benefits, risks, alternatives and complications were also discussed. The patient understands and wishes to proceed with the procedure. Written consent was obtained.  Ultrasound was performed to localize and mark an adequate pocket of fluid in the right lower quadrant of the abdomen. The area was then prepped and draped in the normal sterile fashion. 1% Lidocaine was used for local anesthesia. Under ultrasound guidance a 19 gauge Yueh catheter was introduced. Paracentesis was performed. The catheter was removed and a dressing applied.  Complications: None  FINDINGS: A total of approximately 4.9 L of amber fluid was removed. A fluid sample was not sent for laboratory analysis.  IMPRESSION: Successful ultrasound guided paracentesis yielding 4.9 Liters of ascites.  Preprocedure IV albumin per MD.  Read by: Beckey Downing PA-C   Electronically Signed   By: Malachy Moan M.D.   On: 07/11/2013 07:16    Scheduled Meds: . atorvastatin  40 mg Oral q1800  . dexamethasone  4 mg Intravenous Q12H  . levETIRAcetam  1,000 mg Oral BID  . omega-3 acid ethyl esters  1 g Oral Daily  . pantoprazole  40 mg Oral Daily   Continuous Infusions: . 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 If 7PM-7AM, please contact night-coverage at [email or web address], password [facility] 07/11/2013, 10:18 AM  LOS: 2 days

## 2013-07-11 NOTE — Care Management Note (Addendum)
   CARE MANAGEMENT NOTE 07/11/2013  Patient:  ISIDORE, MARGRAF   Account Number:  [id number]  Date Initiated:  07/11/2013  Documentation initiated by:  Ashtin Rosner  Subjective/Objective Assessment:   67 yo male admitted with ascites. PTA pt from home with spouse.PCP: Duane Lope, MD     Action/Plan:   possible SNF placement   Anticipated DC Date:     Anticipated DC Plan:    In-house referral  Clinical Social Worker      DC Planning Services  CM consult      Choice offered to / List presented to:  NA   DME arranged  NA      DME agency  NA     HH arranged  NA      HH agency  NA   Status of service:  In process, will continue to follow Medicare Important Message given?   (If response is "NO", the following Medicare IM given date fields will be blank) Date Medicare IM given:   Date Additional Medicare IM given:    Discharge Disposition:    Per UR Regulation:  Reviewed for med. necessity/level of care/duration of stay  If discussed at Long Length of Stay Meetings, dates discussed:    Comments:  07/11/13 1400 Wirt Hemmerich,MSN,RN [phone] Chart reviewed for utilization of services. Pt's spouse reports inability to care for pt at home. CSW consulted. Will continue to follow

## 2013-07-12 ENCOUNTER — Inpatient Hospital Stay (HOSPITAL_COMMUNITY): Payer: Medicare Other

## 2013-07-12 DIAGNOSIS — E875 Hyperkalemia: Secondary | ICD-10-CM | POA: Diagnosis not present

## 2013-07-12 DIAGNOSIS — G936 Cerebral edema: Secondary | ICD-10-CM | POA: Diagnosis present

## 2013-07-12 DIAGNOSIS — N179 Acute kidney failure, unspecified: Secondary | ICD-10-CM

## 2013-07-12 DIAGNOSIS — E43 Unspecified severe protein-calorie malnutrition: Secondary | ICD-10-CM

## 2013-07-12 LAB — CBC
HCT: 34.2 % — ABNORMAL LOW (ref 39.0–52.0)
Hemoglobin: 11.3 g/dL — ABNORMAL LOW (ref 13.0–17.0)
MCH: 29 pg (ref 26.0–34.0)
MCV: 87.7 fL (ref 78.0–100.0)
RBC: 3.9 MIL/uL — ABNORMAL LOW (ref 4.22–5.81)
WBC: 3.6 10*3/uL — ABNORMAL LOW (ref 4.0–10.5)

## 2013-07-12 LAB — BASIC METABOLIC PANEL
BUN: 48 mg/dL — ABNORMAL HIGH (ref 6–23)
BUN: 53 mg/dL — ABNORMAL HIGH (ref 6–23)
CO2: 15 mEq/L — ABNORMAL LOW (ref 19–32)
CO2: 15 mEq/L — ABNORMAL LOW (ref 19–32)
Chloride: 95 mEq/L — ABNORMAL LOW (ref 96–112)
Chloride: 95 mEq/L — ABNORMAL LOW (ref 96–112)
Creatinine, Ser: 2.41 mg/dL — ABNORMAL HIGH (ref 0.50–1.35)
GFR calc Af Amer: 27 mL/min — ABNORMAL LOW (ref >90)
GFR calc non Af Amer: 23 mL/min — ABNORMAL LOW (ref >90)
Glucose, Bld: 126 mg/dL — ABNORMAL HIGH (ref 70–99)
Glucose, Bld: 103 mg/dL — ABNORMAL HIGH (ref 70–99)
Potassium: 6.1 mEq/L — ABNORMAL HIGH (ref 3.5–5.1)
Potassium: 5.4 mEq/L — ABNORMAL HIGH (ref 3.5–5.1)
Sodium: 127 mEq/L — ABNORMAL LOW (ref 135–145)

## 2013-07-12 LAB — GLUCOSE, CAPILLARY
Glucose-Capillary: 78 mg/dL (ref 70–99)
Glucose-Capillary: 123 mg/dL — ABNORMAL HIGH (ref 70–99)
Glucose-Capillary: 92 mg/dL (ref 70–99)

## 2013-07-12 MED ORDER — ALBUMIN HUMAN 25 % IV SOLN
50.0000 g | Freq: Once | INTRAVENOUS | Status: AC
  Administered 2013-07-12: 50 g via INTRAVENOUS
  Filled 2013-07-12: qty 200

## 2013-07-12 MED ORDER — POLYETHYLENE GLYCOL 3350 17 G PO PACK
17.0000 g | PACK | Freq: Two times a day (BID) | ORAL | Status: DC
  Filled 2013-07-12 (×8): qty 1

## 2013-07-12 MED ORDER — SODIUM POLYSTYRENE SULFONATE 15 GM/60ML PO SUSP
45.0000 g | Freq: Once | ORAL | Status: DC
  Filled 2013-07-12: qty 180

## 2013-07-12 MED ORDER — SENNOSIDES-DOCUSATE SODIUM 8.6-50 MG PO TABS
2.0000 | ORAL_TABLET | Freq: Every day | ORAL | Status: DC
  Administered 2013-07-12: 2 via ORAL
  Filled 2013-07-12 (×4): qty 2

## 2013-07-12 MED ORDER — FUROSEMIDE 10 MG/ML IJ SOLN
40.0000 mg | Freq: Once | INTRAMUSCULAR | Status: AC
  Administered 2013-07-12: 40 mg via INTRAVENOUS
  Filled 2013-07-12: qty 4

## 2013-07-12 MED ORDER — SODIUM BICARBONATE 650 MG PO TABS
650.0000 mg | ORAL_TABLET | Freq: Two times a day (BID) | ORAL | Status: DC
  Administered 2013-07-12 (×2): 650 mg via ORAL
  Filled 2013-07-12 (×8): qty 1

## 2013-07-12 MED ORDER — SODIUM CHLORIDE 0.9 % IV SOLN
1.0000 g | Freq: Once | INTRAVENOUS | Status: AC
  Administered 2013-07-12: 1 g via INTRAVENOUS
  Filled 2013-07-12: qty 10

## 2013-07-12 MED ORDER — SODIUM POLYSTYRENE SULFONATE 15 GM/60ML PO SUSP
45.0000 g | Freq: Once | ORAL | Status: AC
  Administered 2013-07-12: 45 g via RECTAL
  Filled 2013-07-12: qty 180

## 2013-07-12 NOTE — Consult Note (Addendum)
Reason for Consult: Acute renal failure, hyperkalemia, hyponatremia Referring Physician: Ramiro Harvest M.D. ([facility])  HPI:  George [name] is a 67 year old Caucasian man with past medical history significant for coronary artery disease and type 2 diabetes mellitus for the past 15 or so years without any retinopathy/knowledge of nephropathy. About 4 months ago-in August of this year, he had a generalized seizure following which he was found to have brain metastasis as well as hepatic metastasis of renal cell cancer. He was brought to the emergency room 3 days ago with increased weakness and worsening abdominal pain and swelling. Over the past 9 days, it is noted that he has had 3 paracentesis with 3.2-4.9 L fluid removal. In the hospital, he did have some intermittent/relative hypotension primarily on 07/10/13. Over the past 48 hours, daily urine output has been in the oliguric range-325-350 cc per day. Concern is now raised as his creatinine that was 1.0 on admission is now up to 2.4 and he has hyperkalemia (6.1) and worsening hyponatremia in (127).  The patient denies any prior history of renal disease, kidney stones, knowledge of proteinuria/foamy urine, history of hematuria or regular nonsteroidal anti-inflammatory drug use. Over the past 72 hours, he has not had any contrast exposure or nonsteroidal anti-inflammatory drugs. He is not on ACE inhibitor/ARB.    06/24/2013  06/30/2013  Aug 04, 2013  07/11/2013  07/12/2013   BUN 39 (H) 22 29 (H) 34 (H) 48 (H)  Creatinine 1.02 0.76 1.01 1.53 (H) 2.41 (H)    Past Medical History  Diagnosis Date  . Coronary artery disease   . Diabetes mellitus without complication   . Hyperlipidemia   . Cancer     kidney, mets to brain and lung  . 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 Hx of radiation therapy 04/07/13-04/20/13    whole brain/r side renal tumor   . Hypercholesteremia   .  Coronary atherosclerosis of native coronary artery   . Osteoarthritis   . HTN (hypertension)   . Seizure   . Malignant ascites 07/06/2013    Past Surgical History  Procedure Laterality Date  . Cardiac catheterization  03/03    2 stents  . Biopsy of kidney Right 04/05/2013  . Knee surgery Left   . Hemorroidectomy      Family History  Problem Relation Age of Onset  . Skin cancer Father   . Thyroid cancer Brother   . Kidney cancer Maternal Uncle     Social History:  reports that he quit smoking about 3 months ago. His smoking use included Cigarettes. He smoked 0.50 packs per day. He does not have any smokeless tobacco history on file. He reports that he does not drink alcohol or use illicit drugs.  Allergies:  Allergies  Allergen Reactions  . Lipitor [Atorvastatin] Other (See Comments)    Made him feel weak  . Metformin And Related Diarrhea    Medications:  Scheduled: . atorvastatin  40 mg Oral q1800  . dexamethasone  4 mg Intravenous Q12H  . feeding supplement (ENSURE COMPLETE)  237 mL Oral TID WC  . levETIRAcetam  1,000 mg Oral BID  . omega-3 acid ethyl esters  1 g Oral Daily  . pantoprazole  40 mg Oral Daily  . polyethylene glycol  17 g Oral BID  . senna-docusate  2 tablet Oral QHS    Results for orders placed during the hospital encounter of 2013-08-04 (from the past 48  hour(s))  GLUCOSE, CAPILLARY     Status: Abnormal   Collection Time    07/10/13  3:07 PM      Result Value Range   Glucose-Capillary 131 (*) 70 - 99 mg/dL  GLUCOSE, CAPILLARY     Status: None   Collection Time    07/10/13  4:23 PM      Result Value Range   Glucose-Capillary 95  70 - 99 mg/dL  GLUCOSE, CAPILLARY     Status: None   Collection Time    07/10/13  5:59 PM      Result Value Range   Glucose-Capillary 81  70 - 99 mg/dL  GLUCOSE, CAPILLARY     Status: None   Collection Time    07/10/13  8:01 PM      Result Value Range   Glucose-Capillary 91  70 - 99 mg/dL   Comment 1 Notify RN     GLUCOSE, CAPILLARY     Status: None   Collection Time    07/10/13 10:10 PM      Result Value Range   Glucose-Capillary 93  70 - 99 mg/dL   Comment 1 Notify RN    GLUCOSE, CAPILLARY     Status: Abnormal   Collection Time    07/11/13 12:05 AM      Result Value Range   Glucose-Capillary 100 (*) 70 - 99 mg/dL   Comment 1 Notify RN    GLUCOSE, CAPILLARY     Status: Abnormal   Collection Time    07/11/13  2:36 AM      Result Value Range   Glucose-Capillary 112 (*) 70 - 99 mg/dL   Comment 1 Notify RN    GLUCOSE, CAPILLARY     Status: Abnormal   Collection Time    07/11/13  4:04 AM      Result Value Range   Glucose-Capillary 103 (*) 70 - 99 mg/dL   Comment 1 Notify RN    COMPREHENSIVE METABOLIC PANEL     Status: Abnormal   Collection Time    07/11/13  4:26 AM      Result Value Range   Sodium 127 (*) 135 - 145 mEq/L   Potassium 5.5 (*) 3.5 - 5.1 mEq/L   Chloride 96  96 - 112 mEq/L   CO2 20  19 - 32 mEq/L   Glucose, Bld 130 (*) 70 - 99 mg/dL   BUN 34 (*) 6 - 23 mg/dL   Creatinine, Ser 1.09 (*) 0.50 - 1.35 mg/dL   Comment: REPEATED TO VERIFY     DELTA CHECK NOTED   Calcium 7.2 (*) 8.4 - 10.5 mg/dL   Total Protein 4.1 (*) 6.0 - 8.3 g/dL   Albumin 1.7 (*) 3.5 - 5.2 g/dL   AST 27  0 - 37 U/L   Comment: NO VISIBLE HEMOLYSIS     ICTERIC SPECIMEN   ALT 14  0 - 53 U/L   Alkaline Phosphatase 52  39 - 117 U/L   Total Bilirubin 0.3  0.3 - 1.2 mg/dL   GFR calc non Af Amer 45 (*) >90 mL/min   GFR calc Af Amer 53 (*) >90 mL/min   Comment: (NOTE)     The eGFR has been calculated using the CKD EPI equation.     This calculation has not been validated in all clinical situations.     eGFR's persistently <90 mL/min signify possible Chronic Kidney     Disease.  CBC     Status: Abnormal  Collection Time    07/11/13  4:26 AM      Result Value Range   WBC 3.5 (*) 4.0 - 10.5 K/uL   Comment: REPEATED TO VERIFY   RBC 4.12 (*) 4.22 - 5.81 MIL/uL   Hemoglobin 12.0 (*) 13.0 - 17.0 g/dL   HCT  11.9 (*) 14.7 - 52.0 %   MCV 87.4  78.0 - 100.0 fL   MCH 29.1  26.0 - 34.0 pg   MCHC 33.3  30.0 - 36.0 g/dL   RDW 82.9 (*) 56.2 - 13.0 %   Platelets 156  150 - 400 K/uL  GLUCOSE, CAPILLARY     Status: Abnormal   Collection Time    07/11/13  6:07 AM      Result Value Range   Glucose-Capillary 139 (*) 70 - 99 mg/dL  GLUCOSE, CAPILLARY     Status: Abnormal   Collection Time    07/11/13  7:44 AM      Result Value Range   Glucose-Capillary 148 (*) 70 - 99 mg/dL  SODIUM, URINE, RANDOM     Status: None   Collection Time    07/11/13  8:33 AM      Result Value Range   Sodium, Ur 10     Comment: Performed at [facility]  CREATININE, URINE, RANDOM     Status: None   Collection Time    07/11/13  8:33 AM      Result Value Range   Creatinine, Urine 158.3     Comment: Performed at [facility]  GLUCOSE, CAPILLARY     Status: Abnormal   Collection Time    07/11/13  9:50 AM      Result Value Range   Glucose-Capillary 126 (*) 70 - 99 mg/dL  GLUCOSE, CAPILLARY     Status: Abnormal   Collection Time    07/11/13 11:42 AM      Result Value Range   Glucose-Capillary 101 (*) 70 - 99 mg/dL  GLUCOSE, CAPILLARY     Status: None   Collection Time    07/11/13  2:35 PM      Result Value Range   Glucose-Capillary 98  70 - 99 mg/dL  GLUCOSE, CAPILLARY     Status: Abnormal   Collection Time    07/11/13  4:31 PM      Result Value Range   Glucose-Capillary 109 (*) 70 - 99 mg/dL  GLUCOSE, CAPILLARY     Status: None   Collection Time    07/11/13 11:41 PM      Result Value Range   Glucose-Capillary 98  70 - 99 mg/dL   Comment 1 Notify RN    GLUCOSE, CAPILLARY     Status: None   Collection Time    07/12/13  7:04 AM      Result Value Range   Glucose-Capillary 78  70 - 99 mg/dL   Comment 1 Notify RN    GLUCOSE, CAPILLARY     Status: None   Collection Time    07/12/13  8:01 AM      Result Value Range   Glucose-Capillary 93  70 - 99 mg/dL  CBC     Status: Abnormal   Collection  Time    07/12/13  8:20 AM      Result Value Range   WBC 3.6 (*) 4.0 - 10.5 K/uL   RBC 3.90 (*) 4.22 - 5.81 MIL/uL   Hemoglobin 11.3 (*) 13.0 - 17.0 g/dL   HCT 86.5 (*)  39.0 - 52.0 %   MCV 87.7  78.0 - 100.0 fL   MCH 29.0  26.0 - 34.0 pg   MCHC 33.0  30.0 - 36.0 g/dL   RDW 16.1 (*) 09.6 - 04.5 %   Platelets 172  150 - 400 K/uL  BASIC METABOLIC PANEL     Status: Abnormal   Collection Time    07/12/13  9:25 AM      Result Value Range   Sodium 127 (*) 135 - 145 mEq/L   Potassium 6.1 (*) 3.5 - 5.1 mEq/L   Comment: NO VISIBLE HEMOLYSIS     RESULTS VERIFIED VIA RECOLLECT   Chloride 95 (*) 96 - 112 mEq/L   CO2 15 (*) 19 - 32 mEq/L   Glucose, Bld 126 (*) 70 - 99 mg/dL   BUN 48 (*) 6 - 23 mg/dL   Creatinine, Ser 4.09 (*) 0.50 - 1.35 mg/dL   Comment: DELTA CHECK NOTED     RESULTS VERIFIED VIA RECOLLECT   Calcium 6.8 (*) 8.4 - 10.5 mg/dL   GFR calc non Af Amer 26 (*) >90 mL/min   GFR calc Af Amer 30 (*) >90 mL/min   Comment: (NOTE)     The eGFR has been calculated using the CKD EPI equation.     This calculation has not been validated in all clinical situations.     eGFR's persistently <90 mL/min signify possible Chronic Kidney     Disease.  GLUCOSE, CAPILLARY     Status: Abnormal   Collection Time    07/12/13 11:54 AM      Result Value Range   Glucose-Capillary 122 (*) 70 - 99 mg/dL    US Renal  81/08/9145   CLINICAL DATA:  History of acute renal failure. History of metastatic renal cell carcinoma. History of hypertension and diabetes. History of right renal mass.  EXAM: RENAL/URINARY TRACT ULTRASOUND COMPLETE  COMPARISON:  CT 06/24/2013  FINDINGS: Right Kidney:  Length: Right renal length is 12.5 cm. There is a lobulated solid mass involving the mid to lower portion of the right kidney. On the previous CT examination the mass measured 11 x 9 cm. On the current study it measures 9 x 8 x 4 cm. Echogenicity within normal limits. There is a lower pole cyst measuring 1.7 x 2.3 x 1.7 cm.  Low intensity internal echoes may reflect debris or artifact. No calculus, or hydronephrosis visualized.  Left Kidney:  Length: Left renal length is 12.7 cm. Echogenicity within normal limits. No mass or hydronephrosis visualized.  Bladder:  Urinary bladder is drained by a Foley catheter.  There is ascites.  IMPRESSION: History of renal cell carcinoma and right renal mass. There is a lobulated solid mass involving the mid to lower portion of the right kidney. On the previous CT examination the mass measured 11 x 9 cm. On the current study it measures 9 x 8 x 4 cm. Right renal cyst.  No evidence of hydronephrosis.  No left renal lesion demonstrated.  Ascites.  Bladder drained by Foley catheter.   Electronically Signed   By: Onalee Hua  Call M.D.   On: 07/12/2013 14:26    Review of Systems  Unable to perform ROS: mental acuity   George [name] displayed a severe delay in comprehending and answering the questions-these were primarily answered by his brother and wife at bedside.   Blood pressure 122/66, pulse 76, temperature 96.1 F (35.6 C), temperature source Axillary, resp. rate 16, height 6\' 2"  (1.88 m), weight 104.327  kg (230 lb), SpO2 98.00%. Physical Exam  Nursing note and vitals reviewed. Constitutional: He appears well-developed and well-nourished. No distress.  HENT:  Head: Normocephalic and atraumatic.  Nose: Nose normal.  Mouth/Throat: No oropharyngeal exudate.  Eyes: Conjunctivae are normal. Pupils are equal, round, and reactive to light. No scleral icterus.  Neck: Normal range of motion. Neck supple. No JVD present. No tracheal deviation present. No thyromegaly present.  Cardiovascular: Normal rate, regular rhythm and normal heart sounds.  Exam reveals no friction rub.   No murmur heard. Respiratory: Effort normal. No respiratory distress. He has no wheezes. He has rales. He exhibits no tenderness.  GI: Bowel sounds are normal. He exhibits distension. He exhibits no mass. There is tenderness.  There is no rebound and no guarding.  Musculoskeletal: Normal range of motion. He exhibits edema. He exhibits no tenderness.  3+ bilateral lower extremity edema  Lymphadenopathy:    He has no cervical adenopathy.  Neurological: He is alert.  Difficult to assess orientation  Skin: Skin is warm and dry. No rash noted. No erythema.  Psychiatric:  Unable to assess mood    Assessment/Plan: 1. Acute renal failure: Low urinary sodium is indicated of intravascular volume contraction however, he does have interstitial overload with significant pedal edema and no emerging rales on lung exam. I suspect that he possibly has significant splanchnic vasoconstriction almost akin to hepatorenal syndrome. He did have episodes of relative hypotension that raise suspicion for ATN (ischemic). The major concern is obviously his poor urine output and worsening electrolytes. I discussed earlier with Dr. Janee Morn to have his normal saline discontinued and to attempt some furosemide primarily for potassium wasting together with Kayexalate. I will try albumin at  500mg  per kilogram (50 g daily infusion for the next 3 days) to see if I can improve hishemodynamic status without compromising pulmonary edema. I discussed with the patient (unclear as to how much he understands) as well as wife regarding the limitations with his management. I informed them that I would advocate for medical measures however would withhold dialysis fearing that this may in fact lead to further clinical decline and compromise his already compromised quality of life. Renal ultrasound pending. Urinalysis not supportive of glomerulonephritis. 2. Hyperkalemia: Secondary to acute renal failure, medications reviewed and not found to be on supplemental potassium. Attempting Kayexalate as well as furosemide. 3. Hyponatremia: Primarily due to acute renal failure and possibly due to the hemodynamic alterations from ascites. Attempt furosemide and intravenous  albumin. 4. Anion gap metabolic acidosis: Secondary to acute renal failure, will place on sodium bicarbonate supplementation to avoid tachypnea/respiratory compromise.  Overall prognosis appearing poor at this point with limited options for management.  Violia Knopf K. 07/12/2013, 2:30 PM

## 2013-07-12 NOTE — Evaluation (Signed)
Occupational Therapy Evaluation Patient Details Name: George [name] MRN: [id number] DOB: 1945/08/16 Today's Date: 07/12/2013 Time: 4098-1191 OT Time Calculation (min): 26 min  OT Assessment / Plan / Recommendation History of present illness George [name] is a 67 y.o. male with Metastatic Renal Cell Carcinoma who presents to the ED with complaints of worsening weakness over the past [redacted] weeks along with poor intake of foods and liquids over the past 3 days. Per his wife he was unable to get out of bed during the day today.  He also has ABD distension and re-accummulation of Ascites, and had had a paracentesis on 12/04.   In the ED, he was found to have hypoglycemia with a glucose level of 34 and he improved after treatment with IV Dextrose X1.  Admitted w/ dx: Weakness, ABD pain and swelling.   Clinical Impression   Pt presents w/ dx as above impacting his ability to perform ADL and functional transfers. Pt's wife reports that she works and pt is at home alone at times, Pt is overall +2 total assist & therefore recommending acute OT followed by SNF Rehab.    OT Assessment  Patient needs continued OT Services    Follow Up Recommendations  SNF    Barriers to Discharge      Equipment Recommendations  None recommended by OT;Other (comment) (Defer to next venue)    Recommendations for Other Services    Frequency  Min 2X/week    Precautions / Restrictions Precautions Precautions: Fall Precaution Comments:  (PLOF per wife, pt transferred bed to 3:1 only) Restrictions Weight Bearing Restrictions: No   Pertinent Vitals/Pain None stated.    ADL  Eating/Feeding: Simulated;Minimal assistance Where Assessed - Eating/Feeding: Bed level Grooming: Simulated;Set up;Minimal assistance Where Assessed - Grooming: Supported sitting Upper Body Bathing: Simulated;Maximal assistance Where Assessed - Upper Body Bathing: Supported sitting Lower Body Bathing: Simulated;+2 Total assistance Lower Body  Bathing: Patient Percentage: 40% Where Assessed - Lower Body Bathing: Supported sit to stand Upper Body Dressing: Performed;Moderate assistance Where Assessed - Upper Body Dressing: Supported sitting Lower Body Dressing: Performed;+2 Total assistance Lower Body Dressing: Patient Percentage: 0% Where Assessed - Lower Body Dressing: Supported sit to stand Toilet Transfer: Simulated;+2 Total assistance (Pt transferred EOB to chair today, significant posterior lean noted) Toilet Transfer: Patient Percentage: 40% Toilet Transfer Method: Surveyor, minerals: Materials engineer and Hygiene: Simulated;+2 Total assistance Toileting - Architect and Hygiene: Patient Percentage: 0% Where Assessed - Engineer, mining and Hygiene: Sit to stand from 3-in-1 or toilet Tub/Shower Transfer Method: Not assessed Equipment Used: Gait belt Transfers/Ambulation Related to ADLs: Pt overall Mod assist sitting EOB w/ frequent VC's & TC's to shift weight (posterior lean noted). Pt +2 total assist for sit to stand w/ strong posterior lean noted and difficulty advancing feet, pt approximately 40%. ADL Comments: Pt and family were educated in role of OT. Discussed recommendation for SNF Rehab secondary to recent decline in status and increased need for assist. Pt's wife reports she works during day and is not available 24/7. Pt is currently requiring more care than his spouse can provide. Discussed ADL's and functional transfer goals, pt's wife & his brother verbalized agreement w/ this, pt unable.    OT Diagnosis: Generalized weakness;Cognitive deficits  OT Problem List: Decreased strength;Decreased activity tolerance;Impaired balance (sitting and/or standing);Decreased knowledge of precautions;Decreased knowledge of use of DME or AE;Decreased safety awareness;Decreased cognition OT Treatment Interventions: Self-care/ADL training;DME and/or AE  instruction;Patient/family education;Cognitive remediation/compensation;Therapeutic  activities;Balance training;Therapeutic exercise   OT Goals(Current goals can be found in the care plan section) Acute Rehab OT Goals Patient Stated Goal: None stated by Pt. Per wife, SNF rehab Time For Goal Achievement: 07/26/13 Potential to Achieve Goals: Fair  Visit Information  Last OT Received On: 07/12/13 Assistance Needed: +2 History of Present Illness: George [name] is a 67 y.o. male with Metastatic Renal Cell Carcinoma who presents to the ED with complaints of worsening weakness over the past [redacted] weeks along with poor intake of foods and liquids over the past 3 days. Per his wife he was unable to get out of bed during the day today.  He also has ABD distension and re-accummulation of Ascites, and had had a paracentesis on 12/04.   In the ED, he was found to have hypoglycemia with a glucose level of 34 and he improved after treatment with IV Dextrose X1.  Admitted w/ dx: Weakness, ABD pain and swelling.       Prior Functioning     Home Living Family/patient expects to be discharged to:: Private residence Living Arrangements: Spouse/significant other Available Help at Discharge: Available PRN/intermittently;Family Type of Home: House Home Access: Stairs to enter [employer] of Steps: 2 Entrance Stairs-Rails: None Home Layout: One level Home Equipment: Walker - 2 wheels;Bedside commode;Shower seat Additional Comments: Home info from pt's wife, pt unable to assist or answer questions RE: PLOF Prior Function Level of Independence: Needs assistance Gait / Transfers Assistance Needed: Pt transferred from bed to 3:1 only prior to this hospitialization. Did not ambulate other than to bathroom. Has RW does not use. ADL's / Homemaking Assistance Needed: Assistance w/ bathing and dressing Communication Communication: No difficulties Dominant Hand: Right    Vision/Perception Vision -  History Baseline Vision: Wears glasses all the time Vision - Assessment Vision Assessment: Vision not tested   Cognition  Cognition Arousal/Alertness: Awake/alert Behavior During Therapy: WFL for tasks assessed/performed Overall Cognitive Status: History of cognitive impairments - at baseline Memory: Decreased short-term memory    Extremity/Trunk Assessment Upper Extremity Assessment Upper Extremity Assessment: Generalized weakness Lower Extremity Assessment Lower Extremity Assessment: Defer to PT evaluation    Mobility Bed Mobility Bed Mobility: Supine to Sit;Sitting - Scoot to Edge of Bed Supine to Sit: 1: +2 Total assist;HOB elevated Supine to Sit: Patient Percentage: 40% Sitting - Scoot to Edge of Bed: 1: +2 Total assist Sitting - Scoot to Edge of Bed: Patient Percentage: 30% Details for Bed Mobility Assistance: Pt required verbal and tactile cues, Mod assist to sit EOB. Strong posterior lean noted and difficulty following commands & shifting weight during bed mobility noted. Pt required assist from pad to bring hips/trunk to EOB. Transfers Transfers: Sit to Stand;Stand to Sit Sit to Stand: 1: +2 Total assist;From bed;With upper extremity assist Sit to Stand: Patient Percentage: 40% Stand to Sit: 1: +2 Total assist;To chair/3-in-1 Stand to Sit: Patient Percentage: 40% Details for Transfer Assistance: Strong posterior lean, noted difficulty shifting weight and advancing feet.        Balance Balance Balance Assessed: Yes Static Sitting Balance Static Sitting - Balance Support: Bilateral upper extremity supported;Feet supported Static Sitting - Level of Assistance: 3: Mod assist;2: Max assist Static Sitting - Comment/# of Minutes: Pt required verbal & tactile cues for sitting EOB secondary to posterior lean w/ fluctuating level of assist Mod-max noted.   End of Session OT - End of Session Equipment Utilized During Treatment: Gait belt Activity Tolerance: Patient tolerated  treatment well Patient left:  in chair;with call bell/phone within reach;with family/visitor present Nurse Communication: Mobility status  GO     Alm Bustard 07/12/2013, 10:26 AM

## 2013-07-12 NOTE — Clinical Documentation Improvement (Signed)
  Per 12/4 MRI Brain w/o contrast:"Extent of vasogenic edema in the left frontal lobe is slightly diminished." This patient is being treated with Decadron 4mg  IV q12h. In the Coding world this is a significant finding. If you agree with the finding of "vasogenic edema" please add to documentation to illustrate this patient's severity of illness and risk of mortality. Thank you.  Thank You, Beverley Fiedler ,RN Clinical Documentation Specialist:  [phone]  [facility]- Health Information Management

## 2013-07-12 NOTE — Evaluation (Signed)
Physical Therapy Evaluation Patient Details Name: George [name] MRN: [id number] DOB: 1945-10-01 Today's Date: 07/12/2013 Time: 1027-2536 PT Time Calculation (min): 26 min  PT Assessment / Plan / Recommendation History of Present Illness  George [name] is a 67 y.o. male with Metastatic Renal Cell Carcinoma who presents to the ED with complaints of worsening weakness over the past [redacted] weeks along with poor intake of foods and liquids over the past 3 days. Per his wife he was unable to get out of bed during the day today.  He also has ABD distension and re-accummulation of Ascites, and had had a paracentesis on 12/04.   In the ED, he was found to have hypoglycemia with a glucose level of 34 and he improved after treatment with IV Dextrose X1.  Admitted w/ dx: Weakness, ABD pain and swelling.  Clinical Impression  Pt presents with significant weakness requiring +2 for safety with transfers and mobility.  Will benefit from continued PT services as well as SNF at d/c to improve strength, mobility and activity tolerance.    PT Assessment  Patient needs continued PT services    Follow Up Recommendations  SNF    Does the patient have the potential to tolerate intense rehabilitation      Barriers to Discharge Decreased caregiver support wife works    Equipment Recommendations  None recommended by PT    Recommendations for Other Services     Frequency Min 3X/week    Precautions / Restrictions Precautions Precautions: Fall Precaution Comments:  (PLOF per wife, pt transferred bed to 3:1 only) Restrictions Weight Bearing Restrictions: No   Pertinent Vitals/Pain Pt with wincing, grimacing, touching abdomen, repositioned appropriately      Mobility  Bed Mobility Bed Mobility: Supine to Sit;Sitting - Scoot to Edge of Bed Supine to Sit: 1: +2 Total assist;HOB elevated Supine to Sit: Patient Percentage: 40% Sitting - Scoot to Edge of Bed: 1: +2 Total assist Sitting - Scoot to Edge of Bed:  Patient Percentage: 30% Details for Bed Mobility Assistance: Pt required verbal and tactile cues, Mod assist to sit EOB. Strong posterior lean noted and difficulty following commands & shifting weight during bed mobility noted. Pt required assist from pad to bring hips/trunk to EOB. Transfers Sit to Stand: 1: +2 Total assist;From bed;With upper extremity assist Sit to Stand: Patient Percentage: 40% Stand to Sit: 1: +2 Total assist;To chair/3-in-1 Stand to Sit: Patient Percentage: 40% Details for Transfer Assistance: Strong posterior lean, noted difficulty shifting weight and advancing feet.    Exercises     PT Diagnosis: Difficulty walking;Generalized weakness  PT Problem List: Decreased strength;Decreased knowledge of use of DME;Decreased activity tolerance;Decreased balance;Decreased mobility PT Treatment Interventions: DME instruction;Balance training;Modalities;Gait training;Neuromuscular re-education;Stair training;Functional mobility training;Patient/family education;Therapeutic activities;Therapeutic exercise;Wheelchair mobility training     PT Goals(Current goals can be found in the care plan section) Acute Rehab PT Goals Patient Stated Goal: None stated by Pt. Per wife, SNF rehab PT Goal Formulation: With family Time For Goal Achievement: 07/26/13 Potential to Achieve Goals: Good  Visit Information  Last PT Received On: 07/12/13 Assistance Needed: +2 PT/OT/SLP Co-Evaluation/Treatment: Yes PT goals addressed during session: Mobility/safety with mobility History of Present Illness: George [name] is a 67 y.o. male with Metastatic Renal Cell Carcinoma who presents to the ED with complaints of worsening weakness over the past [redacted] weeks along with poor intake of foods and liquids over the past 3 days. Per his wife he was unable to get out of bed during  the day today.  He also has ABD distension and re-accummulation of Ascites, and had had a paracentesis on 12/04.   In the ED, he was  found to have hypoglycemia with a glucose level of 34 and he improved after treatment with IV Dextrose X1.  Admitted w/ dx: Weakness, ABD pain and swelling.       Prior Functioning  Home Living Family/patient expects to be discharged to:: Private residence Living Arrangements: Spouse/significant other Available Help at Discharge: Available PRN/intermittently;Family Type of Home: House Home Access: Stairs to enter [employer] of Steps: 2 Entrance Stairs-Rails: None Home Layout: One level Home Equipment: Walker - 2 wheels;Bedside commode;Shower seat Additional Comments: Home info from pt's wife, pt unable to assist or answer questions RE: PLOF Prior Function Level of Independence: Needs assistance Gait / Transfers Assistance Needed: Pt transferred from bed to 3:1 only prior to this hospitialization. Did not ambulate other than to bathroom. Has RW does not use. ADL's / Homemaking Assistance Needed: Assistance w/ bathing and dressing Communication Communication: No difficulties Dominant Hand: Right    Cognition  Cognition Arousal/Alertness: Awake/alert Behavior During Therapy: WFL for tasks assessed/performed Overall Cognitive Status: History of cognitive impairments - at baseline Memory: Decreased short-term memory    Extremity/Trunk Assessment Upper Extremity Assessment Upper Extremity Assessment: Generalized weakness Lower Extremity Assessment Lower Extremity Assessment: Generalized weakness Cervical / Trunk Assessment Cervical / Trunk Assessment: Kyphotic   Balance Balance Balance Assessed: Yes Static Sitting Balance Static Sitting - Balance Support: Bilateral upper extremity supported;Feet supported Static Sitting - Level of Assistance: 3: Mod assist;2: Max assist Static Sitting - Comment/# of Minutes: pt requires verbal and tactile cues for sequencing and sitting balance.  strong posterior lean  End of Session PT - End of Session Equipment Utilized During  Treatment: Gait belt Activity Tolerance: Patient limited by fatigue Patient left: in chair;with family/visitor present;with call bell/phone within reach Nurse Communication: Mobility status  GP     Taven Strite 07/12/2013, 10:51 AM

## 2013-07-12 NOTE — Progress Notes (Signed)
Patient had frequent small bowel movements after enema, pasty in consistency,

## 2013-07-12 NOTE — Progress Notes (Signed)
Patient has urge to have bowel movement , placed in bedpan and bedside commode several times,with much difficulty because of weakness and limited mobility.Patient unable to have bowel movement , soap suds enema given as ordered,with return flow of same solution but no bm.

## 2013-07-12 NOTE — Progress Notes (Signed)
Clinical Social Work Department CLINICAL SOCIAL WORK PLACEMENT NOTE 07/12/2013  Patient:  George [name], George [name]  Account Number:  [id number] Admit date:  07/26/2013  Clinical Social Worker:  Jacelyn Grip  Date/time:  07/12/2013 01:05 PM  Clinical Social Work is seeking post-discharge placement for this patient at the following level of care:   SKILLED NURSING   (*CSW will update this form in Epic as items are completed)   07/12/2013  Patient/family provided with [facility] Department of Clinical Social Work's list of facilities offering this level of care within the geographic area requested by the patient (or if unable, by the patient's family).  07/12/2013  Patient/family informed of their freedom to choose among providers that offer the needed level of care, that participate in Medicare, Medicaid or managed care program needed by the patient, have an available bed and are willing to accept the patient.  07/12/2013  Patient/family informed of [facility]' ownership interest in [facility], as well as of the fact that they are under no obligation to receive care at this facility.  PASARR submitted to EDS on 07/12/2013 PASARR number received from EDS on 07/12/2013  FL2 transmitted to all facilities in geographic area requested by pt/family on  07/12/2013 FL2 transmitted to all facilities within larger geographic area on   Patient informed that his/her managed care company has contracts with or will negotiate with  certain facilities, including the following:     Patient/family informed of bed offers received:   Patient chooses bed at  Physician recommends and patient chooses bed at    Patient to be transferred to  on   Patient to be transferred to facility by   The following physician request were entered in Epic:   Additional Comments:   Jacklynn Lewis, MSW, LCSWA  Clinical Social Work [phone]

## 2013-07-12 NOTE — Progress Notes (Signed)
[facility] PROGRESS NOTE  George [name]:[id number] DOB: March 19, 1946 DOA: 08/01/2013 PCP:  Duane Lope, MD  Assessment/Plan: #1 generalized weakness/failure to thrive Likely multifactorial secondary to hypoglycemia and metastatic renal cell carcinoma with metastases to the brain and lungs. CBGs have improved. Continue supportive treatment. Pain management. Follow.  #2 hypoglycemia Likely secondary to poor oral intake. Patient was on D10 however secondary to hyponatremia this has been discontinued. Patient's Decadron was changed to IV secondary to poor oral intake. CBGs have ranged from 78-93. Continue D5 normal saline at [facility]. Continue CBG checks every 4 hours. Follow.  #3 hyponatremia Likely secondary to hypervolemic hyponatremia in the setting of D10. Patient with poor oral intake. Patient also with bilateral lower extremity edema and some crackles on lung examination. Urine sodium is 10. D. 10 is been discontinued. Will KVO IV fluids. Will give a dose of Lasix 40 mg IV x1.. Check a urine sodium and a urine creatinine. Also patient was on D. 10 which may have worsened hyponatremia. Discontinued D10. Follow.  #4 malignant ascites Status post ultrasound-guided paracentesis by interventional radiology 07/10/13 with 4.9 L of fluid removed. Patient was in the process of being set up by oncology for twice weekly therapeutic paracentesis. Per oncology.  #5 metastatic renal cell carcinoma with metastases to the brain and the lungs/vasogenic cerebral edema Continue Decadron. Continue pain management. Continue Keppra for seizure prophylaxis. Patient's oncologist has been notified of admission. Patient's oncologist is currently out of town and will be back tomorrow.   #6 dehydration KVO IV fluids.  #7 ARF Likely secondary to prerenal azotemia in the setting of volume overload. Patient with bilateral lower extremity edema and some crackles noted on exam. Urine sodium is pending. Will check a  renal ultrasound as patient does have a history of metastatic renal cell carcinoma. Patient's urine output has been 325 cc over the past 24 hours. We'll give a dose of Lasix 40 mg IV x1. Patient with poor prognosis secondary to his metastatic renal cell carcinoma. Will also consult with renal for further evaluation and management. dehydration.  #8 hyperkalemia Likely secondary to problem #7. Will check a EKG. Potassium today is 6.1. We'll give a dose of calcium gluconate x1. Given a dose of Lasix 40 mg IV x1. Will also give Kayexalate 45 g by mouth x1. Repeat basic metabolic profile this afternoon. Follow.  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 Stable. No seizures noted. Continue Keppra.  #10 diabetes mellitus Patient had presented with hypoglycemia. Lantus has been discontinued. CBGs have improved and are ranging now from 78- 98. Follow.  #11 bilateral lower extremity edema Likely secondary to third spacing secondary to low albumin. KVO IV fluids. Will give a dose of Lasix 40 mg x1 IV.  #12 hyperlipidemia Continue statin.  #13 coronary artery disease Stable.  #14 protein calorie malnutrition Patient with poor oral intake. Patient has been seen by dietitian and started on ensure.  #15 constipation Likely secondary to pain medications. Patient can receive some Kayexalate today. We will place on soapsuds enema as needed. Place on MiraLAX twice daily and Senokot tablet.  #14 prophylaxis Protonix for GI prophylaxis. SCDs for DVT prophylaxis.  #15 prognosis Patient with a poor prognosis with metastatic renal cell carcinoma with metastases to the brain and the lungs. Patient with poor oral intake and probable failure to thrive. Patient also now with worsening renal function. As of 07/04/2013 per note from patient's oncologist's office visit it was felt that  it was too early to call a treatment failure. Patient's oncologist is currently out of town and  will return back tomorrow. Patient's oncologist will likely reassess patient tomorrow to decide on patient's prognosis and as to whether palliative care might be the right direction to go at this time. Will defer decisions of prognosis to patient's oncologist.  Code Status: Full Family Communication: Updated patient and wife at bedside. Disposition Plan: Home vs SNF when medically stable.   Consultants:  Interventional radiology 07/10/2013  Renal pending  Procedures:  Ultrasound paracentesis 07/10/2013. Interventional radiology. Approximately 4.9 L of amber fluid removed  Antibiotics:  None  HPI/Subjective: Patient denies any nausea no vomiting. No abdominal pain. Patient sitting on beside commode trying to have BM. Per nursing patient with minimal oral intake.  Objective: Filed Vitals:   07/12/13 0715  BP: 122/66  Pulse: 76  Temp: 96.1 F (35.6 C)  Resp: 16    Intake/Output Summary (Last 24 hours) at 07/12/13 1051 Last data filed at 07/11/13 2300  Gross per 24 hour  Intake  598.5 ml  Output    225 ml  Net  373.5 ml   Filed Weights   07/10/13 0051  Weight: 104.327 kg (230 lb)    Exam:   General:  NAD  Cardiovascular: RRR  Respiratory: Bibasilar crackles  Abdomen: Soft, nontender, nondistended, positive bowel sounds.  Musculoskeletal: No clubbing no cyanosis. 3+ bilateral lower extremity edema.  Data Reviewed: Basic Metabolic Panel:  Recent Labs Lab 08/03/2013 2134 07/11/13 0426 07/12/13 0925  NA 130* 127* 127*  K 5.0 5.5* 6.1*  CL 100 96 95*  CO2 21 20 15*  GLUCOSE 39* 130* 126*  BUN 29* 34* 48*  CREATININE 1.01 1.53* 2.41*  CALCIUM 7.7* 7.2* 6.8*   Liver Function Tests:  Recent Labs Lab 14-Jul-2013 2134 07/11/13 0426  AST 19 27  ALT 15 14  ALKPHOS 57 52  BILITOT 0.2* 0.3  PROT 4.4* 4.1*  ALBUMIN 1.8* 1.7*    Recent Labs Lab Jul 14, 2013 2134  LIPASE 7*   No results found for this basename: AMMONIA,  in the last 168  hours CBC:  Recent Labs Lab 07/09/2013 2134 07/11/13 0426 07/12/13 0820  WBC 3.5* 3.5* 3.6*  NEUTROABS 3.0  --   --   HGB 11.1* 12.0* 11.3*  HCT 34.6* 36.0* 34.2*  MCV 87.8 87.4 87.7  PLT 168 156 172   Cardiac Enzymes: No results found for this basename: CKTOTAL, CKMB, CKMBINDEX, TROPONINI,  in the last 168 hours BNP (last 3 results) No results found for this basename: PROBNP,  in the last 8760 hours CBG:  Recent Labs Lab 07/11/13 1435 07/11/13 1631 07/11/13 2341 07/12/13 0704 07/12/13 0801  GLUCAP 98 109* 98 78 93    No results found for this or any previous visit (from the past 240 hour(s)).   Studies: US Paracentesis  07/11/2013   CLINICAL DATA:  Abdominal ascites  EXAM: ULTRASOUND GUIDED right lower quadrant PARACENTESIS  COMPARISON:  Previous paracentesis  PROCEDURE: An ultrasound guided paracentesis was thoroughly discussed with the patient and questions answered. The benefits, risks, alternatives and complications were also discussed. The patient understands and wishes to proceed with the procedure. Written consent was obtained.  Ultrasound was performed to localize and mark an adequate pocket of fluid in the right lower quadrant of the abdomen. The area was then prepped and draped in the normal sterile fashion. 1% Lidocaine was used for local anesthesia. Under ultrasound guidance a 19 gauge Yueh catheter was  introduced. Paracentesis was performed. The catheter was removed and a dressing applied.  Complications: None  FINDINGS: A total of approximately 4.9 L of amber fluid was removed. A fluid sample was not sent for laboratory analysis.  IMPRESSION: Successful ultrasound guided paracentesis yielding 4.9 Liters of ascites.  Preprocedure IV albumin per MD.  Read by: Beckey Downing PA-C   Electronically Signed   By: Malachy Moan M.D.   On: 07/11/2013 07:16    Scheduled Meds: . atorvastatin  40 mg Oral q1800  . calcium gluconate  1 g Intravenous Once  . dexamethasone  4 mg  Intravenous Q12H  . feeding supplement (ENSURE COMPLETE)  237 mL Oral TID WC  . levETIRAcetam  1,000 mg Oral BID  . omega-3 acid ethyl esters  1 g Oral Daily  . pantoprazole  40 mg Oral Daily   Continuous Infusions: . sodium chloride 100 mL/hr (07/12/13 0325)  . dextrose 10 mL/hr at 07/11/13 1039    Principal Problem:   Weakness generalized Active Problems:   Diabetes mellitus   Hyperlipidemia   Seizure   CAD (coronary artery disease)   Lung metastases   Metastatic renal cell carcinoma to brain   Malignant ascites   Dehydration   Hypoglycemia   Hyponatremia   Elevated serum creatinine   FTT (failure to thrive) in adult   Protein-calorie malnutrition, severe   Hyperkalemia   ARF (acute renal failure)   Vasogenic cerebral edema    Time spent: 35 mins    [facility] MD [facility] Pager [phone]. If 7PM-7AM, please contact night-coverage at [email or web address], password [facility] 07/12/2013, 10:51 AM  LOS: 3 days

## 2013-07-12 NOTE — Clinical Documentation Improvement (Signed)
  Note 12/8 gives diagnosis of "protein calorie malnutrition".  Per RD evaluation 12/8:"Pt meets criteria for Severe Malnutrition in the context of chronic illness as evidenced by <75% estimated energy intake in the past month with severe fluid accumulation."   If you agree with RD evaluation please add to documentation to illustrate this patient's severity of illness and risk of mortality. Thank you.  Possible Clinical Conditions? -  Svere Protein Calorie Malnutrition -  Severe Calorie Malnutrition -  Cachexia   -  Other Condition (please specify)  Thank You, Beverley Fiedler ,RN Clinical Documentation Specialist:  [phone]  [facility]- Health Information Management

## 2013-07-12 NOTE — Progress Notes (Signed)
Clinical Social Work Department BRIEF PSYCHOSOCIAL ASSESSMENT 07/12/2013  Patient:  George [name], George [name]     Account Number:  [id number]     Admit date:  07/22/2013  Clinical Social Worker:  Jacelyn Grip  Date/Time:  07/12/2013 12:00 N  Referred by:  Physician  Date Referred:  07/12/2013 Referred for  SNF Placement   Other Referral:   Interview type:  Patient Other interview type:   and patient wife at bedside    PSYCHOSOCIAL DATA Living Status:  WIFE Admitted from facility:   Level of care:   Primary support name:  George [name]/wife/[id number] Primary support relationship to patient:  SPOUSE Degree of support available:   strong    CURRENT CONCERNS Current Concerns  Post-Acute Placement   Other Concerns:    SOCIAL WORK ASSESSMENT / PLAN CSW received referral for New SNF.    CSW met with pt and pt wife at bedside. Pt oriented to person and place, but appears to be confused during assessment. CSW introduced self and explained role. CSW discussed that PT/OT recommending SNF placement. Pt wife agreeable to initiation of SNF search in [facility].    CSW completed FL2 and initiated SNF search to [facility].    CSW noted that per MD note, Patient with poor oral intake and probable failure to thrive.Patient's oncologist is currently out of town and will return back tomorrow. Patient's oncologist will likely reassess patient tomorrow to decide on patient's prognosis and as to whether palliative care might be the right direction to go at this time. Will defer decisions of prognosis to patient's oncologist. CSW will follow up on these recommendations as well.    CSW to continue to follow and assist with pt discharge planning needs.   Assessment/plan status:  Psychosocial Support/Ongoing Assessment of Needs Other assessment/ plan:   discharge planning   Information/referral to community resources:   [facility] list    PATIENT'S/FAMILY'S RESPONSE TO PLAN OF  CARE: Pt alert and oriented to person and place, but displays confusion during assessment. Pt wife recognizes that she is unable to continue to care for pt at home and pt will need placement following hospitalization.     George [name], MSW, LCSWA  Clinical Social Work [phone]

## 2013-07-13 ENCOUNTER — Inpatient Hospital Stay (HOSPITAL_COMMUNITY): Payer: Medicare Other

## 2013-07-13 DIAGNOSIS — C7931 Secondary malignant neoplasm of brain: Secondary | ICD-10-CM

## 2013-07-13 DIAGNOSIS — C7800 Secondary malignant neoplasm of unspecified lung: Secondary | ICD-10-CM

## 2013-07-13 DIAGNOSIS — R7309 Other abnormal glucose: Secondary | ICD-10-CM

## 2013-07-13 DIAGNOSIS — G9340 Encephalopathy, unspecified: Secondary | ICD-10-CM

## 2013-07-13 LAB — GLUCOSE, CAPILLARY
Glucose-Capillary: 67 mg/dL — ABNORMAL LOW (ref 70–99)
Glucose-Capillary: 100 mg/dL — ABNORMAL HIGH (ref 70–99)
Glucose-Capillary: 78 mg/dL (ref 70–99)

## 2013-07-13 LAB — CBC
HCT: 29.6 % — ABNORMAL LOW (ref 39.0–52.0)
MCH: 28.7 pg (ref 26.0–34.0)
MCHC: 33.1 g/dL (ref 30.0–36.0)
MCV: 86.5 fL (ref 78.0–100.0)
Platelets: 137 10*3/uL — ABNORMAL LOW (ref 150–400)
RBC: 3.42 MIL/uL — ABNORMAL LOW (ref 4.22–5.81)
RDW: 21.9 % — ABNORMAL HIGH (ref 11.5–15.5)
WBC: 3.2 10*3/uL — ABNORMAL LOW (ref 4.0–10.5)

## 2013-07-13 LAB — RENAL FUNCTION PANEL
Albumin: 2.2 g/dL — ABNORMAL LOW (ref 3.5–5.2)
BUN: 59 mg/dL — ABNORMAL HIGH (ref 6–23)
Calcium: 7.1 mg/dL — ABNORMAL LOW (ref 8.4–10.5)
Chloride: 93 mEq/L — ABNORMAL LOW (ref 96–112)
Creatinine, Ser: 3.21 mg/dL — ABNORMAL HIGH (ref 0.50–1.35)
GFR calc non Af Amer: 19 mL/min — ABNORMAL LOW (ref >90)
Phosphorus: 7.3 mg/dL — ABNORMAL HIGH (ref 2.3–4.6)
Potassium: 5.6 mEq/L — ABNORMAL HIGH (ref 3.5–5.1)

## 2013-07-13 MED ORDER — SODIUM POLYSTYRENE SULFONATE 15 GM/60ML PO SUSP
15.0000 g | Freq: Once | ORAL | Status: DC
  Filled 2013-07-13: qty 60

## 2013-07-13 MED ORDER — ALBUMIN HUMAN 25 % IV SOLN
50.0000 g | Freq: Once | INTRAVENOUS | Status: AC
  Administered 2013-07-13: 50 g via INTRAVENOUS
  Filled 2013-07-13: qty 200

## 2013-07-13 MED ORDER — DEXTROSE 50 % IV SOLN
25.0000 mL | INTRAVENOUS | Status: DC | PRN
  Administered 2013-07-13: 25 mL via INTRAVENOUS
  Filled 2013-07-13: qty 50

## 2013-07-13 NOTE — Progress Notes (Signed)
Saw pt's brother in the hallway. He was tearful stating that the doctor had just told him that his brother did not have long to live. Emotional support provided. Chaplain notified.   Levon Hedger MS, RD, LDN [phone] Pager [phone] After Hours Pager

## 2013-07-13 NOTE — Progress Notes (Signed)
Hospital course to date reviewed. Unfortunate 67 year old man well-known to me. He has widely metastatic renal cell carcinoma initially presenting in August of this year with seizures secondary to brain metastases. He has a very large primary tumor in the right kidney. In addition to the brain disease, he had lung metastases at diagnosis. He  initially received cranial radiation.he was then started on a trial of oral Sutent in October. He was only able to complete one 6 week cycle before he developed progressive abdominal swelling and discomfort with findings of malignant ascites. CT scan consistent with peritoneal and mesenteric spread of cancer. Just prior to this admission, he required frequent and repetitive paracentesis procedures during which large volumes of fluid were removed. This may have been the proximate cause of his decline in renal function exacerbated by decreased oral intake. He presented on the day of the current admission December 7 with progressive weakness, inanition, marked hypoglycemia and recurrent ascites. Condition has continued to decline in the hospital.  On exam: Blood pressure 97/57, pulse 87 regular, respirations 20, temperature 96.8 axillary He is arousable but not verbally communicative PERRLA. Lungs overall clear but incomplete exam. Regular cardiac rhythm no murmur Abdomen markedly distended with positive fluid wave. Not grossly tender. Palpable renal mass in the right flank. Extremities 1+ edema Neurologic: Unable to cooperate with exam.  Impression: Widely metastatic and rapidly progressive clear cell carcinoma of the kidney now further complicated by refractory malignant ascites and acute renal failure.  Prognosis is poor at this point. I discussed his overall situation with his wife and his brother who are present  this morning. I do not feel that we have any therapeutic options that will get his cancer under control. We discussed end-of-life issues. He does  have a living will. Family does not want his life prolonged by unnecessary or mechanical means. This includes chest compressions, ventilators, or dialysis. I reassured the family that we would do everything possible to keep him comfortable. I do not feel that he will survive this admission. If his condition does stabilize, I discussed [facility] facility as an option. For now I would keep him in the hospital.  Discussed above with hospital attending physician

## 2013-07-13 NOTE — Progress Notes (Addendum)
[facility] PROGRESS NOTE  George [name] ZOX:[id number] DOB: [birth year] DOA: 07/15/2013 PCP:  Duane Lope, MD  Brief narrative: 67 y.o. 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 In addition, pt had abdominal distention concerning for reaccumulating ascites. Pt underwent paracentesis 12/7 with 4.9 L fluid drained and will undergo paracentesis 07/13/13.   Assessment and Plan:  Principal Problem: Progressive failure to thrive and dehydration in the setting of advanced renal cell carcinoma - secondary to renal cell carcinoma and brain metastases, poor mental status - appreciate oncology following and discussing code status with the family - will continue to provide supportive care for now - possible residential hospice on discharge once pt stable   Active Problems: Hyponatremia  - likely due to intravascular volume contraction versus interstitial volume overload or hepatorenal syndrome - renal US showed renal mass on the right 9 x 8 x 4 cm and right renal cyst but no hydronephrosis. - had dose of lasix 40 mg x once IV but still with significant pedal edema - will go for another paracentesis today - creatinine is trending up - appreciate renal following Malignant acites - Status post ultrasound-guided paracentesis by interventional radiology 07/10/13 with 4.9 L of fluid removed. Will attempt another abdominal tap today. Renal cell carcinoma with metastases to the brain and lungs - Continue Decadron and Keppra for seizure prophylaxis - appreciate oncology following  Acute renal failure - volume overload or volume contraction - creatinine trending up - appreciate renal following - renal US with findings of  renal mass on the right 9 x 8 x 4 cm and right renal cyst but no hydronephrosis. Hyperkalemia  - secondary to acute renal  failure  - will give kayexalate again today  Seizure disorder  - Secondary to metastatic renal cell carcinoma to the brain. Stable. No seizures noted. Continue Keppra and decadron Diabetes mellitus  - CBG's in past 24 hours: 69, 111, 88 Hyperlipidemia  - Continue statin therapy.  Severe protein calorie malnutrition  - seen by dietician Constipation  - Soapsuds enema as needed. Place on MiraLAX twice daily and Senokot tablet.  Prophylaxis  - Protonix for GI prophylaxis. SCDs for DVT prophylaxis.   Code Status: DNR/DNI Family Communication: family at the bedside Disposition Plan: remains inpatient   Manson Passey, MD  [facility] Pager [phone]  If 7PM-7AM, please contact night-coverage [email or web address] Password [email or web address] 07/13/2013, 10:36 AM   LOS: 4 days   Consultants:  Oncology (Dr. Cyndie Chime)  Nephrology (Dr. Allena Katz)  Procedures:  Paracentesis  07/10/13 and 07/13/13  Antibiotics:  None   HPI/Subjective: Pt sleeping, no acute overnight events.   Objective: Filed Vitals:   07/12/13 1400 07/12/13 1745 07/12/13 2010 07/13/13 0649  BP: 115/80 111/63 122/60 97/57  Pulse: 91 81 90 87  Temp: 97.6 F (36.4 C) 97.6 F (36.4 C) 99 F (37.2 C) 96.8 F (36 C)  TempSrc: Axillary Oral Oral Axillary  Resp: 18 18 20 20   Height:      Weight:      SpO2: 97% 98% 93% 96%    Intake/Output Summary (Last 24 hours) at 07/13/13 1036 Last data filed at 07/13/13 0445  Gross per 24 hour  Intake  848.5 ml  Output    129 ml  Net  719.5 ml    Exam:   General:  Pt is sleeping, not in acute distress  Cardiovascular: Regular  rate and rhythm, S1/S2 appreciated  Respiratory: Clear to auscultation bilaterally, no wheezing  Abdomen: distended, no tenderness on exam  Extremities: LE +3 pitting edema, pulses DP and PT palpable bilaterally  Neuro: Grossly nonfocal  Data Reviewed: Basic Metabolic Panel:  Recent Labs Lab 07/15/2013 2134 07/11/13 0426 07/12/13 0925  07/12/13 1817 07/13/13 0806  NA 130* 127* 127* 127* 126*  K 5.0 5.5* 6.1* 5.4* 5.6*  CL 100 96 95* 95* 93*  CO2 21 20 15* 15* 16*  GLUCOSE 39* 130* 126* 103* 87  BUN 29* 34* 48* 53* 59*  CREATININE 1.01 1.53* 2.41* 2.66* 3.21*  CALCIUM 7.7* 7.2* 6.8* 7.3* 7.1*  PHOS  --   --   --   --  7.3*   Liver Function Tests:  Recent Labs Lab 07/13/2013 2134 07/11/13 0426 07/13/13 0806  AST 19 27  --   ALT 15 14  --   ALKPHOS 57 52  --   BILITOT 0.2* 0.3  --   PROT 4.4* 4.1*  --   ALBUMIN 1.8* 1.7* 2.2*    Recent Labs Lab 07/17/2013 2134  LIPASE 7*   No results found for this basename: AMMONIA,  in the last 168 hours CBC:  Recent Labs Lab 07/31/2013 2134 07/11/13 0426 07/12/13 0820 07/13/13 0806  WBC 3.5* 3.5* 3.6* 3.2*  NEUTROABS 3.0  --   --   --   HGB 11.1* 12.0* 11.3* 9.8*  HCT 34.6* 36.0* 34.2* 29.6*  MCV 87.8 87.4 87.7 86.5  PLT 168 156 172 137*   Cardiac Enzymes: No results found for this basename: CKTOTAL, CKMB, CKMBINDEX, TROPONINI,  in the last 168 hours BNP: No components found with this basename: POCBNP,  CBG:  Recent Labs Lab 07/12/13 2014 07/13/13 0419 07/13/13 0525 07/13/13 0642 07/13/13 0757  GLUCAP 92 67* 69* 111* 88    No results found for this or any previous visit (from the past 240 hour(s)).   Studies: US Renal 07/12/2013   IMPRESSION: History of renal cell carcinoma and right renal mass. There is a lobulated solid mass involving the mid to lower portion of the right kidney. On the previous CT examination the mass measured 11 x 9 cm. On the current study it measures 9 x 8 x 4 cm. Right renal cyst.  No evidence of hydronephrosis.  No left renal lesion demonstrated.  Ascites.  Bladder drained by Foley catheter.      Scheduled Meds: . atorvastatin  40 mg Oral q1800  . dexamethasone  4 mg Intravenous Q12H  . levETIRAcetam  1,000 mg Oral BID  . omega-3 acid ethyl es  1 g Oral Daily  . pantoprazole  40 mg Oral Daily  . polyethylene glycol  17  g Oral BID  . senna-docusate  2 tablet Oral QHS  . sodium bicarbonate  650 mg Oral BID

## 2013-07-13 NOTE — Progress Notes (Signed)
Patient ID: George [name], male   DOB: 1946/05/13, 67 y.o.   MRN: [id number]   [facility] Progress Note    Assessment/ Plan:   1. Acute renal failure: based on labs and clinical examination-this appears to be from severe splanchnic vasoconstriction and peripheral vasodilation +/- ATN. Attempting IV albumin as a means for intravascular expansion without tipping him into respiratory failure. He also had a paracentesis>5L today. Overall prognosis as expressed by Dr.Granfortuna appears exceedingly poor and we will try to limit further injury. 2. Hyperkalemia: Due to AKI- limiting potassium intake and will get a repeat dose of SPE today. 3. Hyponatremia: Hypervolemic on exam but intravascularly depleted- limit PO intake.  4. Anion gap metabolic acidosis:. From AKI, on PO sodium bicarbonate for buffering and avoidance of resipiratory compromise 5. Hyperphosphatemic: from AKI, no role for binders (will not recheck)   Subjective:   Hypoglycemic overnight- Dr.Granfortuna's note reviewed. Wife and brother by bedside.   Objective:   BP 100/59  Pulse 87  Temp(Src) 96.8 F (36 C) (Axillary)  Resp 20  Ht 6\' 2"  (1.88 m)  Wt 104.327 kg (230 lb)  BMI 29.52 kg/m2  SpO2 96%  Intake/Output Summary (Last 24 hours) at 07/13/13 1237 Last data filed at 07/13/13 0445  Gross per 24 hour  Intake  448.5 ml  Output    129 ml  Net  319.5 ml   Weight change:   Physical Exam: WUJ:WJXBJYNWGNF resting in bed after paracentesis AOZ:HYQMV RRR, normal s1 and s2 Resp:Fine rales right base, otherwise CTA HQI:ONGE, distended, BS normal Ext:2-3+ edema  Imaging: US Renal  07/12/2013   CLINICAL DATA:  History of acute renal failure. History of metastatic renal cell carcinoma. History of hypertension and diabetes. History of right renal mass.  EXAM: RENAL/URINARY TRACT ULTRASOUND COMPLETE  COMPARISON:  CT 06/24/2013  FINDINGS: Right Kidney:  Length: Right renal length is 12.5 cm. There is a  lobulated solid mass involving the mid to lower portion of the right kidney. On the previous CT examination the mass measured 11 x 9 cm. On the current study it measures 9 x 8 x 4 cm. Echogenicity within normal limits. There is a lower pole cyst measuring 1.7 x 2.3 x 1.7 cm. Low intensity internal echoes may reflect debris or artifact. No calculus, or hydronephrosis visualized.  Left Kidney:  Length: Left renal length is 12.7 cm. Echogenicity within normal limits. No mass or hydronephrosis visualized.  Bladder:  Urinary bladder is drained by a Foley catheter.  There is ascites.  IMPRESSION: History of renal cell carcinoma and right renal mass. There is a lobulated solid mass involving the mid to lower portion of the right kidney. On the previous CT examination the mass measured 11 x 9 cm. On the current study it measures 9 x 8 x 4 cm. Right renal cyst.  No evidence of hydronephrosis.  No left renal lesion demonstrated.  Ascites.  Bladder drained by Foley catheter.   Electronically Signed   By: Onalee Hua  Call M.D.   On: 07/12/2013 14:26    Labs: BMET  Recent Labs Lab 08/02/2013 2134 07/11/13 0426 07/12/13 0925 07/12/13 1817 07/13/13 0806  NA 130* 127* 127* 127* 126*  K 5.0 5.5* 6.1* 5.4* 5.6*  CL 100 96 95* 95* 93*  CO2 21 20 15* 15* 16*  GLUCOSE 39* 130* 126* 103* 87  BUN 29* 34* 48* 53* 59*  CREATININE 1.01 1.53* 2.41* 2.66* 3.21*  CALCIUM 7.7* 7.2* 6.8* 7.3* 7.1*  PHOS  --   --   --   --  7.3*   CBC  Recent Labs Lab 07/10/2013 2134 07/11/13 0426 07/12/13 0820 07/13/13 0806  WBC 3.5* 3.5* 3.6* 3.2*  NEUTROABS 3.0  --   --   --   HGB 11.1* 12.0* 11.3* 9.8*  HCT 34.6* 36.0* 34.2* 29.6*  MCV 87.8 87.4 87.7 86.5  PLT 168 156 172 137*   Medications:    . atorvastatin  40 mg Oral q1800  . dexamethasone  4 mg Intravenous Q12H  . feeding supplement (ENSURE COMPLETE)  237 mL Oral TID WC  . levETIRAcetam  1,000 mg Oral BID  . omega-3 acid ethyl esters  1 g Oral Daily  . pantoprazole  40  mg Oral Daily  . polyethylene glycol  17 g Oral BID  . senna-docusate  2 tablet Oral QHS  . sodium bicarbonate  650 mg Oral BID  . sodium polystyrene  15 g Oral Once   Zetta Bills, MD 07/13/2013, 12:37 PM

## 2013-07-13 NOTE — Progress Notes (Signed)
After D50 25 MLS CBG 111, WILL CONTINUE TO MONITOR, Chayil Gantt, RN

## 2013-07-13 NOTE — Progress Notes (Signed)
Patient letargic all day long, arousable but not able to take anything PO , family remained at bedside.

## 2013-07-13 NOTE — Procedures (Signed)
Successful US guided paracentesis from RLQ.  Yielded 5.5L of dark amber fluid.  No immediate complications.  Pt tolerated well. BP remained stable throughout  Specimen was not sent for labs.  Brayton El PA-C 07/13/2013 12:24 PM

## 2013-07-13 NOTE — Progress Notes (Signed)
CSW met with pt wife and pt brother at bedside.   Pt wife discussed that MD delivered difficult new to pt family today as pt prognosis is very poor.  Pt wife discussed that they are doing "fine" right now and simply trying to process the information. Pt wife discussed that she feels pt is comfortable and pt wife and pt brother are thankful for that.   CSW offered emotional support and counseling to pt wife and pt brother.   CSW discussed that per MD, if pt condition stabilizes then MD may recommend to explore residential hospice placement. CSW explained that CSW will assist with this if MD feels that it is appropriate.  Pt wife and pt brother appreciative of CSW visit and support and had visitors entering at the completion of this CSW visit.   CSW to continue to follow to provide support and assist with residential hospice placement if appropriate.  Jacklynn Lewis, MSW, LCSWA  Clinical Social Work [phone]

## 2013-07-13 NOTE — Progress Notes (Signed)
CBG THIS AM 67, PT AWAKE, ASYMPTOMATIC, GLUCOSE GEL GIVEN AS ORDERED,WILL MONITOR, Raygen Linquist, RN

## 2013-07-13 NOTE — Progress Notes (Signed)
Visited as a follow up to daytime chaplain service. Found family surrounding George [name] and providing emotional and spiritual support to him. The family says they are at peace presently after visits from [employer]. Prayer with George [name].  Follow up as required  Benjie Karvonen. Mikala Podoll, DMin Chaplain

## 2013-07-13 NOTE — Progress Notes (Signed)
cbg only up to 69 after glucose gel, called house coverage md, gave d50 25ml, will monitor, Tarisa Paola, rn

## 2013-07-13 NOTE — Progress Notes (Signed)
Wife Kathie Rhodes and patient's brother in the room. Offered support but wife said she thought they were "fine" right now. She then mentioned that she knew a chaplain who worked at [employer], [employer]. I offered to call Tommie and she requested that I do this, saying "Yes. I would like her company. I'm feeling so overwhelmed right now." I called Tommie and she is coming. Will continue to follow and support.

## 2013-07-14 ENCOUNTER — Ambulatory Visit (HOSPITAL_COMMUNITY): Payer: Medicare Other

## 2013-07-14 ENCOUNTER — Other Ambulatory Visit (HOSPITAL_COMMUNITY): Payer: Medicare Other

## 2013-07-14 DIAGNOSIS — G40802 Other epilepsy, not intractable, without status epilepticus: Secondary | ICD-10-CM

## 2013-07-14 LAB — GLUCOSE, CAPILLARY
Glucose-Capillary: 117 mg/dL — ABNORMAL HIGH (ref 70–99)
Glucose-Capillary: 152 mg/dL — ABNORMAL HIGH (ref 70–99)
Glucose-Capillary: 152 mg/dL — ABNORMAL HIGH (ref 70–99)

## 2013-07-14 MED ORDER — PANTOPRAZOLE SODIUM 40 MG IV SOLR
40.0000 mg | INTRAVENOUS | Status: DC
  Administered 2013-07-14: 40 mg via INTRAVENOUS
  Filled 2013-07-14 (×2): qty 40

## 2013-07-14 MED ORDER — SCOPOLAMINE 1 MG/3DAYS TD PT72
1.0000 | MEDICATED_PATCH | TRANSDERMAL | Status: DC
  Administered 2013-07-14: 1.5 mg via TRANSDERMAL
  Filled 2013-07-14: qty 1

## 2013-07-14 MED ORDER — FUROSEMIDE 10 MG/ML IJ SOLN
20.0000 mg | Freq: Once | INTRAMUSCULAR | Status: AC
  Administered 2013-07-14: 20 mg via INTRAVENOUS
  Filled 2013-07-14: qty 2

## 2013-07-14 MED ORDER — SODIUM CHLORIDE 0.9 % IV SOLN
1000.0000 mg | Freq: Two times a day (BID) | INTRAVENOUS | Status: DC
  Administered 2013-07-14: 1000 mg via INTRAVENOUS
  Filled 2013-07-14 (×3): qty 10

## 2013-07-16 NOTE — Discharge Summary (Signed)
  Death Summary  George [name] VWU:[id number] DOB: 14-Jun-1946 DOA: 2013-08-07  PCP:  Duane Lope, MD PCP/Office notified  Admit date: 08-07-13 Date of Death: 08/14/13  Final Diagnoses:  Principal Problem:   Weakness generalized Active Problems:   Diabetes mellitus   Hyperlipidemia   Seizure   CAD (coronary artery disease)   Lung metastases   Metastatic renal cell carcinoma to brain   Malignant ascites   Dehydration   Hypoglycemia   Hyponatremia   Elevated serum creatinine   FTT (failure to thrive) in adult   Protein-calorie malnutrition, severe   Hyperkalemia   ARF (acute renal failure)   Vasogenic cerebral edema  67 y.o. 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 In addition, pt had abdominal distention concerning for reaccumulating ascites. Pt underwent paracentesis 12/7 with 4.9 L fluid drained and then 07/13/13 with 5.5 L fluid drained.   Assessment and Plan:   Principal Problem:  Progressive failure to thrive and dehydration in the setting of advanced renal cell carcinoma  - secondary to renal cell carcinoma and brain metastases, poor mental status   Active Problems:  Hyponatremia  - likely due to intravascular volume contraction versus interstitial volume overload or hepatorenal syndrome  - renal US showed renal mass on the right 9 x 8 x 4 cm and right renal cyst but no hydronephrosis.  - had dose of lasix 40 mg x once IV, no significant improvement in LE edema  - creatinine trending up; renal was following  Malignant acites  - Status post ultrasound-guided paracentesis 12/7 and 12/10  Renal cell carcinoma with metastases to the brain and lungs  - Continued Decadron and Keppra for seizure prophylaxis; changed to IV medications  - appreciate oncology following  Acute renal failure  - volume overload or  volume contraction  - creatinine trending up  - renal US with findings of renal mass on the right 9 x 8 x 4 cm and right renal cyst but no hydronephrosis.  Hyperkalemia  - secondary to acute renal failure  - given kayexalate x once Seizure disorder  - Secondary to metastatic renal cell carcinoma to the brain. No seizures noted. Was on Keppra and decadron  Diabetes mellitus  - did not have good PO intake so no sliding scale insulin ordered Hyperlipidemia  - Was on statin therapy.  Severe protein calorie malnutrition  - seen by dietician  Constipation  - Soapsuds enema were used as needed. Placed on MiraLAX twice daily and Senokot tablet.  Prophylaxis  - Protonix was used for GI prophylaxis. SCDs for DVT prophylaxis.   Code Status: DNR/DNI  Family Communication: family at the bedside   Consultants:  Oncology (Dr. Cyndie Chime)  Nephrology (Dr. Allena Katz)  IR Procedures:  Paracentesis 07/10/13 and 07/13/13 Antibiotics:  None     Time: 22:15; 07/15/2013  Signed:  Manson Passey  [facility] 08-14-13, 9:30 PM

## 2013-07-18 ENCOUNTER — Ambulatory Visit (HOSPITAL_COMMUNITY): Payer: Medicare Other

## 2013-07-18 ENCOUNTER — Other Ambulatory Visit (HOSPITAL_COMMUNITY): Payer: Medicare Other

## 2013-07-19 ENCOUNTER — Ambulatory Visit: Payer: Medicare Other

## 2013-07-20 ENCOUNTER — Other Ambulatory Visit (HOSPITAL_COMMUNITY): Payer: Medicare Other

## 2013-07-21 ENCOUNTER — Other Ambulatory Visit (HOSPITAL_COMMUNITY): Payer: Medicare Other

## 2013-07-21 ENCOUNTER — Ambulatory Visit (HOSPITAL_COMMUNITY): Payer: Medicare Other

## 2013-07-25 ENCOUNTER — Other Ambulatory Visit (HOSPITAL_COMMUNITY): Payer: Medicare Other

## 2013-07-25 ENCOUNTER — Ambulatory Visit (HOSPITAL_COMMUNITY): Payer: Medicare Other

## 2013-07-29 ENCOUNTER — Ambulatory Visit (HOSPITAL_COMMUNITY): Payer: Medicare Other

## 2013-08-02 ENCOUNTER — Ambulatory Visit (HOSPITAL_COMMUNITY): Payer: Medicare Other

## 2013-08-04 NOTE — Progress Notes (Signed)
Pt. Family called RN into the room.  Family stated they noticed in pt. Breathing.  RN assessed pt. And found pt. To have congested lung sounds in all fields in both lungs. MD was notified and new orders were given. Will continue to monitor pt.

## 2013-08-04 NOTE — Progress Notes (Addendum)
[facility] PROGRESS NOTE  George [name] OZH:[id number] DOB: 08/15/1945 DOA: 08/03/2013 PCP:  Duane Lope, MD  Brief narrative: 68 y.o. 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 In addition, pt had abdominal distention concerning for reaccumulating ascites. Pt underwent paracentesis 12/7 with 4.9 L fluid drained and then 07/13/13 with 5.5 L fluid drained.   Assessment and Plan:   Principal Problem:  Progressive failur to thrive and dehydration in the setting of advanced renal cell carcinoma  - secondary to renal cell carcinoma and brain metastases, poor mental status  - appreciate oncology following - will continue to provide supportive care; change meds to IV if at all possible - possible residential hospice on discharge once pt stable  Active Problems:  Hyponatremia  - likely due to intravascular volume contraction versus interstitial volume overload or hepatorenal syndrome  - renal US showed renal mass on the right 9 x 8 x 4 cm and right renal cyst but no hydronephrosis.  - had dose of lasix 40 mg x once IV, no significant improvement in LE edema - creatinine is trending up; renal is following Malignant acites  - Status post ultrasound-guided paracentesis 12/7 and 12/10 Renal cell carcinoma with metastases to the brain and lungs  - Continue Decadron and Keppra for seizure prophylaxis; change to IV medications - appreciate oncology following  Acute renal failure  - volume overload or volume contraction  - creatinine trending up - renal US with findings of renal mass on the right 9 x 8 x 4 cm and right renal cyst but no hydronephrosis.  Hyperkalemia  - secondary to acute renal failure  - will give kayexalate again today  Seizure disorder  - Secondary to metastatic renal cell carcinoma to the brain. Stable. No seizures  noted. Continue Keppra and decadron  Diabetes mellitus  - does not have good PO intake so no sliding scale insulin  Hyperlipidemia  - Continue statin therapy.  Severe protein calorie malnutrition  - seen by dietician  Constipation  - Soapsuds enema as needed. Place on MiraLAX twice daily and Senokot tablet.  Prophylaxis  - Protonix for GI prophylaxis. SCDs for DVT prophylaxis.   Code Status: DNR/DNI  Family Communication: family at the bedside  Disposition Plan: remains inpatient   Consultants:  Oncology (Dr. Cyndie Chime)  Nephrology (Dr. Allena Katz) IR Procedures:  Paracentesis 07/10/13 and 07/13/13 Antibiotics:  None    If 7PM-7AM, please contact night-coverage [email or web address] Password [email or web address] 2013-07-23, 6:54 AM   LOS: 5 days    HPI/Subjective: No acute overnight events.   Objective: Filed Vitals:   07/13/13 1223 07/13/13 1247 07/13/13 2010 07/23/13 0414  BP: 100/59 93/50 81/36  118/54  Pulse:  82 85 81  Temp:   97.7 F (36.5 C) 97.2 F (36.2 C)  TempSrc:   Oral Axillary  Resp:  16 20 18   Height:      Weight:      SpO2:  96% 92% 94%    Intake/Output Summary (Last 24 hours) at 07/23/2013 0654 Last data filed at 07/23/13 0415  Gross per 24 hour  Intake 720.83 ml  Output    450 ml  Net 270.83 ml    Exam:   General:  Pt is awake but does not respond to verbal stimaulaton  Cardiovascular: Regular rate and rhythm, S1/S2 appreciated   Respiratory: Clear to auscultation bilaterally, no wheezing  Abdomen: distended,  non tender  Extremities: LE edema, pulses DP and PT palpable bilaterally  Neuro: Grossly nonfocal  Data Reviewed: Basic Metabolic Panel:  Recent Labs Lab 07-28-13 2134 07/11/13 0426 07/12/13 0925 07/12/13 1817 07/13/13 0806  NA 130* 127* 127* 127* 126*  K 5.0 5.5* 6.1* 5.4* 5.6*  CL 100 96 95* 95* 93*  CO2 21 20 15* 15* 16*  GLUCOSE 39* 130* 126* 103* 87  BUN 29* 34* 48* 53* 59*  CREATININE 1.01 1.53* 2.41* 2.66* 3.21*  CALCIUM 7.7*  7.2* 6.8* 7.3* 7.1*  PHOS  --   --   --   --  7.3*   Liver Function Tests:  Recent Labs Lab 07/28/2013 2134 07/11/13 0426 07/13/13 0806  AST 19 27  --   ALT 15 14  --   ALKPHOS 57 52  --   BILITOT 0.2* 0.3  --   PROT 4.4* 4.1*  --   ALBUMIN 1.8* 1.7* 2.2*    Recent Labs Lab 07-28-13 2134  LIPASE 7*   No results found for this basename: AMMONIA,  in the last 168 hours CBC:  Recent Labs Lab 2013/07/28 2134 07/11/13 0426 07/12/13 0820 07/13/13 0806  WBC 3.5* 3.5* 3.6* 3.2*  NEUTROABS 3.0  --   --   --   HGB 11.1* 12.0* 11.3* 9.8*  HCT 34.6* 36.0* 34.2* 29.6*  MCV 87.8 87.4 87.7 86.5  PLT 168 156 172 137*   Cardiac Enzymes: No results found for this basename: CKTOTAL, CKMB, CKMBINDEX, TROPONINI,  in the last 168 hours BNP: No components found with this basename: POCBNP,  CBG:  Recent Labs Lab 07/13/13 1242 07/13/13 1553 07/13/13 2009 07/23/2013 0056 07/27/2013 0409  GLUCAP 100* 78 86 117* 132*    No results found for this or any previous visit (from the past 240 hour(s)).   Studies: US Renal 07/12/2013     IMPRESSION: History of renal cell carcinoma and right renal mass. There is a lobulated solid mass involving the mid to lower portion of the right kidney. On the previous CT examination the mass measured 11 x 9 cm. On the current study it measures 9 x 8 x 4 cm. Right renal cyst.  No evidence of hydronephrosis.  No left renal lesion demonstrated.  Ascites.  Bladder drained by Foley catheter.   Electronically Signed   By: Onalee Hua  Call M.D.   On: 07/12/2013 14:26   US Paracentesis 07/13/2013   IMPRESSION: Successful ultrasound guided paracentesis yielding 5.5 L of ascites.  Read by: Brayton El PA-C   Electronically Signed   By: Ruel Favors M.D.   On: 07/13/2013 12:25    Scheduled Meds: . atorvastatin  40 mg Oral q1800  . dexamethasone  4 mg Intravenous Q12H  . levETIRAcetam  1,000 mg Oral BID  . omega-3 acid ethyl  1 g Oral Daily  . pantoprazole  40 mg Oral  Daily  . polyethylene glycol  17 g Oral BID  . senna-docusate  2 tablet Oral QHS  . sodium bicarbonate  650 mg Oral BID  . sodium polystyrene  15 g Oral Once   Continuous Infusions: . sodium chloride 20 mL/hr (07/12/13 1949)  . dextrose 20 mL (07/21/2013 0057)

## 2013-08-04 NOTE — Progress Notes (Signed)
Patient ID: George [name], male   DOB: 09/04/1945, 68 y.o.   MRN: [id number]  The patient continues to show clinical decline and family around him aware of his imminent mortality. Limiting labs draws and intrusive/invasive procedures at this time with the goal being comfort care.  Renal will sign off at this time. Please call with any questions/concerns.  Zetta Bills MD [facility]. Office # [phone] Pager # [phone] 12:32 PM

## 2013-08-04 NOTE — Progress Notes (Signed)
Chaplain was asked by nurse during morning huddle to check on pt.and family. Chaplain visited pt. He was sleeping and his family was with him. They appeared to be in good spirits and denied that they needed any services at the present time . Chaplain advised if further service was needed to have nursing staff contact chaplain.   Cindie Crumbly, [place]

## 2013-08-04 NOTE — Progress Notes (Signed)
Support from Hospital Medicine, Social services and chaplain services greatly appreciated. He is more alert today ; he did appear to recognize me and whisper a few words. Abdomen less tense after paracentesis yesterday. Lungs overall clear.  Urine output 450 ml Impression: #1. 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 Seizure disorder due to brain mets from #1 #3. 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 #4. Acute renal failure, multifactorial, dehydration and fluid shifts from repetitive paracentesis procedures to palliate refractory ascites. Recommend: Continue supportive care. Wife & brother  here. Status reviewed.

## 2013-08-04 NOTE — Progress Notes (Signed)
Pt expired at 2215. Verified by two RN'S. Md notified. Family at the bedside.

## 2013-08-04 NOTE — Progress Notes (Signed)
When first arrived, patient, brother, and sister-in-law in room. Two friends arrived later. Arranged for [place] family to stay in one of the houses the [facility] has available. The family will be able to stay there together. Today someone from the family will need to go to the [facility] at [facility] (3rd floor next to the [facility]) to get the key and sign papers between 2-2:30. No one will be in the office after 3:00 to sign out the key. Someone can also go in the morning if for some reason, family cannot get there this afternoon. While visiting the patient and family the patient's brother began showing signs of physical distress and he has a history of heart disease. Nursing staff took the brother to the ED for evaluation. Will continue to follow.

## 2013-08-04 NOTE — Progress Notes (Signed)
PT Cancellation Note and Discharge  Patient Details Name: George [name] MRN: [id number] DOB: [birth year]   Cancelled Treatment:    Reason Eval/Treat Not Completed: Other (comment) (discharge from PT due to decline and hopsice)  RN reports pt decline and pt no longer appropriate for therapy at this time and with plans for residential hospice.  PT to sign off.    Physical Therapy Discharge Patient Details Name: George [name] MRN: [id number] DOB: 1946-04-27 Today's Date: 08/02/2013 Time:  -     Patient discharged from PT services secondary to medical decline - will need to re-order PT to resume therapy services.  Please see latest therapy progress note for current level of functioning and progress toward goals.      Schawn Byas,KATHrine E 07/28/2013, 12:47 PM Zenovia Jarred, PT, DPT 07/05/2013 Pager: [phone]

## 2013-08-04 DEATH — deceased

## 2013-08-05 ENCOUNTER — Other Ambulatory Visit: Payer: Medicare Other

## 2013-08-05 ENCOUNTER — Ambulatory Visit: Payer: Medicare Other | Admitting: Oncology

## 2013-09-04 DEATH — deceased

## 2014-09-25 IMAGING — CT CT CERVICAL SPINE W/O CM
4 of 5 series · 14 of 33 positions shown, 16 images · non-contrast
Comparison: CT 03/26/1999 is not digitized.  Report is reviewed.

CT HEAD

CLINICAL DATA: Altered mental status and fall

CT HEAD WITHOUT CONTRAST
CT CERVICAL SPINE WITHOUT CONTRAST
TECHNIQUE: Multidetector CT imaging of the head and cervical spine
was performed following the standard protocol without intravenous
contrast.  Multiplanar CT image reconstructions of the cervical
spine were also generated.

[Series 2: coronals · coronal · 0.37mm/px · 3 of 47 slices shown]
[im 10/47  bone]
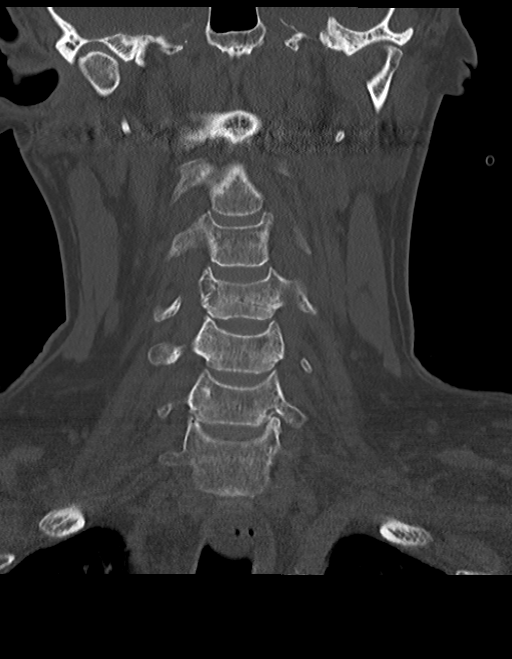
[im 19/47  bone]
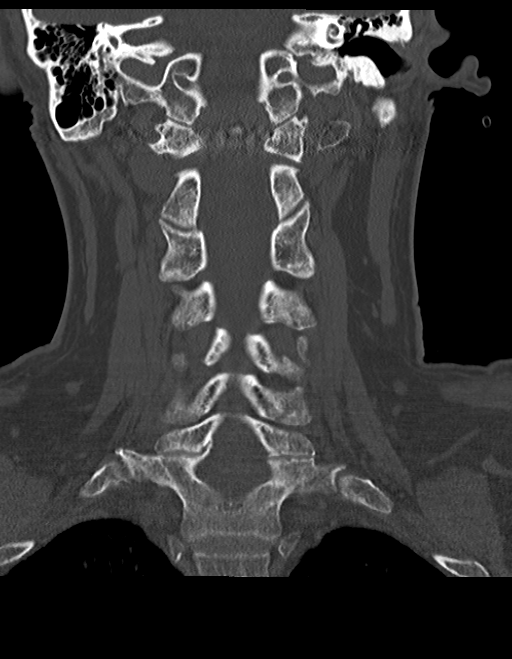
[im 28/47  bone]
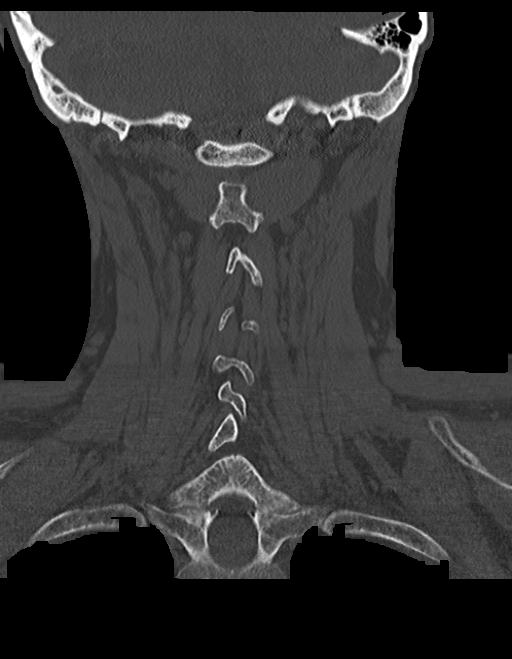

[Series 3: sagittals · sagittal · 0.34mm/px · 5 of 47 slices shown, 6 images]
[im 16/47  bone]
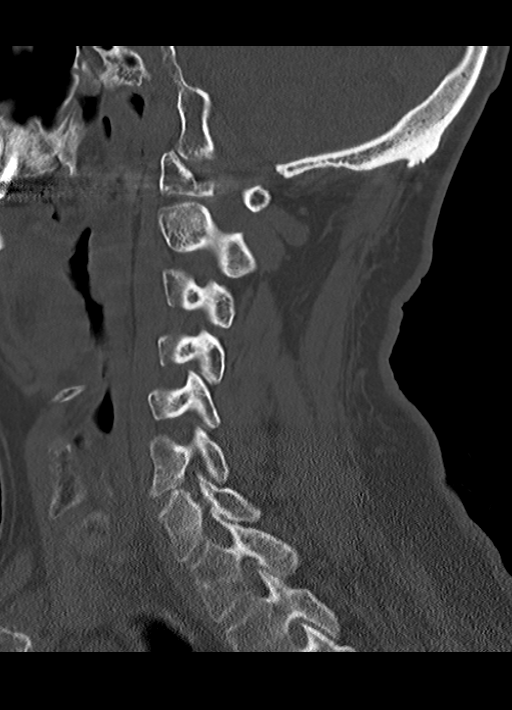
[im 20/47  bone]
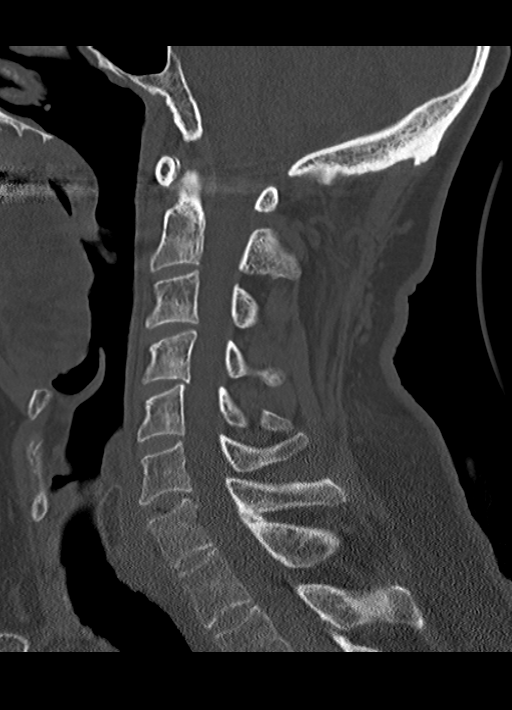
[im 24/47  soft-tissue]
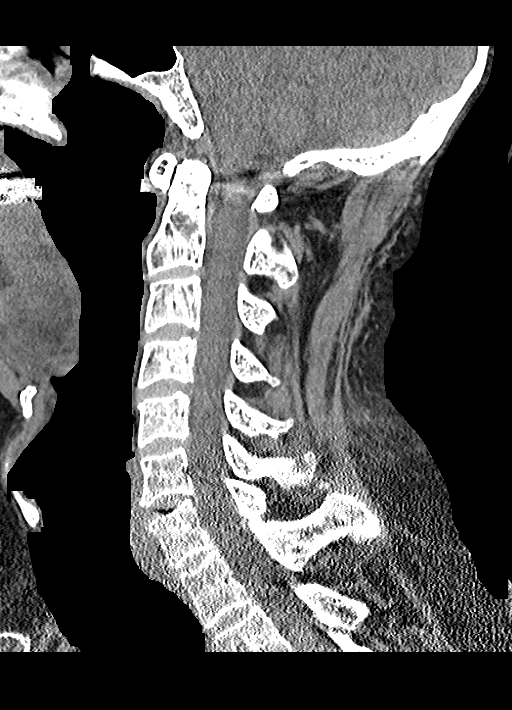
[im 24/47  bone]
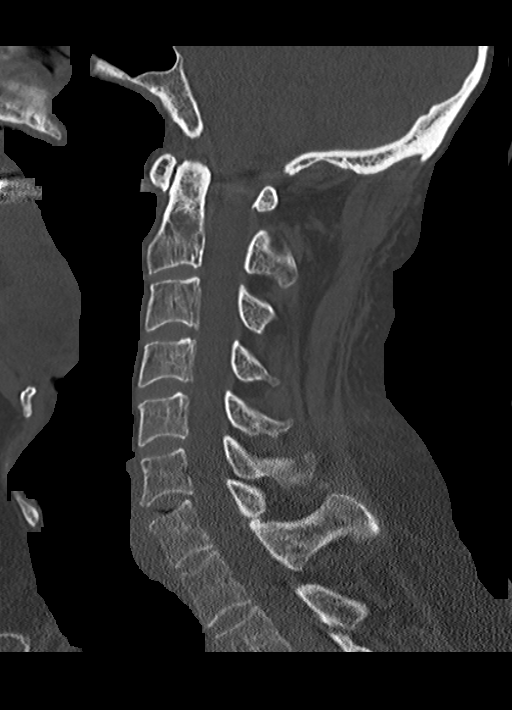
[im 27/47  bone]
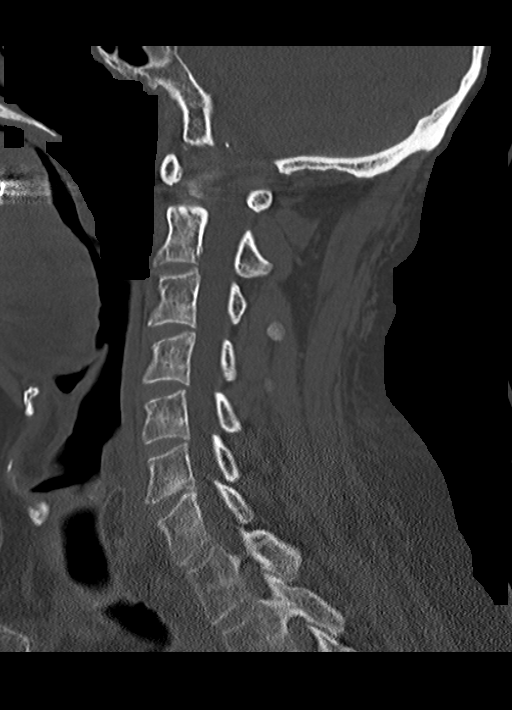
[im 31/47  bone]
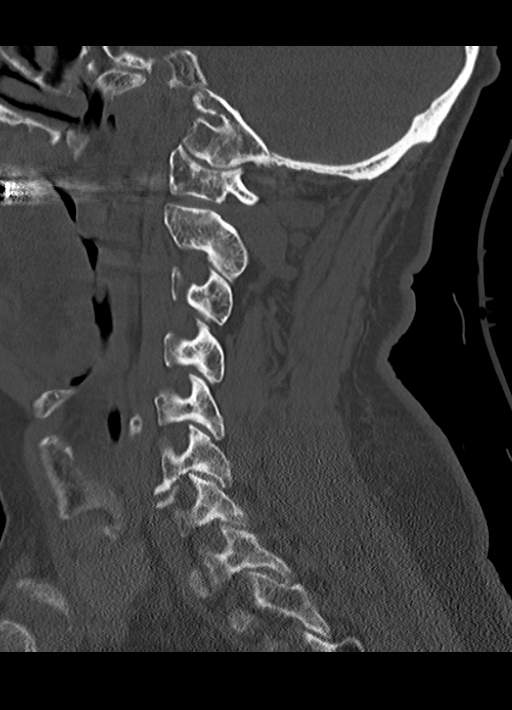

[Series 6: c_spine 2.0 i30s 3 · axial · 0.29mm/px · z∈[-271,-169]mm · 3 of 103 slices shown, 4 images]
[im 26/103  soft-tissue]
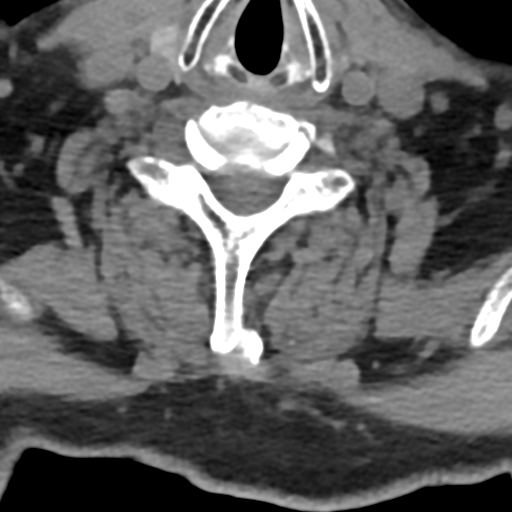
[im 26/103  bone]
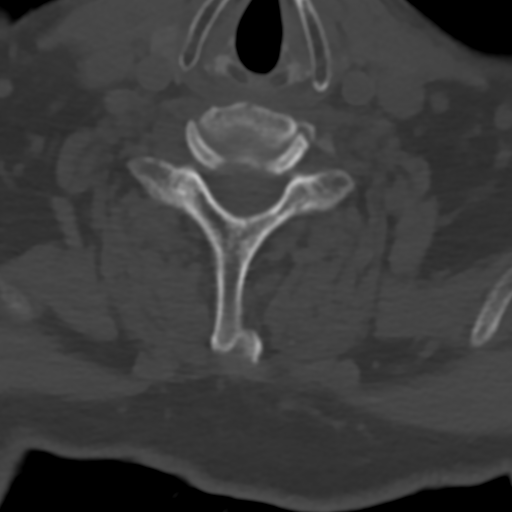
[im 52/103  bone]
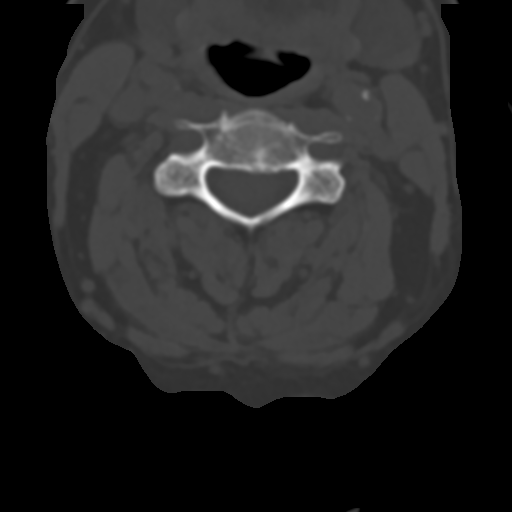
[im 77/103  bone]
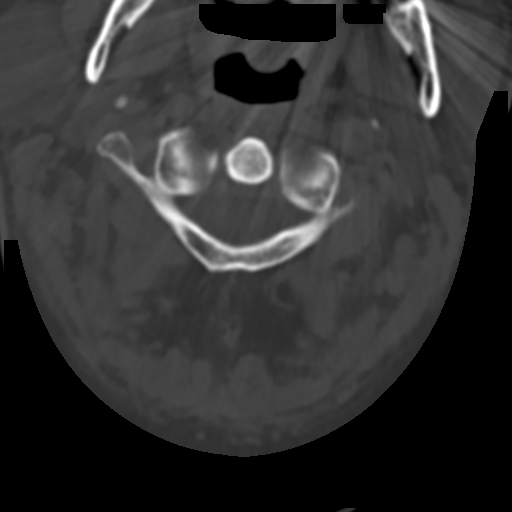

[Series 9: thins · axial · 0.21mm/px · z∈[-290,-197]mm · 3 of 114 slices shown]
[im 29/114  bone]
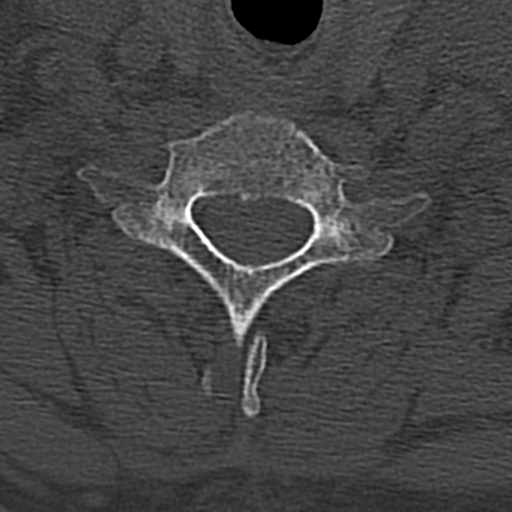
[im 57/114  bone]
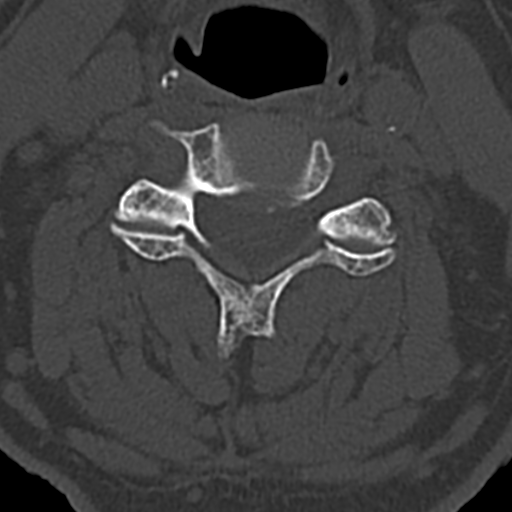
[im 85/114  bone]
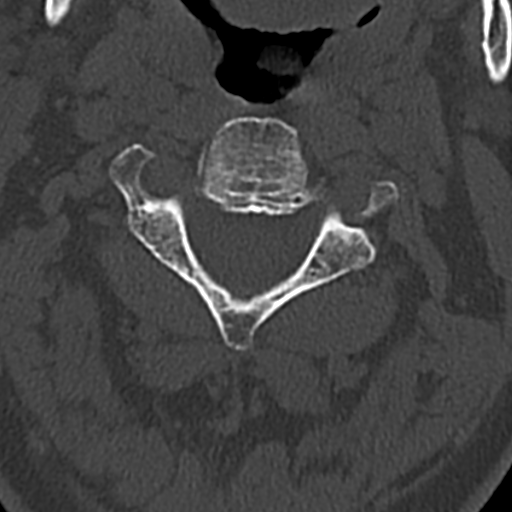

[14 of 33 positions shown; findings below may reference images not displayed]

FINDINGS: There is a large area of abnormal hypo attenuation within
the gray matter and extending to the cortical surface in the left
frontoparietal lobe region.  Remote right caudate lacunar infarct
is re-identified, previously described.  No acute hemorrhage is
seen.  No sulcal effacement.  No midline shift.  No skull fracture.
Orbits and paranasal sinuses are intact.
IMPRESSION: Abnormal area of predominately white matter left frontoparietal
lobe hypodensity, which may indicate acute versus subacute infarct.
Infiltrative neoplasm such as glioblastoma multiforme is possible
but considered less likely given absence of focal effacement.  No
acute hemorrhage.

CT CERVICAL SPINE
FINDINGS: C1 through the cervical thoracic junction is visualized
in its entirety. No precervical soft tissue widening is present.
No fracture or dislocation.  Alignment is normal.
IMPRESSION: No acute cervical spine abnormality.

Critical Value/emergent results were called by telephone at the
time of interpretation on 03/28/2013 at [DATE] p.m. to Dr. Gunther,
who verbally acknowledged these results.
